# Patient Record
Sex: Female | Born: 1945
Health system: Southern US, Community
[De-identification: ages and names within clinical notes are randomized; demographics above are authoritative.]

## PROBLEM LIST (undated history)

## (undated) DIAGNOSIS — F329 Major depressive disorder, single episode, unspecified: Secondary | ICD-10-CM

## (undated) DIAGNOSIS — H409 Unspecified glaucoma: Secondary | ICD-10-CM

## (undated) DIAGNOSIS — E119 Type 2 diabetes mellitus without complications: Secondary | ICD-10-CM

## (undated) DIAGNOSIS — Z83719 Family history of colon polyps, unspecified: Secondary | ICD-10-CM

## (undated) DIAGNOSIS — J45909 Unspecified asthma, uncomplicated: Secondary | ICD-10-CM

## (undated) DIAGNOSIS — K219 Gastro-esophageal reflux disease without esophagitis: Secondary | ICD-10-CM

## (undated) DIAGNOSIS — F32A Depression, unspecified: Secondary | ICD-10-CM

## (undated) DIAGNOSIS — Z8371 Family history of colonic polyps: Secondary | ICD-10-CM

## (undated) DIAGNOSIS — M1990 Unspecified osteoarthritis, unspecified site: Secondary | ICD-10-CM

## (undated) DIAGNOSIS — I10 Essential (primary) hypertension: Secondary | ICD-10-CM

## (undated) HISTORY — DX: Essential (primary) hypertension: I10

## (undated) HISTORY — PX: JOINT REPLACEMENT: SHX530

## (undated) HISTORY — DX: Gastro-esophageal reflux disease without esophagitis: K21.9

## (undated) HISTORY — PX: TUBAL LIGATION: SHX77

## (undated) HISTORY — DX: Family history of colon polyps, unspecified: Z83.719

## (undated) HISTORY — DX: Unspecified osteoarthritis, unspecified site: M19.90

## (undated) HISTORY — DX: Depression, unspecified: F32.A

## (undated) HISTORY — DX: Family history of colonic polyps: Z83.71

## (undated) HISTORY — PX: BUNIONECTOMY: SHX129

## (undated) HISTORY — DX: Unspecified glaucoma: H40.9

## (undated) HISTORY — DX: Major depressive disorder, single episode, unspecified: F32.9

## (undated) HISTORY — PX: CATARACT EXTRACTION W/ INTRAOCULAR LENS IMPLANT: SHX1309

---

## 2013-09-10 HISTORY — PX: SHOULDER ARTHROSCOPY W/ ROTATOR CUFF REPAIR: SHX2400

## 2013-09-10 HISTORY — PX: REPLACEMENT TOTAL KNEE: SUR1224

## 2016-10-23 DIAGNOSIS — M25552 Pain in left hip: Secondary | ICD-10-CM | POA: Diagnosis not present

## 2016-10-23 DIAGNOSIS — M7062 Trochanteric bursitis, left hip: Secondary | ICD-10-CM | POA: Diagnosis not present

## 2016-10-24 DIAGNOSIS — Z6829 Body mass index (BMI) 29.0-29.9, adult: Secondary | ICD-10-CM | POA: Diagnosis not present

## 2016-10-24 DIAGNOSIS — Z713 Dietary counseling and surveillance: Secondary | ICD-10-CM | POA: Diagnosis not present

## 2016-11-19 DIAGNOSIS — H2512 Age-related nuclear cataract, left eye: Secondary | ICD-10-CM | POA: Diagnosis not present

## 2016-11-19 DIAGNOSIS — H2511 Age-related nuclear cataract, right eye: Secondary | ICD-10-CM | POA: Diagnosis not present

## 2016-11-19 DIAGNOSIS — H40013 Open angle with borderline findings, low risk, bilateral: Secondary | ICD-10-CM | POA: Diagnosis not present

## 2016-11-19 DIAGNOSIS — H524 Presbyopia: Secondary | ICD-10-CM | POA: Diagnosis not present

## 2016-12-04 DIAGNOSIS — Z01818 Encounter for other preprocedural examination: Secondary | ICD-10-CM | POA: Diagnosis not present

## 2016-12-04 DIAGNOSIS — Z6829 Body mass index (BMI) 29.0-29.9, adult: Secondary | ICD-10-CM | POA: Diagnosis not present

## 2016-12-04 DIAGNOSIS — Z6832 Body mass index (BMI) 32.0-32.9, adult: Secondary | ICD-10-CM | POA: Diagnosis not present

## 2016-12-04 DIAGNOSIS — Z01810 Encounter for preprocedural cardiovascular examination: Secondary | ICD-10-CM | POA: Diagnosis not present

## 2016-12-04 DIAGNOSIS — Z713 Dietary counseling and surveillance: Secondary | ICD-10-CM | POA: Diagnosis not present

## 2016-12-04 DIAGNOSIS — Z719 Counseling, unspecified: Secondary | ICD-10-CM | POA: Diagnosis not present

## 2016-12-10 DIAGNOSIS — H2512 Age-related nuclear cataract, left eye: Secondary | ICD-10-CM | POA: Diagnosis not present

## 2016-12-10 DIAGNOSIS — H2511 Age-related nuclear cataract, right eye: Secondary | ICD-10-CM | POA: Diagnosis not present

## 2016-12-13 DIAGNOSIS — J029 Acute pharyngitis, unspecified: Secondary | ICD-10-CM | POA: Diagnosis not present

## 2016-12-13 DIAGNOSIS — Z6829 Body mass index (BMI) 29.0-29.9, adult: Secondary | ICD-10-CM | POA: Diagnosis not present

## 2016-12-13 DIAGNOSIS — Z713 Dietary counseling and surveillance: Secondary | ICD-10-CM | POA: Diagnosis not present

## 2016-12-18 DIAGNOSIS — H2511 Age-related nuclear cataract, right eye: Secondary | ICD-10-CM | POA: Diagnosis not present

## 2016-12-19 DIAGNOSIS — Z961 Presence of intraocular lens: Secondary | ICD-10-CM | POA: Diagnosis not present

## 2016-12-26 DIAGNOSIS — Z961 Presence of intraocular lens: Secondary | ICD-10-CM | POA: Diagnosis not present

## 2017-01-16 DIAGNOSIS — H209 Unspecified iridocyclitis: Secondary | ICD-10-CM | POA: Diagnosis not present

## 2017-01-16 DIAGNOSIS — Z961 Presence of intraocular lens: Secondary | ICD-10-CM | POA: Diagnosis not present

## 2017-01-18 DIAGNOSIS — D482 Neoplasm of uncertain behavior of peripheral nerves and autonomic nervous system: Secondary | ICD-10-CM | POA: Diagnosis not present

## 2017-01-18 DIAGNOSIS — E1165 Type 2 diabetes mellitus with hyperglycemia: Secondary | ICD-10-CM | POA: Diagnosis not present

## 2017-01-18 DIAGNOSIS — I8390 Asymptomatic varicose veins of unspecified lower extremity: Secondary | ICD-10-CM | POA: Diagnosis not present

## 2017-01-18 DIAGNOSIS — E1142 Type 2 diabetes mellitus with diabetic polyneuropathy: Secondary | ICD-10-CM | POA: Diagnosis not present

## 2017-01-18 DIAGNOSIS — E7800 Pure hypercholesterolemia, unspecified: Secondary | ICD-10-CM | POA: Diagnosis not present

## 2017-01-18 DIAGNOSIS — H6693 Otitis media, unspecified, bilateral: Secondary | ICD-10-CM | POA: Diagnosis not present

## 2017-01-18 DIAGNOSIS — D869 Sarcoidosis, unspecified: Secondary | ICD-10-CM | POA: Diagnosis not present

## 2017-01-18 DIAGNOSIS — I10 Essential (primary) hypertension: Secondary | ICD-10-CM | POA: Diagnosis not present

## 2017-01-18 DIAGNOSIS — M179 Osteoarthritis of knee, unspecified: Secondary | ICD-10-CM | POA: Diagnosis not present

## 2017-01-18 DIAGNOSIS — E785 Hyperlipidemia, unspecified: Secondary | ICD-10-CM | POA: Diagnosis not present

## 2017-01-18 DIAGNOSIS — R21 Rash and other nonspecific skin eruption: Secondary | ICD-10-CM | POA: Diagnosis not present

## 2017-02-05 DIAGNOSIS — Z131 Encounter for screening for diabetes mellitus: Secondary | ICD-10-CM | POA: Diagnosis not present

## 2017-02-05 DIAGNOSIS — D869 Sarcoidosis, unspecified: Secondary | ICD-10-CM | POA: Diagnosis not present

## 2017-02-05 DIAGNOSIS — Z713 Dietary counseling and surveillance: Secondary | ICD-10-CM | POA: Diagnosis not present

## 2017-02-05 DIAGNOSIS — R21 Rash and other nonspecific skin eruption: Secondary | ICD-10-CM | POA: Diagnosis not present

## 2017-02-05 DIAGNOSIS — H6693 Otitis media, unspecified, bilateral: Secondary | ICD-10-CM | POA: Diagnosis not present

## 2017-02-05 DIAGNOSIS — Z6830 Body mass index (BMI) 30.0-30.9, adult: Secondary | ICD-10-CM | POA: Diagnosis not present

## 2017-02-05 DIAGNOSIS — E785 Hyperlipidemia, unspecified: Secondary | ICD-10-CM | POA: Diagnosis not present

## 2017-02-05 DIAGNOSIS — F325 Major depressive disorder, single episode, in full remission: Secondary | ICD-10-CM | POA: Diagnosis not present

## 2017-02-05 DIAGNOSIS — I10 Essential (primary) hypertension: Secondary | ICD-10-CM | POA: Diagnosis not present

## 2017-02-05 DIAGNOSIS — D482 Neoplasm of uncertain behavior of peripheral nerves and autonomic nervous system: Secondary | ICD-10-CM | POA: Diagnosis not present

## 2017-02-05 DIAGNOSIS — Z0001 Encounter for general adult medical examination with abnormal findings: Secondary | ICD-10-CM | POA: Diagnosis not present

## 2017-02-05 DIAGNOSIS — I8390 Asymptomatic varicose veins of unspecified lower extremity: Secondary | ICD-10-CM | POA: Diagnosis not present

## 2017-02-05 DIAGNOSIS — H409 Unspecified glaucoma: Secondary | ICD-10-CM | POA: Diagnosis not present

## 2017-02-11 DIAGNOSIS — Z6830 Body mass index (BMI) 30.0-30.9, adult: Secondary | ICD-10-CM | POA: Diagnosis not present

## 2017-02-11 DIAGNOSIS — Z87891 Personal history of nicotine dependence: Secondary | ICD-10-CM | POA: Diagnosis not present

## 2017-02-11 DIAGNOSIS — I10 Essential (primary) hypertension: Secondary | ICD-10-CM | POA: Diagnosis not present

## 2017-02-11 DIAGNOSIS — M549 Dorsalgia, unspecified: Secondary | ICD-10-CM | POA: Diagnosis not present

## 2017-02-11 DIAGNOSIS — Z713 Dietary counseling and surveillance: Secondary | ICD-10-CM | POA: Diagnosis not present

## 2017-03-05 DIAGNOSIS — M75122 Complete rotator cuff tear or rupture of left shoulder, not specified as traumatic: Secondary | ICD-10-CM | POA: Diagnosis not present

## 2017-03-05 DIAGNOSIS — M7542 Impingement syndrome of left shoulder: Secondary | ICD-10-CM | POA: Diagnosis not present

## 2017-03-05 DIAGNOSIS — M19012 Primary osteoarthritis, left shoulder: Secondary | ICD-10-CM | POA: Diagnosis not present

## 2017-03-06 DIAGNOSIS — H04122 Dry eye syndrome of left lacrimal gland: Secondary | ICD-10-CM | POA: Diagnosis not present

## 2017-03-06 DIAGNOSIS — H11443 Conjunctival cysts, bilateral: Secondary | ICD-10-CM | POA: Diagnosis not present

## 2017-03-06 DIAGNOSIS — Z961 Presence of intraocular lens: Secondary | ICD-10-CM | POA: Diagnosis not present

## 2017-03-06 DIAGNOSIS — H524 Presbyopia: Secondary | ICD-10-CM | POA: Diagnosis not present

## 2017-03-20 DIAGNOSIS — M654 Radial styloid tenosynovitis [de Quervain]: Secondary | ICD-10-CM | POA: Diagnosis not present

## 2017-03-28 DIAGNOSIS — Z96651 Presence of right artificial knee joint: Secondary | ICD-10-CM | POA: Diagnosis not present

## 2017-04-02 DIAGNOSIS — Z961 Presence of intraocular lens: Secondary | ICD-10-CM | POA: Diagnosis not present

## 2017-04-02 DIAGNOSIS — M7542 Impingement syndrome of left shoulder: Secondary | ICD-10-CM | POA: Diagnosis not present

## 2017-04-02 DIAGNOSIS — M75122 Complete rotator cuff tear or rupture of left shoulder, not specified as traumatic: Secondary | ICD-10-CM | POA: Diagnosis not present

## 2017-04-02 DIAGNOSIS — H11443 Conjunctival cysts, bilateral: Secondary | ICD-10-CM | POA: Diagnosis not present

## 2017-04-02 DIAGNOSIS — M19012 Primary osteoarthritis, left shoulder: Secondary | ICD-10-CM | POA: Diagnosis not present

## 2017-04-02 DIAGNOSIS — H40013 Open angle with borderline findings, low risk, bilateral: Secondary | ICD-10-CM | POA: Diagnosis not present

## 2017-04-02 DIAGNOSIS — H04123 Dry eye syndrome of bilateral lacrimal glands: Secondary | ICD-10-CM | POA: Diagnosis not present

## 2017-04-04 DIAGNOSIS — S0502XA Injury of conjunctiva and corneal abrasion without foreign body, left eye, initial encounter: Secondary | ICD-10-CM | POA: Diagnosis not present

## 2017-06-11 DIAGNOSIS — M7542 Impingement syndrome of left shoulder: Secondary | ICD-10-CM | POA: Diagnosis not present

## 2017-06-11 DIAGNOSIS — M75122 Complete rotator cuff tear or rupture of left shoulder, not specified as traumatic: Secondary | ICD-10-CM | POA: Diagnosis not present

## 2017-06-21 DIAGNOSIS — Z23 Encounter for immunization: Secondary | ICD-10-CM | POA: Diagnosis not present

## 2017-07-12 DIAGNOSIS — Z131 Encounter for screening for diabetes mellitus: Secondary | ICD-10-CM | POA: Diagnosis not present

## 2017-07-12 DIAGNOSIS — M2550 Pain in unspecified joint: Secondary | ICD-10-CM | POA: Diagnosis not present

## 2017-07-12 DIAGNOSIS — I10 Essential (primary) hypertension: Secondary | ICD-10-CM | POA: Diagnosis not present

## 2017-07-12 DIAGNOSIS — E7800 Pure hypercholesterolemia, unspecified: Secondary | ICD-10-CM | POA: Diagnosis not present

## 2017-07-12 DIAGNOSIS — E785 Hyperlipidemia, unspecified: Secondary | ICD-10-CM | POA: Diagnosis not present

## 2017-07-12 DIAGNOSIS — E119 Type 2 diabetes mellitus without complications: Secondary | ICD-10-CM | POA: Diagnosis not present

## 2017-07-16 DIAGNOSIS — I10 Essential (primary) hypertension: Secondary | ICD-10-CM | POA: Diagnosis not present

## 2017-07-16 DIAGNOSIS — D869 Sarcoidosis, unspecified: Secondary | ICD-10-CM | POA: Diagnosis not present

## 2017-07-16 DIAGNOSIS — F325 Major depressive disorder, single episode, in full remission: Secondary | ICD-10-CM | POA: Diagnosis not present

## 2017-07-16 DIAGNOSIS — R21 Rash and other nonspecific skin eruption: Secondary | ICD-10-CM | POA: Diagnosis not present

## 2017-07-16 DIAGNOSIS — E785 Hyperlipidemia, unspecified: Secondary | ICD-10-CM | POA: Diagnosis not present

## 2017-07-16 DIAGNOSIS — H409 Unspecified glaucoma: Secondary | ICD-10-CM | POA: Diagnosis not present

## 2017-07-16 DIAGNOSIS — Z131 Encounter for screening for diabetes mellitus: Secondary | ICD-10-CM | POA: Diagnosis not present

## 2017-07-16 DIAGNOSIS — Z6830 Body mass index (BMI) 30.0-30.9, adult: Secondary | ICD-10-CM | POA: Diagnosis not present

## 2017-07-16 DIAGNOSIS — Z1231 Encounter for screening mammogram for malignant neoplasm of breast: Secondary | ICD-10-CM | POA: Diagnosis not present

## 2017-07-16 DIAGNOSIS — I8390 Asymptomatic varicose veins of unspecified lower extremity: Secondary | ICD-10-CM | POA: Diagnosis not present

## 2017-07-16 DIAGNOSIS — D482 Neoplasm of uncertain behavior of peripheral nerves and autonomic nervous system: Secondary | ICD-10-CM | POA: Diagnosis not present

## 2017-07-17 DIAGNOSIS — Z961 Presence of intraocular lens: Secondary | ICD-10-CM | POA: Diagnosis not present

## 2017-07-17 DIAGNOSIS — H11443 Conjunctival cysts, bilateral: Secondary | ICD-10-CM | POA: Diagnosis not present

## 2017-07-17 DIAGNOSIS — H16223 Keratoconjunctivitis sicca, not specified as Sjogren's, bilateral: Secondary | ICD-10-CM | POA: Diagnosis not present

## 2017-08-22 DIAGNOSIS — M19012 Primary osteoarthritis, left shoulder: Secondary | ICD-10-CM | POA: Diagnosis not present

## 2017-08-22 DIAGNOSIS — M7542 Impingement syndrome of left shoulder: Secondary | ICD-10-CM | POA: Diagnosis not present

## 2017-08-22 DIAGNOSIS — M25412 Effusion, left shoulder: Secondary | ICD-10-CM | POA: Diagnosis not present

## 2017-08-22 DIAGNOSIS — M25512 Pain in left shoulder: Secondary | ICD-10-CM | POA: Diagnosis not present

## 2017-09-17 DIAGNOSIS — M19012 Primary osteoarthritis, left shoulder: Secondary | ICD-10-CM | POA: Diagnosis not present

## 2017-09-17 DIAGNOSIS — I10 Essential (primary) hypertension: Secondary | ICD-10-CM | POA: Diagnosis not present

## 2017-09-17 DIAGNOSIS — M7542 Impingement syndrome of left shoulder: Secondary | ICD-10-CM | POA: Diagnosis not present

## 2017-10-02 DIAGNOSIS — F325 Major depressive disorder, single episode, in full remission: Secondary | ICD-10-CM | POA: Diagnosis not present

## 2017-10-02 DIAGNOSIS — Z6830 Body mass index (BMI) 30.0-30.9, adult: Secondary | ICD-10-CM | POA: Diagnosis not present

## 2017-10-02 DIAGNOSIS — Z713 Dietary counseling and surveillance: Secondary | ICD-10-CM | POA: Diagnosis not present

## 2017-10-07 DIAGNOSIS — D126 Benign neoplasm of colon, unspecified: Secondary | ICD-10-CM | POA: Diagnosis not present

## 2017-10-07 DIAGNOSIS — Z1211 Encounter for screening for malignant neoplasm of colon: Secondary | ICD-10-CM | POA: Diagnosis not present

## 2017-11-12 DIAGNOSIS — F325 Major depressive disorder, single episode, in full remission: Secondary | ICD-10-CM | POA: Diagnosis not present

## 2017-11-12 DIAGNOSIS — Z713 Dietary counseling and surveillance: Secondary | ICD-10-CM | POA: Diagnosis not present

## 2017-11-12 DIAGNOSIS — Z6831 Body mass index (BMI) 31.0-31.9, adult: Secondary | ICD-10-CM | POA: Diagnosis not present

## 2017-11-13 DIAGNOSIS — Z8719 Personal history of other diseases of the digestive system: Secondary | ICD-10-CM | POA: Diagnosis not present

## 2017-11-13 DIAGNOSIS — Z86010 Personal history of colonic polyps: Secondary | ICD-10-CM | POA: Diagnosis not present

## 2017-11-13 DIAGNOSIS — K6289 Other specified diseases of anus and rectum: Secondary | ICD-10-CM | POA: Diagnosis not present

## 2017-11-13 DIAGNOSIS — Z1211 Encounter for screening for malignant neoplasm of colon: Secondary | ICD-10-CM | POA: Diagnosis not present

## 2017-11-18 DIAGNOSIS — J018 Other acute sinusitis: Secondary | ICD-10-CM | POA: Diagnosis not present

## 2017-11-19 DIAGNOSIS — M654 Radial styloid tenosynovitis [de Quervain]: Secondary | ICD-10-CM | POA: Diagnosis not present

## 2017-11-19 DIAGNOSIS — M25531 Pain in right wrist: Secondary | ICD-10-CM | POA: Diagnosis not present

## 2017-11-28 DIAGNOSIS — D126 Benign neoplasm of colon, unspecified: Secondary | ICD-10-CM | POA: Diagnosis not present

## 2017-12-10 DIAGNOSIS — M5442 Lumbago with sciatica, left side: Secondary | ICD-10-CM | POA: Diagnosis not present

## 2017-12-10 DIAGNOSIS — M25552 Pain in left hip: Secondary | ICD-10-CM | POA: Diagnosis not present

## 2018-01-01 DIAGNOSIS — H25012 Cortical age-related cataract, left eye: Secondary | ICD-10-CM | POA: Diagnosis not present

## 2018-01-01 DIAGNOSIS — H16223 Keratoconjunctivitis sicca, not specified as Sjogren's, bilateral: Secondary | ICD-10-CM | POA: Diagnosis not present

## 2018-01-01 DIAGNOSIS — H2512 Age-related nuclear cataract, left eye: Secondary | ICD-10-CM | POA: Diagnosis not present

## 2018-01-01 DIAGNOSIS — H40011 Open angle with borderline findings, low risk, right eye: Secondary | ICD-10-CM | POA: Diagnosis not present

## 2018-01-01 DIAGNOSIS — H401122 Primary open-angle glaucoma, left eye, moderate stage: Secondary | ICD-10-CM | POA: Diagnosis not present

## 2018-01-01 DIAGNOSIS — Z961 Presence of intraocular lens: Secondary | ICD-10-CM | POA: Diagnosis not present

## 2018-01-01 DIAGNOSIS — E119 Type 2 diabetes mellitus without complications: Secondary | ICD-10-CM | POA: Diagnosis not present

## 2018-01-06 DIAGNOSIS — M5136 Other intervertebral disc degeneration, lumbar region: Secondary | ICD-10-CM | POA: Diagnosis not present

## 2018-01-06 DIAGNOSIS — M5137 Other intervertebral disc degeneration, lumbosacral region: Secondary | ICD-10-CM | POA: Diagnosis not present

## 2018-01-06 DIAGNOSIS — Z0000 Encounter for general adult medical examination without abnormal findings: Secondary | ICD-10-CM | POA: Diagnosis not present

## 2018-01-06 DIAGNOSIS — M48061 Spinal stenosis, lumbar region without neurogenic claudication: Secondary | ICD-10-CM | POA: Diagnosis not present

## 2018-01-06 DIAGNOSIS — M5127 Other intervertebral disc displacement, lumbosacral region: Secondary | ICD-10-CM | POA: Diagnosis not present

## 2018-01-06 DIAGNOSIS — M5126 Other intervertebral disc displacement, lumbar region: Secondary | ICD-10-CM | POA: Diagnosis not present

## 2018-01-09 DIAGNOSIS — M25552 Pain in left hip: Secondary | ICD-10-CM | POA: Diagnosis not present

## 2018-01-09 DIAGNOSIS — M5442 Lumbago with sciatica, left side: Secondary | ICD-10-CM | POA: Diagnosis not present

## 2018-01-14 DIAGNOSIS — M25552 Pain in left hip: Secondary | ICD-10-CM | POA: Diagnosis not present

## 2018-01-20 DIAGNOSIS — Z87891 Personal history of nicotine dependence: Secondary | ICD-10-CM | POA: Diagnosis not present

## 2018-01-20 DIAGNOSIS — E785 Hyperlipidemia, unspecified: Secondary | ICD-10-CM | POA: Diagnosis not present

## 2018-01-20 DIAGNOSIS — Z0001 Encounter for general adult medical examination with abnormal findings: Secondary | ICD-10-CM | POA: Diagnosis not present

## 2018-01-20 DIAGNOSIS — M179 Osteoarthritis of knee, unspecified: Secondary | ICD-10-CM | POA: Diagnosis not present

## 2018-01-20 DIAGNOSIS — M549 Dorsalgia, unspecified: Secondary | ICD-10-CM | POA: Diagnosis not present

## 2018-01-20 DIAGNOSIS — I8390 Asymptomatic varicose veins of unspecified lower extremity: Secondary | ICD-10-CM | POA: Diagnosis not present

## 2018-01-20 DIAGNOSIS — H409 Unspecified glaucoma: Secondary | ICD-10-CM | POA: Diagnosis not present

## 2018-01-20 DIAGNOSIS — Z6832 Body mass index (BMI) 32.0-32.9, adult: Secondary | ICD-10-CM | POA: Diagnosis not present

## 2018-01-20 DIAGNOSIS — D869 Sarcoidosis, unspecified: Secondary | ICD-10-CM | POA: Diagnosis not present

## 2018-01-20 DIAGNOSIS — I10 Essential (primary) hypertension: Secondary | ICD-10-CM | POA: Diagnosis not present

## 2018-01-20 DIAGNOSIS — F325 Major depressive disorder, single episode, in full remission: Secondary | ICD-10-CM | POA: Diagnosis not present

## 2018-01-20 DIAGNOSIS — Z713 Dietary counseling and surveillance: Secondary | ICD-10-CM | POA: Diagnosis not present

## 2018-01-21 DIAGNOSIS — H16223 Keratoconjunctivitis sicca, not specified as Sjogren's, bilateral: Secondary | ICD-10-CM | POA: Diagnosis not present

## 2018-01-21 DIAGNOSIS — H40011 Open angle with borderline findings, low risk, right eye: Secondary | ICD-10-CM | POA: Diagnosis not present

## 2018-01-21 DIAGNOSIS — H401122 Primary open-angle glaucoma, left eye, moderate stage: Secondary | ICD-10-CM | POA: Diagnosis not present

## 2018-02-12 DIAGNOSIS — M25412 Effusion, left shoulder: Secondary | ICD-10-CM | POA: Diagnosis not present

## 2018-02-12 DIAGNOSIS — M25512 Pain in left shoulder: Secondary | ICD-10-CM | POA: Diagnosis not present

## 2018-02-12 DIAGNOSIS — M75122 Complete rotator cuff tear or rupture of left shoulder, not specified as traumatic: Secondary | ICD-10-CM | POA: Diagnosis not present

## 2018-04-01 DIAGNOSIS — M7062 Trochanteric bursitis, left hip: Secondary | ICD-10-CM | POA: Diagnosis not present

## 2018-04-01 DIAGNOSIS — M25552 Pain in left hip: Secondary | ICD-10-CM | POA: Diagnosis not present

## 2018-04-16 DIAGNOSIS — M25512 Pain in left shoulder: Secondary | ICD-10-CM | POA: Diagnosis not present

## 2018-04-22 DIAGNOSIS — M7062 Trochanteric bursitis, left hip: Secondary | ICD-10-CM | POA: Diagnosis not present

## 2018-05-02 ENCOUNTER — Encounter: Payer: Self-pay | Admitting: Family Medicine

## 2018-05-02 ENCOUNTER — Ambulatory Visit (INDEPENDENT_AMBULATORY_CARE_PROVIDER_SITE_OTHER): Payer: Medicare Other | Admitting: Family Medicine

## 2018-05-02 VITALS — BP 110/64 | HR 86 | Temp 97.8°F | Ht 62.0 in | Wt 195.0 lb

## 2018-05-02 DIAGNOSIS — E118 Type 2 diabetes mellitus with unspecified complications: Secondary | ICD-10-CM | POA: Insufficient documentation

## 2018-05-02 DIAGNOSIS — Z9889 Other specified postprocedural states: Secondary | ICD-10-CM | POA: Insufficient documentation

## 2018-05-02 DIAGNOSIS — K219 Gastro-esophageal reflux disease without esophagitis: Secondary | ICD-10-CM | POA: Diagnosis not present

## 2018-05-02 DIAGNOSIS — M75102 Unspecified rotator cuff tear or rupture of left shoulder, not specified as traumatic: Secondary | ICD-10-CM

## 2018-05-02 DIAGNOSIS — Z7689 Persons encountering health services in other specified circumstances: Secondary | ICD-10-CM | POA: Diagnosis not present

## 2018-05-02 MED ORDER — OMEPRAZOLE 40 MG PO CPDR: 40.0000 mg | DELAYED_RELEASE_CAPSULE | Freq: Two times a day (BID) | ORAL | 3 refills | Status: DC

## 2018-05-02 NOTE — Progress Notes (Signed)
Patient presents to clinic today to establish care.  SUBJECTIVE: PMH: Pt is a 72 yo female with pmh sig for GERD, dry eyes, cataracts, diet-controlled diabetes, b/l rotator cuff injury.  Pt previously seen in [place], [place].  GERD: -Patient endorses somewhat frequent symptoms -Takes omeprazole twice daily. -Requesting refill as without medication.  Rotator cuff tears: -Right rotator cuff s/p repair. -needs repair of L rotator cuff.  To be done Oct 3rd by Dr. Onnie Graham? -pt notes discomfort becoming unbearable.  H/o DM II: -diet controlled.  HTN:  -taking telmisartan-HCT 80-12.5 mg daily -trying to eat better -endorses eating more fast food recently 2/2 moving.  Allergies:   Penicillin-hives, swelling  Social history: Pt is married.  She and her husband moved to [place] to be closer to their 99-year-old grand child.  Pt has a 65 year old grand son in [place].  Pt denies alcohol, tobacco, drug use.  Health Maintenance: Colonoscopy --February 2019 Mammogram --2018  Past Medical History:  Diagnosis Date  . Arthritis   . Depression   . Diabetes mellitus without complication ([place])   . Family history of polyps in the colon   . GERD (gastroesophageal reflux disease)   . Glaucoma   . Hypertension     Current Outpatient Medications on File Prior to Visit  Medication Sig Dispense Refill  . aspirin EC 81 MG tablet Take 81 mg by mouth daily.    . Cholecalciferol (VITAMIN D PO) Take 2,000 mg by mouth daily.    . Multiple Vitamins-Minerals (CENTRUM SILVER PO) Take by mouth daily at 12 noon.    . Probiotic Product (TRUNATURE DIGESTIVE PROBIOTIC) CAPS Take by mouth daily at 12 noon.    Marland Kitchen telmisartan-hydrochlorothiazide (MICARDIS HCT) 80-12.5 MG tablet Take 1 tablet by mouth daily.     No current facility-administered medications on file prior to visit.     Allergies  Allergen Reactions  . Penicillins Hives    No family history on file.  Social History    Socioeconomic History  . Marital status: Married    Spouse name: Not on file  . Number of children: Not on file  . Years of education: Not on file  . Highest education level: Not on file  Occupational History  . Not on file  Social Needs  . Financial resource strain: Not on file  . Food insecurity:    Worry: Not on file    Inability: Not on file  . Transportation needs:    Medical: Not on file    Non-medical: Not on file  Tobacco Use  . Smoking status: Never Smoker  . Smokeless tobacco: Never Used  Substance and Sexual Activity  . Alcohol use: Yes  . Drug use: Never  . Sexual activity: Yes  Lifestyle  . Physical activity:    Days per week: Not on file    Minutes per session: Not on file  . Stress: Not on file  Relationships  . Social connections:    Talks on phone: Not on file    Gets together: Not on file    Attends religious service: Not on file    Active member of club or organization: Not on file    Attends meetings of clubs or organizations: Not on file    Relationship status: Not on file  . Intimate partner violence:    Fear of current or ex partner: Not on file    Emotionally abused: Not on file    Physically abused: Not on file  Forced sexual activity: Not on file  Other Topics Concern  . Not on file  Social History Narrative  . Not on file    ROS General: Denies fever, chills, night sweats, changes in weight, changes in appetite HEENT: Denies headaches, ear pain, changes in vision, rhinorrhea, sore throat CV: Denies CP, palpitations, SOB, orthopnea Pulm: Denies SOB, cough, wheezing GI: Denies abdominal pain, nausea, vomiting, diarrhea, constipation GU: Denies dysuria, hematuria, frequency, vaginal discharge Msk: Denies muscle cramps, joint pains Neuro: Denies weakness, numbness, tingling Skin: Denies rashes, bruising Psych: Denies depression, anxiety, hallucinations  BP 110/64 (BP Location: Left Arm, Patient Position: Sitting, Cuff Size:  Large)   Pulse 86   Temp 97.8 F (36.6 C)   Ht 5\' 2"  (1.575 m)   Wt 195 lb (88.5 kg)   SpO2 97%   BMI 35.67 kg/m   Physical Exam Gen. Pleasant, well developed, well-nourished, in NAD HEENT - [place]/AT, PERRL, no scleral icterus, no nasal drainage, pharynx without erythema or exudate. Lungs: no use of accessory muscles, CTAB, no wheezes, rales or rhonchi Cardiovascular: RRR, No r/g/m, no peripheral edema Abdomen: BS present, soft, nontender, nondistended Neuro:  A&Ox3, CN II-XII intact, normal gait Skin:  Warm, dry, intact, no lesions  No results found for this or any previous visit (from the past 2160 hour(s)).  Assessment/Plan: Gastroesophageal reflux disease, esophagitis presence not specified  - Plan: omeprazole (PRILOSEC) 40 MG capsule  Tear of left rotator cuff, unspecified tear extent, unspecified whether traumatic -continue f/u with Ortho, Dr. Onnie Graham? -surgery scheduled Jun 12, 2018  Controlled type 2 diabetes mellitus with complication, without long-term current use of insulin ([place]) -encouraged to continue lifestyle modifications -will obtain hgb A1C in the next few months  Encounter to establish care -We reviewed the PMH, PSH, FH, SH, Meds and Allergies. -We provided refills for any medications we will prescribe as needed. -We addressed current concerns per orders and patient instructions. -We have asked for records for pertinent exams, studies, vaccines and notes from previous providers. -We have advised patient to follow up per instructions below.  F/u prn in the next few months  Grier Mitts, MD

## 2018-05-02 NOTE — Patient Instructions (Addendum)
Food Choices for Gastroesophageal Reflux Disease, Adult When you have gastroesophageal reflux disease (GERD), the foods you eat and your eating habits are very important. Choosing the right foods can help ease the discomfort of GERD. Consider working with a diet and nutrition specialist (dietitian) to help you make healthy food choices. What general guidelines should I follow? Eating plan  Choose healthy foods low in fat, such as fruits, vegetables, whole grains, low-fat dairy products, and lean meat, fish, and poultry.  Eat frequent, small meals instead of three large meals each day. Eat your meals slowly, in a relaxed setting. Avoid bending over or lying down until 2-3 hours after eating.  Limit high-fat foods such as fatty meats or fried foods.  Limit your intake of oils, butter, and shortening to less than 8 teaspoons each day.  Avoid the following: ? Foods that cause symptoms. These may be different for different people. Keep a food diary to keep track of foods that cause symptoms. ? Alcohol. ? Drinking large amounts of liquid with meals. ? Eating meals during the 2-3 hours before bed.  Cook foods using methods other than frying. This may include baking, grilling, or broiling. Lifestyle   Maintain a healthy weight. Ask your health care provider what weight is healthy for you. If you need to lose weight, work with your health care provider to do so safely.  Exercise for at least 30 minutes on 5 or more days each week, or as told by your health care provider.  Avoid wearing clothes that fit tightly around your waist and chest.  Do not use any products that contain nicotine or tobacco, such as cigarettes and e-cigarettes. If you need help quitting, ask your health care provider.  Sleep with the head of your bed raised. Use a wedge under the mattress or blocks under the bed frame to raise the head of the bed. What foods are not recommended? The items listed may not be a complete  list. Talk with your dietitian about what dietary choices are best for you. Grains Pastries or quick breads with added fat. French toast. Vegetables Deep fried vegetables. French fries. Any vegetables prepared with added fat. Any vegetables that cause symptoms. For some people this may include tomatoes and tomato products, chili peppers, onions and garlic, and horseradish. Fruits Any fruits prepared with added fat. Any fruits that cause symptoms. For some people this may include citrus fruits, such as oranges, grapefruit, pineapple, and lemons. Meats and other protein foods High-fat meats, such as fatty beef or pork, hot dogs, ribs, ham, sausage, salami and bacon. Fried meat or protein, including fried fish and fried chicken. Nuts and nut butters. Dairy Whole milk and chocolate milk. Sour cream. Cream. Ice cream. Cream cheese. Milk shakes. Beverages Coffee and tea, with or without caffeine. Carbonated beverages. Sodas. Energy drinks. Fruit juice made with acidic fruits (such as orange or grapefruit). Tomato juice. Alcoholic drinks. Fats and oils Butter. Margarine. Shortening. Ghee. Sweets and desserts Chocolate and cocoa. Donuts. Seasoning and other foods Pepper. Peppermint and spearmint. Any condiments, herbs, or seasonings that cause symptoms. For some people, this may include curry, hot sauce, or vinegar-based salad dressings. Summary  When you have gastroesophageal reflux disease (GERD), food and lifestyle choices are very important to help ease the discomfort of GERD.  Eat frequent, small meals instead of three large meals each day. Eat your meals slowly, in a relaxed setting. Avoid bending over or lying down until 2-3 hours after eating.  Limit high-fat   foods such as fatty meat or fried foods. This information is not intended to replace advice given to you by your health care provider. Make sure you discuss any questions you have with your health care provider. Document Released:  08/27/2005 Document Revised: 08/28/2016 Document Reviewed: 08/28/2016 Elsevier Interactive Patient Education  2018 [employer].  How to Avoid Diabetes Mellitus Problems You can take action to prevent or slow down problems that are caused by diabetes (diabetes mellitus). Following your diabetes plan and taking care of yourself can reduce your risk of serious or life-threatening complications. Manage your diabetes  Follow instructions from your health care providers about managing your diabetes. Your diabetes may be managed by a team of health care providers who can teach you how to care for yourself and can answer questions that you have.  Educate yourself about your condition so you can make healthy choices about eating and physical activity.  Check your blood sugar (glucose) levels as often as directed. Your health care provider will help you decide how often to check your blood glucose level depending on your treatment goals and how well you are meeting them.  Ask your health care provider if you should take low-dose aspirin daily and what dose is recommended for you. Taking low-dose aspirin daily is recommended to help prevent cardiovascular disease. Do not use nicotine or tobacco Do not use any products that contain nicotine or tobacco, such as cigarettes and e-cigarettes. If you need help quitting, ask your health care provider. Nicotine raises your risk for diabetes problems. If you quit using nicotine:  You will lower your risk for heart attack, stroke, nerve disease, and kidney disease.  Your cholesterol and blood pressure may improve.  Your blood circulation will improve.  Keep your blood pressure under control To control your blood pressure:  Follow instructions from your health care provider about meal planning, exercise, and medicines.  Make sure your health care provider checks your blood pressure at every medical visit.  A blood pressure reading consists of two numbers.  Generally, the goal is to keep your top number (systolic pressure) at or below 130, and your bottom number (diastolic pressure) at or below 80. Your health care provider may recommend a lower target blood pressure. Your individualized target blood pressure is determined based on:  Your age.  Your medicines.  How long you have had diabetes.  Any other medical conditions you have.  Keep your cholesterol under control To control your cholesterol:  Follow instructions from your health care provider about meal planning, exercise, and medicines.  Have your cholesterol checked at least once a year.  You may be prescribed medicine to lower cholesterol (statin). If you are not taking a statin, ask your health care provider if you should be.  Controlling your cholesterol may:  Help prevent heart disease and stroke. These are the most common health problems for people with diabetes.  Improve your blood flow.  Schedule and keep yearly physical exams and eye exams Your health care provider will tell you how often you need medical visits depending on your diabetes management plan. Keep all follow-up visits as directed. This is important so possible problems can be identified early and complications can be avoided or treated.  Every visit with your health care provider should include measuring your: ? Weight. ? Blood pressure. ? Blood glucose control.  Your A1c (hemoglobin A1c) level should be checked: ? At least 2 times a year, if you are meeting your treatment goals. ?  4 times a year, if you are not meeting treatment goals or if your treatment goals have changed.  Your blood lipids (lipid profile) should be checked yearly. You should also be checked yearly for protein in your urine (urine microalbumin).  If you have type 1 diabetes, get an eye exam 3-5 years after you are diagnosed, and then once a year after your first exam.  If you have type 2 diabetes, get an eye exam as soon as you  are diagnosed, and then once a year after your first exam.  Keep your vaccines current It is recommended that you receive:  A flu (influenza) vaccine every year.  A pneumonia (pneumococcal) vaccine and a hepatitis B vaccine. If you are age 15 or older, you may get the pneumonia vaccine as a series of two separate shots.  Ask your health care provider which other vaccines may be recommended. Take care of your feet Diabetes may cause you to have poor blood circulation to your legs and feet. Because of this, taking care of your feet is very important. Diabetes can cause:  The skin on the feet to get thinner, break more easily, and heal more slowly.  Nerve damage in your legs and feet, which results in decreased feeling. You may not notice minor injuries that could lead to serious problems.  To avoid foot problems:  Check your skin and feet every day for cuts, bruises, redness, blisters, or sores.  Schedule a foot exam with your health care provider once every year. This exam includes: ? Inspecting of the structure and skin of your feet. ? Checking the pulses and sensation in your feet.  Make sure that your health care provider performs a visual foot exam at every medical visit.  Take care of your teeth People with poorly controlled diabetes are more likely to have gum (periodontal) disease. Diabetes can make periodontal diseases harder to control. If not treated, periodontal diseases can lead to tooth loss. To prevent this:  Brush your teeth twice a day.  Floss at least once a day.  Visit your dentist 2 times a year.  Drink responsibly Limit alcohol intake to no more than 1 drink a day for nonpregnant women and 2 drinks a day for men. One drink equals 12 oz of beer, 5 oz of wine, or 1 oz of hard liquor. It is important to eat food when you drink alcohol to avoid low blood glucose (hypoglycemia). Avoid alcohol if you:  Have a history of alcohol abuse or dependence.  Are  pregnant.  Have liver disease, pancreatitis, advanced neuropathy, or severe hypertriglyceridemia.  Lessen stress Living with diabetes can be stressful. When you are experiencing stress, your blood glucose may be affected in two ways:  Stress hormones may cause your blood glucose to rise.  You may be distracted from taking good care of yourself.  Be aware of your stress level and make changes to help you manage challenging situations. To lower your stress levels:  Consider joining a support group.  Do planned relaxation or meditation.  Do a hobby that you enjoy.  Maintain healthy relationships.  Exercise regularly.  Work with your health care provider or a mental health professional.  Summary  You can take action to prevent or slow down problems that are caused by diabetes (diabetes mellitus). Following your diabetes plan and taking care of yourself can reduce your risk of serious or life-threatening complications.  Follow instructions from your health care providers about managing your diabetes. Your  diabetes may be managed by a team of health care providers who can teach you how to care for yourself and can answer questions that you have.  Your health care provider will tell you how often you need medical visits depending on your diabetes management plan. Keep all follow-up visits as directed. This is important so possible problems can be identified early and complications can be avoided or treated. This information is not intended to replace advice given to you by your health care provider. Make sure you discuss any questions you have with your health care provider. Document Released: 05/15/2011 Document Revised: 05/26/2016 Document Reviewed: 05/26/2016 Elsevier Interactive Patient Education  2018 [employer].  Exercising to [employer] Exercising can help you to lose weight. In order to lose weight through exercise, you need to do vigorous-intensity exercise. You can tell  that you are exercising with vigorous intensity if you are breathing very hard and fast and cannot hold a conversation while exercising. Moderate-intensity exercise helps to maintain your current weight. You can tell that you are exercising at a moderate level if you have a higher heart rate and faster breathing, but you are still able to hold a conversation. How often should I exercise? Choose an activity that you enjoy and set realistic goals. Your health care provider can help you to make an activity plan that works for you. Exercise regularly as directed by your health care provider. This may include:  Doing resistance training twice each week, such as: ? Push-ups. ? Sit-ups. ? Lifting weights. ? Using resistance bands.  Doing a given intensity of exercise for a given amount of time. Choose from these options: ? 150 minutes of moderate-intensity exercise every week. ? 75 minutes of vigorous-intensity exercise every week. ? A mix of moderate-intensity and vigorous-intensity exercise every week.  Children, pregnant women, people who are out of shape, people who are overweight, and older adults may need to consult a health care provider for individual recommendations. If you have any sort of medical condition, be sure to consult your health care provider before starting a new exercise program. What are some activities that can help me to lose weight?  Walking at a rate of at least 4.5 miles an hour.  Jogging or running at a rate of 5 miles per hour.  Biking at a rate of at least 10 miles per hour.  Lap swimming.  Roller-skating or in-line skating.  Cross-country skiing.  Vigorous competitive sports, such as football, basketball, and soccer.  Jumping rope.  Aerobic dancing. How can I be more active in my day-to-day activities?  Use the stairs instead of the elevator.  Take a walk during your lunch break.  If you drive, park your car farther away from work or school.  If  you take public transportation, get off one stop early and walk the rest of the way.  Make all of your phone calls while standing up and walking around.  Get up, stretch, and walk around every 30 minutes throughout the day. What guidelines should I follow while exercising?  Do not exercise so much that you hurt yourself, feel dizzy, or get very short of breath.  Consult your health care provider prior to starting a new exercise program.  Wear comfortable clothes and shoes with good support.  Drink plenty of water while you exercise to prevent dehydration or heat stroke. Body water is lost during exercise and must be replaced.  Work out until you breathe faster and your heart  beats faster. This information is not intended to replace advice given to you by your health care provider. Make sure you discuss any questions you have with your health care provider. Document Released: 09/29/2010 Document Revised: 02/02/2016 Document Reviewed: 01/28/2014 Elsevier Interactive Patient Education  [employer].

## 2018-05-19 DIAGNOSIS — M7062 Trochanteric bursitis, left hip: Secondary | ICD-10-CM | POA: Diagnosis not present

## 2018-05-19 DIAGNOSIS — M25552 Pain in left hip: Secondary | ICD-10-CM | POA: Diagnosis not present

## 2018-05-29 NOTE — Pre-Procedure Instructions (Signed)
LUELLA GARDENHIRE  05/29/2018      [facility] [address], [place] [address] Phone: [phone] Fax: [phone]    Your procedure is scheduled on June 12, 2018.  Report to [facility] Admitting at 530 AM.  Call this number if you have problems the morning of surgery:  [phone]   Remember:  Do not eat or drink after midnight.    Take these medicines the morning of surgery with A SIP OF WATER  Omeprazole (prilosec)  Follow your surgeon's instructions on when to hold/resume aspirin.  If no instructions were given call the office to determine how they would like to you take aspirin  7 days prior to surgery STOP taking any meloxicam (mobic), Aleve, Naproxen, Ibuprofen, Motrin, Advil, Goody's, BC's, all herbal medications, fish oil, and all vitamins    Do not wear jewelry, make-up or nail polish.  Do not wear lotions, powders, or perfumes, or deodorant.  Do not shave 48 hours prior to surgery.    Do not bring valuables to the hospital.  [facility] is not responsible for any belongings or valuables.  Contacts, dentures or bridgework may not be worn into surgery.  Leave your suitcase in the car.  After surgery it may be brought to your room.  For patients admitted to the hospital, discharge time will be determined by your treatment team.  Patients discharged the day of surgery will not be allowed to drive home.    - Preparing For Surgery  Before surgery, you can play an important role. Because skin is not sterile, your skin needs to be as free of germs as possible. You can reduce the number of germs on your skin by washing with CHG (chlorahexidine gluconate) Soap before surgery.  CHG is an antiseptic cleaner which kills germs and bonds with the skin to continue killing germs even after washing.    Oral Hygiene is also important to reduce your risk of infection.  Remember -  BRUSH YOUR TEETH THE MORNING OF SURGERY WITH YOUR REGULAR TOOTHPASTE  Please do not use if you have an allergy to CHG or antibacterial soaps. If your skin becomes reddened/irritated stop using the CHG.  Do not shave (including legs and underarms) for at least 48 hours prior to first CHG shower. It is OK to shave your face.  Please follow these instructions carefully.   1. Shower the NIGHT BEFORE SURGERY and the MORNING OF SURGERY with CHG.   2. If you chose to wash your hair, wash your hair first as usual with your normal shampoo.  3. After you shampoo, rinse your hair and body thoroughly to remove the shampoo.  4. Use CHG as you would any other liquid soap. You can apply CHG directly to the skin and wash gently with a scrungie or a clean washcloth.   5. Apply the CHG Soap to your body ONLY FROM THE NECK DOWN.  Do not use on open wounds or open sores. Avoid contact with your eyes, ears, mouth and genitals (private parts). Wash Face and genitals (private parts)  with your normal soap.  6. Wash thoroughly, paying special attention to the area where your surgery will be performed.  7. Thoroughly rinse your body with warm water from the neck down.  8. DO NOT shower/wash with your normal soap after using and rinsing off the CHG Soap.  9. Pat yourself dry with a  CLEAN TOWEL.  10. Wear CLEAN PAJAMAS to bed the night before surgery, wear comfortable clothes the morning of surgery  11. Place CLEAN SHEETS on your bed the night of your first shower and DO NOT SLEEP WITH PETS.  Day of Surgery:  Do not apply any deodorants/lotions.  Please wear clean clothes to the hospital/surgery center.   Remember to brush your teeth WITH YOUR REGULAR TOOTHPASTE.  Please read over the following fact sheets that you were given. Pain Booklet, Coughing and Deep Breathing, MRSA Information and Surgical Site Infection Prevention

## 2018-05-30 ENCOUNTER — Other Ambulatory Visit: Payer: Self-pay

## 2018-05-30 ENCOUNTER — Encounter (HOSPITAL_COMMUNITY): Payer: Self-pay | Admitting: Urology

## 2018-05-30 ENCOUNTER — Encounter (HOSPITAL_COMMUNITY)
Admission: RE | Admit: 2018-05-30 | Discharge: 2018-05-30 | Disposition: A | Discharge status: Home / Self Care | Payer: Medicare Other | Source: Ambulatory Visit | Attending: Orthopedic Surgery | Admitting: Orthopedic Surgery

## 2018-05-30 DIAGNOSIS — Z79899 Other long term (current) drug therapy: Secondary | ICD-10-CM | POA: Insufficient documentation

## 2018-05-30 DIAGNOSIS — Z7982 Long term (current) use of aspirin: Secondary | ICD-10-CM | POA: Insufficient documentation

## 2018-05-30 DIAGNOSIS — I10 Essential (primary) hypertension: Secondary | ICD-10-CM | POA: Diagnosis not present

## 2018-05-30 DIAGNOSIS — K219 Gastro-esophageal reflux disease without esophagitis: Secondary | ICD-10-CM | POA: Diagnosis not present

## 2018-05-30 DIAGNOSIS — R7303 Prediabetes: Secondary | ICD-10-CM | POA: Diagnosis not present

## 2018-05-30 DIAGNOSIS — Z01818 Encounter for other preprocedural examination: Secondary | ICD-10-CM | POA: Diagnosis not present

## 2018-05-30 DIAGNOSIS — M75102 Unspecified rotator cuff tear or rupture of left shoulder, not specified as traumatic: Secondary | ICD-10-CM | POA: Insufficient documentation

## 2018-05-30 LAB — CBC
HEMATOCRIT: 43.3 % (ref 36.0–46.0)
HEMOGLOBIN: 13.7 g/dL (ref 12.0–15.0)
MCH: 28.3 pg (ref 26.0–34.0)
MCHC: 31.6 g/dL (ref 30.0–36.0)
MCV: 89.5 fL (ref 78.0–100.0)
Platelets: 295 10*3/uL (ref 150–400)
RBC: 4.84 MIL/uL (ref 3.87–5.11)
RDW: 13.7 % (ref 11.5–15.5)
WBC: 8.1 10*3/uL (ref 4.0–10.5)

## 2018-05-30 LAB — BASIC METABOLIC PANEL
ANION GAP: 9 (ref 5–15)
BUN: 20 mg/dL (ref 8–23)
CALCIUM: 9.4 mg/dL (ref 8.9–10.3)
CHLORIDE: 103 mmol/L (ref 98–111)
CO2: 28 mmol/L (ref 22–32)
Creatinine, Ser: 0.66 mg/dL (ref 0.44–1.00)
GFR calc Af Amer: >60 mL/min (ref >60)
GFR calc non Af Amer: >60 mL/min (ref >60)
GLUCOSE: 109 mg/dL — AB (ref 70–99)
Potassium: 4.1 mmol/L (ref 3.5–5.1)
Sodium: 140 mmol/L (ref 135–145)

## 2018-05-30 LAB — SURGICAL PCR SCREEN
MRSA, PCR: NEGATIVE
Staphylococcus aureus: NEGATIVE

## 2018-05-30 LAB — HEMOGLOBIN A1C
Hgb A1c MFr Bld: 5.7 % — ABNORMAL HIGH (ref 4.8–5.6)
MEAN PLASMA GLUCOSE: 116.89 mg/dL

## 2018-05-30 LAB — GLUCOSE, CAPILLARY: Glucose-Capillary: 95 mg/dL (ref 70–99)

## 2018-05-30 NOTE — Progress Notes (Signed)
PCP - Edwinna Areola- previous PCP in [facility] (last office note and EKG requested), pt recently moved to [facility] PCP listed as [place] Cardiologist [facility]- pt has not acquired cardiologist here in [place], last office note and cardiac studies requested. Pt reports no cardiac issues and she was worked up with cardiac tests d/t need for knee replacement with Dr. Philomena Doheny in [place]  EKG - 05/30/2018, requested last tracing from PCP and cardiologist in [place] - requested ECHO - requested Cardiac Cath - denies  Pt does not check CBG at home and is not on DM medications. Pt reports A1c is done every 6 months  Aspirin Instructions: pt to contact Dr. Onnie Graham regarding instructions on Aspirin  Anesthesia review: follow up cardiac records.   Patient denies shortness of breath, fever, cough and chest pain at PAT appointment   Patient verbalized understanding of instructions that were given to them at the PAT appointment. Patient was also instructed that they will need to review over the PAT instructions again at home before surgery.

## 2018-06-02 NOTE — Progress Notes (Addendum)
Anesthesia Chart Review:  Case:  [id number] Date/Time:  06/12/18 0715   Procedure:  LEFT REVERSE SHOULDER ARTHROPLASTY (Left Shoulder)   Anesthesia type:  General   Pre-op diagnosis:  Left shoulder rotator cuff tear arthropathy   Location:  [facility] OR ROOM 06 / [place] [facility]   Surgeon:  Justice Britain, MD      DISCUSSION: Patient is a 72 year old female scheduled for the above procedure. History includes never smoker, HTN, pre-diabetes, GERD, glaucoma. BMI is consistent with obesity.   Patient recently relocated to the [place], [place] from [place], [place]. She reported prior cardiac testing (at [facility]) as part of a work-up for knee replacement surgery with Dr. Alfonse Spruce In [place] (s/p right TKA '15). She denied CAD. Update: I received records from [place]. She was seen by cardiologist Thomas Hoff, MD on 04/16/14. She had a cardiac PET scan that was low risk 04/2014.  Based on currently available information I would anticipate that she can proceed as planned if no acute changes. She denied chest pain and SOB.     VS: BP (!) 150/69   Pulse 77   Temp 36.4 C (Oral)   Resp 18   Ht 5\' 5"  (1.651 m)   Wt 88.4 kg   SpO2 100%   BMI 32.43 kg/m   PROVIDERS: Billie Ruddy, MD is PCP [facility]), recently established on 05/02/18. Her PCP in [place] was Edwinna Areola, MD ([place], [phone]).     LABS: Labs reviewed: Acceptable for surgery. (all labs ordered are listed, but only abnormal results are displayed)  Labs Reviewed  BASIC METABOLIC PANEL - Abnormal; Notable for the following components:      Result Value   Glucose, Bld 109 (*)    All other components within normal limits  HEMOGLOBIN A1C - Abnormal; Notable for the following components:   Hgb A1c MFr Bld 5.7 (*)    All other components within normal limits  SURGICAL PCR SCREEN  GLUCOSE, CAPILLARY  CBC    IMAGES: N/A   EKG: 05/30/18: NSR   CV:  Cardiac PET report (signed off  on by Dr. Elissa Lovett 05/05/14, [facility]) Conclusions: Cardiac PET scan is performed and patient is noted to have no perfusion abnormality.Gated images showed normal left ventricular function. Patient had normal hemodynamic response to Lexiscan. EKG is negative for ischemia.  Recommendations: Based on these findings patient is at low risk of significant obstructive coronary artery disease.   Past Medical History:  Diagnosis Date  . Arthritis   . Depression   . Diabetes mellitus without complication ([place])    pre-diabetic  . Family history of polyps in the colon   . GERD (gastroesophageal reflux disease)   . Glaucoma   . Hypertension     Past Surgical History:  Procedure Laterality Date  . BUNIONECTOMY    . CATARACT EXTRACTION     right  . REPLACEMENT TOTAL KNEE Right 2015  . ROTATOR CUFF REPAIR Right 2015  . TUBAL LIGATION      MEDICATIONS: . aspirin EC 81 MG tablet  . Cholecalciferol (VITAMIN D) 2000 units CAPS  . meloxicam (MOBIC) 15 MG tablet  . Multiple Vitamins-Minerals (CENTRUM SILVER PO)  . omeprazole (PRILOSEC) 40 MG capsule  . Probiotic Product (TRUNATURE DIGESTIVE PROBIOTIC) CAPS  . telmisartan-hydrochlorothiazide (MICARDIS HCT) 80-12.5 MG tablet   No current facility-administered medications for this encounter.   Patient to follow-up with Dr. Onnie Graham regarding perioperative ASA instructions.  Myra Gianotti, PA-C  [facility] Stay [facility]/Anesthesiology Phone [phone] 06/02/2018 4:25 PM

## 2018-06-03 ENCOUNTER — Other Ambulatory Visit (HOSPITAL_COMMUNITY): Payer: Self-pay

## 2018-06-11 ENCOUNTER — Encounter (HOSPITAL_COMMUNITY): Payer: Self-pay | Admitting: Anesthesiology

## 2018-06-11 MED ORDER — TRANEXAMIC ACID 1000 MG/10ML IV SOLN
1000.0000 mg | INTRAVENOUS | Status: AC
  Administered 2018-06-12: 1000 mg via INTRAVENOUS
  Filled 2018-06-11: qty 1000

## 2018-06-11 NOTE — Anesthesia Preprocedure Evaluation (Addendum)
Anesthesia Evaluation  Patient identified by MRN, date of birth, ID band Patient awake    Reviewed: Allergy & Precautions, NPO status , Patient's Chart, lab work & pertinent test results  Airway Mallampati: I       Dental no notable dental hx. (+) Teeth Intact   Pulmonary neg pulmonary ROS,    Pulmonary exam normal breath sounds clear to auscultation       Cardiovascular hypertension, Pt. on medications Normal cardiovascular exam Rhythm:Regular Rate:Normal     Neuro/Psych negative neurological ROS     GI/Hepatic GERD  Medicated,  Endo/Other  diabetes  Renal/GU   negative genitourinary   Musculoskeletal  (+) Arthritis , Osteoarthritis,    Abdominal (+) + obese,   Peds  Hematology   Anesthesia Other Findings   Reproductive/Obstetrics                            Anesthesia Physical Anesthesia Plan  ASA: II  Anesthesia Plan: General   Post-op Pain Management:  Regional for Post-op pain   Induction: Intravenous  PONV Risk Score and Plan: 3 and Ondansetron and Dexamethasone  Airway Management Planned: Oral ETT  Additional Equipment:   Intra-op Plan:   Post-operative Plan: Extubation in OR  Informed Consent: I have reviewed the patients History and Physical, chart, labs and discussed the procedure including the risks, benefits and alternatives for the proposed anesthesia with the patient or authorized representative who has indicated his/her understanding and acceptance.   Dental advisory given  Plan Discussed with: CRNA and Surgeon  Anesthesia Plan Comments:        Anesthesia Quick Evaluation

## 2018-06-12 ENCOUNTER — Inpatient Hospital Stay (HOSPITAL_COMMUNITY)
Admission: RE | Admit: 2018-06-12 | Discharge: 2018-06-13 | DRG: 483 | Disposition: A | Discharge status: Home / Self Care | Payer: Medicare Other | Source: Ambulatory Visit | Attending: Orthopedic Surgery | Admitting: Orthopedic Surgery

## 2018-06-12 ENCOUNTER — Encounter (HOSPITAL_COMMUNITY)
Admission: RE | Disposition: A | Discharge status: Home / Self Care | Payer: Self-pay | Source: Ambulatory Visit | Attending: Orthopedic Surgery

## 2018-06-12 ENCOUNTER — Other Ambulatory Visit: Payer: Self-pay

## 2018-06-12 ENCOUNTER — Inpatient Hospital Stay (HOSPITAL_COMMUNITY): Payer: Medicare Other | Admitting: Vascular Surgery

## 2018-06-12 ENCOUNTER — Encounter (HOSPITAL_COMMUNITY): Payer: Self-pay

## 2018-06-12 DIAGNOSIS — E119 Type 2 diabetes mellitus without complications: Secondary | ICD-10-CM | POA: Diagnosis present

## 2018-06-12 DIAGNOSIS — Z9841 Cataract extraction status, right eye: Secondary | ICD-10-CM

## 2018-06-12 DIAGNOSIS — M1990 Unspecified osteoarthritis, unspecified site: Secondary | ICD-10-CM | POA: Diagnosis not present

## 2018-06-12 DIAGNOSIS — Z791 Long term (current) use of non-steroidal anti-inflammatories (NSAID): Secondary | ICD-10-CM

## 2018-06-12 DIAGNOSIS — Z23 Encounter for immunization: Secondary | ICD-10-CM

## 2018-06-12 DIAGNOSIS — Z96612 Presence of left artificial shoulder joint: Secondary | ICD-10-CM

## 2018-06-12 DIAGNOSIS — Z96651 Presence of right artificial knee joint: Secondary | ICD-10-CM | POA: Diagnosis present

## 2018-06-12 DIAGNOSIS — Z880 Allergy status to penicillin: Secondary | ICD-10-CM

## 2018-06-12 DIAGNOSIS — K219 Gastro-esophageal reflux disease without esophagitis: Secondary | ICD-10-CM | POA: Diagnosis not present

## 2018-06-12 DIAGNOSIS — M75102 Unspecified rotator cuff tear or rupture of left shoulder, not specified as traumatic: Secondary | ICD-10-CM | POA: Diagnosis not present

## 2018-06-12 DIAGNOSIS — E669 Obesity, unspecified: Secondary | ICD-10-CM | POA: Diagnosis present

## 2018-06-12 DIAGNOSIS — H409 Unspecified glaucoma: Secondary | ICD-10-CM | POA: Diagnosis not present

## 2018-06-12 DIAGNOSIS — G8918 Other acute postprocedural pain: Secondary | ICD-10-CM | POA: Diagnosis not present

## 2018-06-12 DIAGNOSIS — Z7982 Long term (current) use of aspirin: Secondary | ICD-10-CM

## 2018-06-12 DIAGNOSIS — Z86010 Personal history of colonic polyps: Secondary | ICD-10-CM

## 2018-06-12 DIAGNOSIS — I10 Essential (primary) hypertension: Secondary | ICD-10-CM | POA: Diagnosis present

## 2018-06-12 DIAGNOSIS — Z6831 Body mass index (BMI) 31.0-31.9, adult: Secondary | ICD-10-CM | POA: Diagnosis not present

## 2018-06-12 DIAGNOSIS — Z96619 Presence of unspecified artificial shoulder joint: Secondary | ICD-10-CM

## 2018-06-12 DIAGNOSIS — M12812 Other specific arthropathies, not elsewhere classified, left shoulder: Secondary | ICD-10-CM | POA: Diagnosis not present

## 2018-06-12 HISTORY — DX: Type 2 diabetes mellitus without complications: E11.9

## 2018-06-12 HISTORY — PX: REVERSE SHOULDER ARTHROPLASTY: SHX5054

## 2018-06-12 LAB — GLUCOSE, CAPILLARY
GLUCOSE-CAPILLARY: 115 mg/dL — AB (ref 70–99)
Glucose-Capillary: 91 mg/dL (ref 70–99)
Glucose-Capillary: 92 mg/dL (ref 70–99)
Glucose-Capillary: 168 mg/dL — ABNORMAL HIGH (ref 70–99)

## 2018-06-12 SURGERY — ARTHROPLASTY, SHOULDER, TOTAL, REVERSE
Anesthesia: General | Site: Shoulder | Laterality: Left

## 2018-06-12 MED ORDER — OXYCODONE HCL 5 MG PO TABS: 5.0000 mg | ORAL_TABLET | ORAL | Status: DC | PRN

## 2018-06-12 MED ORDER — MIDAZOLAM HCL 5 MG/5ML IJ SOLN
INTRAMUSCULAR | Status: DC | PRN
  Administered 2018-06-12 (×2): 1 mg via INTRAVENOUS

## 2018-06-12 MED ORDER — PANTOPRAZOLE SODIUM 40 MG PO TBEC
40.0000 mg | DELAYED_RELEASE_TABLET | Freq: Every day | ORAL | Status: DC
  Administered 2018-06-12 – 2018-06-13 (×2): 40 mg via ORAL
  Filled 2018-06-12 (×2): qty 1

## 2018-06-12 MED ORDER — MENTHOL 3 MG MT LOZG: 1.0000 | LOZENGE | OROMUCOSAL | Status: DC | PRN

## 2018-06-12 MED ORDER — POLYETHYLENE GLYCOL 3350 17 G PO PACK: 17.0000 g | PACK | Freq: Every day | ORAL | Status: DC | PRN

## 2018-06-12 MED ORDER — ROCURONIUM BROMIDE 10 MG/ML (PF) SYRINGE
PREFILLED_SYRINGE | INTRAVENOUS | Status: DC | PRN
  Administered 2018-06-12 (×2): 5 mg via INTRAVENOUS
  Administered 2018-06-12: 40 mg via INTRAVENOUS
  Administered 2018-06-12: 10 mg via INTRAVENOUS

## 2018-06-12 MED ORDER — FENTANYL CITRATE (PF) 250 MCG/5ML IJ SOLN
INTRAMUSCULAR | Status: DC | PRN
  Administered 2018-06-12: 50 ug via INTRAVENOUS
  Administered 2018-06-12: 25 ug via INTRAVENOUS
  Administered 2018-06-12: 50 ug via INTRAVENOUS
  Administered 2018-06-12: 25 ug via INTRAVENOUS
  Administered 2018-06-12 (×2): 50 ug via INTRAVENOUS

## 2018-06-12 MED ORDER — ACETAMINOPHEN 325 MG PO TABS: 325.0000 mg | ORAL_TABLET | Freq: Four times a day (QID) | ORAL | Status: DC | PRN

## 2018-06-12 MED ORDER — LACTATED RINGERS IV SOLN
INTRAVENOUS | Status: DC | PRN
  Administered 2018-06-12: 07:00:00 via INTRAVENOUS

## 2018-06-12 MED ORDER — METHOCARBAMOL 500 MG PO TABS
500.0000 mg | ORAL_TABLET | Freq: Four times a day (QID) | ORAL | Status: DC | PRN
  Administered 2018-06-12: 500 mg via ORAL
  Filled 2018-06-12: qty 1

## 2018-06-12 MED ORDER — LACTATED RINGERS IV SOLN
INTRAVENOUS | Status: DC
  Administered 2018-06-12: 13:00:00 via INTRAVENOUS

## 2018-06-12 MED ORDER — TEMAZEPAM 15 MG PO CAPS: 15.0000 mg | ORAL_CAPSULE | Freq: Every evening | ORAL | Status: DC | PRN

## 2018-06-12 MED ORDER — TELMISARTAN-HCTZ 80-12.5 MG PO TABS: 1.0000 | ORAL_TABLET | Freq: Every day | ORAL | Status: DC

## 2018-06-12 MED ORDER — SODIUM CHLORIDE 0.9 % IV SOLN
INTRAVENOUS | Status: DC | PRN
  Administered 2018-06-12: 20 ug/min via INTRAVENOUS

## 2018-06-12 MED ORDER — LIDOCAINE 2% (20 MG/ML) 5 ML SYRINGE
INTRAMUSCULAR | Status: DC | PRN
  Administered 2018-06-12: 100 mg via INTRAVENOUS

## 2018-06-12 MED ORDER — PROMETHAZINE HCL 25 MG/ML IJ SOLN: 6.2500 mg | INTRAMUSCULAR | Status: DC | PRN

## 2018-06-12 MED ORDER — ONDANSETRON HCL 4 MG/2ML IJ SOLN: 4.0000 mg | Freq: Four times a day (QID) | INTRAMUSCULAR | Status: DC | PRN

## 2018-06-12 MED ORDER — PROPOFOL 10 MG/ML IV BOLUS
INTRAVENOUS | Status: DC | PRN
  Administered 2018-06-12: 130 mg via INTRAVENOUS

## 2018-06-12 MED ORDER — PHENOL 1.4 % MT LIQD: 1.0000 | OROMUCOSAL | Status: DC | PRN

## 2018-06-12 MED ORDER — SUGAMMADEX SODIUM 200 MG/2ML IV SOLN
INTRAVENOUS | Status: DC | PRN
  Administered 2018-06-12: 200 mg via INTRAVENOUS

## 2018-06-12 MED ORDER — MAGNESIUM CITRATE PO SOLN: 1.0000 | Freq: Once | ORAL | Status: DC | PRN

## 2018-06-12 MED ORDER — PNEUMOCOCCAL VAC POLYVALENT 25 MCG/0.5ML IJ INJ
0.5000 mL | INJECTION | INTRAMUSCULAR | Status: AC
  Administered 2018-06-13: 0.5 mL via INTRAMUSCULAR
  Filled 2018-06-12: qty 0.5

## 2018-06-12 MED ORDER — DEXAMETHASONE SODIUM PHOSPHATE 10 MG/ML IJ SOLN
INTRAMUSCULAR | Status: DC | PRN
  Administered 2018-06-12: 8 mg via INTRAVENOUS

## 2018-06-12 MED ORDER — FENTANYL CITRATE (PF) 250 MCG/5ML IJ SOLN
INTRAMUSCULAR | Status: AC
  Filled 2018-06-12: qty 5

## 2018-06-12 MED ORDER — KETOROLAC TROMETHAMINE 15 MG/ML IJ SOLN
INTRAMUSCULAR | Status: AC
  Filled 2018-06-12: qty 1

## 2018-06-12 MED ORDER — CEFAZOLIN SODIUM-DEXTROSE 2-4 GM/100ML-% IV SOLN
2.0000 g | INTRAVENOUS | Status: AC
  Administered 2018-06-12: 2 g via INTRAVENOUS

## 2018-06-12 MED ORDER — HYDROMORPHONE HCL 1 MG/ML IJ SOLN
0.2500 mg | INTRAMUSCULAR | Status: DC | PRN
  Administered 2018-06-12: 0.5 mg via INTRAVENOUS

## 2018-06-12 MED ORDER — BUPIVACAINE LIPOSOME 1.3 % IJ SUSP
INTRAMUSCULAR | Status: DC | PRN
  Administered 2018-06-12: 3 mL via PERINEURAL
  Administered 2018-06-12: 4 mL via PERINEURAL
  Administered 2018-06-12: 3 mL via PERINEURAL

## 2018-06-12 MED ORDER — HYDROMORPHONE HCL 1 MG/ML IJ SOLN
0.5000 mg | INTRAMUSCULAR | Status: DC | PRN
  Administered 2018-06-12 – 2018-06-13 (×2): 1 mg via INTRAVENOUS
  Filled 2018-06-12 (×2): qty 1

## 2018-06-12 MED ORDER — METOCLOPRAMIDE HCL 5 MG PO TABS: 5.0000 mg | ORAL_TABLET | Freq: Three times a day (TID) | ORAL | Status: DC | PRN

## 2018-06-12 MED ORDER — EPHEDRINE SULFATE-NACL 50-0.9 MG/10ML-% IV SOSY
PREFILLED_SYRINGE | INTRAVENOUS | Status: DC | PRN
  Administered 2018-06-12: 10 mg via INTRAVENOUS

## 2018-06-12 MED ORDER — METHOCARBAMOL 1000 MG/10ML IJ SOLN
500.0000 mg | Freq: Four times a day (QID) | INTRAVENOUS | Status: DC | PRN
  Filled 2018-06-12: qty 5

## 2018-06-12 MED ORDER — ONDANSETRON HCL 4 MG PO TABS: 4.0000 mg | ORAL_TABLET | Freq: Four times a day (QID) | ORAL | Status: DC | PRN

## 2018-06-12 MED ORDER — KETOROLAC TROMETHAMINE 15 MG/ML IJ SOLN
7.5000 mg | Freq: Three times a day (TID) | INTRAMUSCULAR | Status: DC
  Administered 2018-06-12 – 2018-06-13 (×3): 7.5 mg via INTRAVENOUS
  Filled 2018-06-12 (×3): qty 1

## 2018-06-12 MED ORDER — HYDROCHLOROTHIAZIDE 12.5 MG PO CAPS
12.5000 mg | ORAL_CAPSULE | Freq: Every day | ORAL | Status: DC
  Administered 2018-06-12 – 2018-06-13 (×2): 12.5 mg via ORAL
  Filled 2018-06-12 (×2): qty 1

## 2018-06-12 MED ORDER — PROPOFOL 10 MG/ML IV BOLUS
INTRAVENOUS | Status: AC
  Filled 2018-06-12: qty 20

## 2018-06-12 MED ORDER — KETOROLAC TROMETHAMINE 15 MG/ML IJ SOLN
15.0000 mg | Freq: Once | INTRAMUSCULAR | Status: AC
  Administered 2018-06-12: 15 mg via INTRAVENOUS

## 2018-06-12 MED ORDER — IRBESARTAN 300 MG PO TABS
300.0000 mg | ORAL_TABLET | Freq: Every day | ORAL | Status: DC
  Administered 2018-06-12 – 2018-06-13 (×2): 300 mg via ORAL
  Filled 2018-06-12 (×2): qty 1

## 2018-06-12 MED ORDER — MEPERIDINE HCL 50 MG/ML IJ SOLN: 6.2500 mg | INTRAMUSCULAR | Status: DC | PRN

## 2018-06-12 MED ORDER — BUPIVACAINE-EPINEPHRINE (PF) 0.5% -1:200000 IJ SOLN
INTRAMUSCULAR | Status: DC | PRN
  Administered 2018-06-12 (×3): 5 mL via PERINEURAL

## 2018-06-12 MED ORDER — 0.9 % SODIUM CHLORIDE (POUR BTL) OPTIME
TOPICAL | Status: DC | PRN
  Administered 2018-06-12: 1000 mL

## 2018-06-12 MED ORDER — METOCLOPRAMIDE HCL 5 MG/ML IJ SOLN: 5.0000 mg | Freq: Three times a day (TID) | INTRAMUSCULAR | Status: DC | PRN

## 2018-06-12 MED ORDER — CHLORHEXIDINE GLUCONATE 4 % EX LIQD: 60.0000 mL | Freq: Once | CUTANEOUS | Status: DC

## 2018-06-12 MED ORDER — BISACODYL 5 MG PO TBEC: 5.0000 mg | DELAYED_RELEASE_TABLET | Freq: Every day | ORAL | Status: DC | PRN

## 2018-06-12 MED ORDER — DOCUSATE SODIUM 100 MG PO CAPS
100.0000 mg | ORAL_CAPSULE | Freq: Two times a day (BID) | ORAL | Status: DC
  Administered 2018-06-12 – 2018-06-13 (×3): 100 mg via ORAL
  Filled 2018-06-12 (×3): qty 1

## 2018-06-12 MED ORDER — ONDANSETRON HCL 4 MG/2ML IJ SOLN
INTRAMUSCULAR | Status: DC | PRN
  Administered 2018-06-12: 4 mg via INTRAVENOUS

## 2018-06-12 MED ORDER — ASPIRIN EC 81 MG PO TBEC
81.0000 mg | DELAYED_RELEASE_TABLET | Freq: Every day | ORAL | Status: DC
  Administered 2018-06-12 – 2018-06-13 (×2): 81 mg via ORAL
  Filled 2018-06-12 (×2): qty 1

## 2018-06-12 MED ORDER — DIPHENHYDRAMINE HCL 12.5 MG/5ML PO ELIX: 12.5000 mg | ORAL_SOLUTION | ORAL | Status: DC | PRN

## 2018-06-12 MED ORDER — ALUM & MAG HYDROXIDE-SIMETH 200-200-20 MG/5ML PO SUSP: 30.0000 mL | ORAL | Status: DC | PRN

## 2018-06-12 MED ORDER — HYDROMORPHONE HCL 1 MG/ML IJ SOLN
INTRAMUSCULAR | Status: AC
  Filled 2018-06-12: qty 1

## 2018-06-12 MED ORDER — MIDAZOLAM HCL 2 MG/2ML IJ SOLN
INTRAMUSCULAR | Status: AC
  Filled 2018-06-12: qty 2

## 2018-06-12 SURGICAL SUPPLY — 56 items
BASEPLATE GLENOID SHLDR SM (Shoulder) ×2 IMPLANT
BLADE SAW SGTL 83.5X18.5 (BLADE) ×2 IMPLANT
COVER SURGICAL LIGHT HANDLE (MISCELLANEOUS) ×2 IMPLANT
COVER WAND RF STERILE (DRAPES) ×2 IMPLANT
DERMABOND ADVANCED (GAUZE/BANDAGES/DRESSINGS) ×1
DERMABOND ADVANCED .7 DNX12 (GAUZE/BANDAGES/DRESSINGS) ×1 IMPLANT
DRAPE ORTHO SPLIT 77X108 STRL (DRAPES) ×2
DRAPE SURG 17X11 SM STRL (DRAPES) ×2 IMPLANT
DRAPE SURG ORHT 6 SPLT 77X108 (DRAPES) ×2 IMPLANT
DRAPE U-SHAPE 47X51 STRL (DRAPES) ×2 IMPLANT
DRSG AQUACEL AG ADV 3.5X10 (GAUZE/BANDAGES/DRESSINGS) ×2 IMPLANT
DURAPREP 26ML APPLICATOR (WOUND CARE) ×2 IMPLANT
ELECT BLADE 4.0 EZ CLEAN MEGAD (MISCELLANEOUS) ×2
ELECT CAUTERY BLADE 6.4 (BLADE) ×2 IMPLANT
ELECT REM PT RETURN 9FT ADLT (ELECTROSURGICAL) ×2
ELECTRODE BLDE 4.0 EZ CLN MEGD (MISCELLANEOUS) ×1 IMPLANT
ELECTRODE REM PT RTRN 9FT ADLT (ELECTROSURGICAL) ×1 IMPLANT
FACESHIELD WRAPAROUND (MASK) ×6 IMPLANT
GLENOSPHERE LATERAL 36MM+4 (Shoulder) ×2 IMPLANT
GLOVE BIO SURGEON STRL SZ7.5 (GLOVE) ×2 IMPLANT
GLOVE BIO SURGEON STRL SZ8 (GLOVE) ×2 IMPLANT
GLOVE BIOGEL PI IND STRL 7.5 (GLOVE) ×1 IMPLANT
GLOVE BIOGEL PI INDICATOR 7.5 (GLOVE) ×1
GLOVE EUDERMIC 7 POWDERFREE (GLOVE) ×2 IMPLANT
GLOVE SS BIOGEL STRL SZ 7.5 (GLOVE) ×2 IMPLANT
GLOVE SUPERSENSE BIOGEL SZ 7.5 (GLOVE) ×2
GLOVE SURG SS PI 6.5 STRL IVOR (GLOVE) ×2 IMPLANT
GOWN STRL REUS W/ TWL LRG LVL3 (GOWN DISPOSABLE) ×2 IMPLANT
GOWN STRL REUS W/ TWL XL LVL3 (GOWN DISPOSABLE) ×2 IMPLANT
GOWN STRL REUS W/TWL LRG LVL3 (GOWN DISPOSABLE) ×2
GOWN STRL REUS W/TWL XL LVL3 (GOWN DISPOSABLE) ×2
KIT BASIN OR (CUSTOM PROCEDURE TRAY) ×2 IMPLANT
KIT TURNOVER KIT B (KITS) ×2 IMPLANT
LINER HUMERAL 36 +3MM SM (Shoulder) ×2 IMPLANT
MANIFOLD NEPTUNE II (INSTRUMENTS) ×2 IMPLANT
NEEDLE TAPERED W/ NITINOL LOOP (MISCELLANEOUS) ×2 IMPLANT
NS IRRIG 1000ML POUR BTL (IV SOLUTION) ×2 IMPLANT
PACK SHOULDER (CUSTOM PROCEDURE TRAY) ×2 IMPLANT
PAD ARMBOARD 7.5X6 YLW CONV (MISCELLANEOUS) ×4 IMPLANT
RESTRAINT HEAD UNIVERSAL NS (MISCELLANEOUS) ×2 IMPLANT
SCREW CENTRAL NONLOCK 6.5X20MM (Shoulder) ×2 IMPLANT
SCREW LOCK PERIPHERAL 30MM (Shoulder) ×2 IMPLANT
SCREW LOCK PERIPHERAL 36MM (Screw) ×2 IMPLANT
SET PIN UNIVERSAL REVERSE (SET/KITS/TRAYS/PACK) ×2 IMPLANT
SLING ARM FOAM STRAP LRG (SOFTGOODS) ×2 IMPLANT
SLING ARM IMMOBILIZER MED (SOFTGOODS) ×2 IMPLANT
SPONGE LAP 18X18 X RAY DECT (DISPOSABLE) ×2 IMPLANT
SPONGE LAP 4X18 RFD (DISPOSABLE) ×2 IMPLANT
STEM REVERSE SIZE 5-135 (Shoulder) ×2 IMPLANT
SUT MNCRL AB 3-0 PS2 18 (SUTURE) ×2 IMPLANT
SUT MON AB 2-0 CT1 27 (SUTURE) ×2 IMPLANT
SUT VIC AB 1 CT1 27 (SUTURE) ×1
SUT VIC AB 1 CT1 27XBRD ANBCTR (SUTURE) ×1 IMPLANT
SUTURE TAPE 1.3 40 TPR END (SUTURE) ×1 IMPLANT
SUTURETAPE 1.3 40 TPR END (SUTURE) ×2
TOWEL OR 17X26 10 PK STRL BLUE (TOWEL DISPOSABLE) ×2 IMPLANT

## 2018-06-12 NOTE — Op Note (Signed)
06/12/2018  9:35 AM  PATIENT:   Suzanne Conner  72 y.o. female  PRE-OPERATIVE DIAGNOSIS:  Left shoulder rotator cuff tear arthropathy  POST-OPERATIVE DIAGNOSIS: Same  PROCEDURE: Left shoulder reverse arthroplasty utilizing a press-fit size 5 Arthrex monoblock stem with a +3 polyethylene insert on a 36+4 glenosphere over a small baseplate  SURGEON:  Don Giarrusso, Metta Clines M.D.  ASSISTANTS: Jenetta Loges, PA-C  ANESTHESIA:   General endotracheal as well as an Exparel intrascalene block  EBL: 150 cc  SPECIMEN: None  Drains: None   PATIENT DISPOSITION:  PACU - hemodynamically stable.    PLAN OF CARE: Admit for overnight observation   Brief history:  Suzanne Conner has been followed for chronic and progressively increasing left shoulder pain related to end-stage rotator cuff tear arthropathy.  Plain radiographs confirm a high riding humeral head with erosion of the undersurface of the acromion and clinical exam demonstrates a severely restricted and painful arc of motion with global weakness.  Due to Suzanne Conner increasing pain and functional mentation she is brought to the operating this time for planned left reverse shoulder arthroplasty  Preoperatively I did counseled Suzanne Conner regarding treatment options as well as the potential risks versus benefits thereof.  Possible surgical complications were reviewed including potential bleeding, infection, neurovascular injury, persistent pain, anesthetic complication, failure the implant, and possible need for additional surgery.  She understands and accepts and agrees with Suzanne Conner planned procedure.  Procedure in detail:  After undergoing routine preop evaluation patient received prophylactic antibiotics and interscalene block was established in the holding area by the anesthesia department using Exparel.  Brought to the operating placed supine on the operative table underwent smooth induction of a general endotracheal anesthesia.  Placed in the beachchair  position and appropriate padding protected.  Left shoulder girdle region was sterilely prepped and draped in standard fashion.  Timeout was called.  An anterior deltopectoral approach left shoulder was made through a 10 cm incision.  Skin flaps were elevated dissection carried deeply the interval was then developed a proximal distal with a cephalic vein taken laterally.  Upper centimeter the pectoralis major was tenotomized to improve exposure the conjoined tendon was then mobilized and retracted medially.  I then tenodesed the long head biceps tendon at the upper border of the pectoralis major and the proximal segment was then unroofed and excised and then performed a subscapularis peel and intact margin of the subscapularis and then divided the capsular attachments from the anterior inferior and inferior aspects of the humeral neck.  Humeral head was then delivered through the wound we performed Suzanne Conner humeral head resection using Suzanne Conner extra medullary guide and then placed a metal cap over the cut proximal humeral surface.  This point we exposed the glenoid with combination of Fukuda, pitchfork, and stick tongue retractors.  I performed a circumferential labral resection and placed a guidepin into the center of the glenoid reamed the glenoid with both the central and peripheral reamers and then impacted our small baseplate followed by a central lag screw followed by the inferior and superior locking screws respectively all of which obtained excellent purchase and fixation.  We this point attempted to place Suzanne Conner glenosphere but there was significant tightness of the soft tissue envelope so we elected to proceed with completion of our humeral preparation.  We reamed the humeral canal and this actually was very tight and we can only seated a size 5 reamer.  I then broached with a size 5 broach and it was clear  that the canal dimensions would not accept anything larger than the size 5.  We did ream with a size 6 reamer to  try to gain some additional room to see the implant deeper.  We then performed preparation of the metaphysis using the appropriate metaphyseal reamer.  Once we gained additional room for exposure of the glenoid we then returned to attention back to the glenoid baseplate and applied the 36+4 humeral head and this was impacted.  At this point we then returned to attention back to the humerus formed additional broaching and ultimately our final size 5 stem with a neutral metaphysis was impacted we achieved a trial reduction with a +3 poly-and this was to our satisfaction.  The trial poly-was removed we impacted the final +3 polyethylene insert and final reduction was then performed and this showed good soft tissue balance and good shoulder motion and stability.  We did not identify any need for repair of the subscapularis and so this was removed from the tags.  The deltopectoral intervals and reapproximated although we did note that the cephalic vein was compromised and so this was electrocoagulated at its midpoint.  The deltopectoral interval was then reapproximated with a series of figure-of-eight #1 Vicryl sutures.  2-0 Vicryl used for the subcu layer intracuticular through Monocryl the skin followed by Dermabond and Aquasol dressing left arm was then placed into a sling and patient was awakened, extubated, and taken recovery room in stable condition  [facility], PA-C was used as an Environmental consultant throughout this case essential for help with positioning the patient, position extremity, tissue ablation, implantation prosthesis, wound closure, and intraoperative decision-making.  Marin Shutter MD   Contact # [phone]

## 2018-06-12 NOTE — Discharge Instructions (Signed)
° °Kevin M. Supple, M.D., F.A.A.O.S. °Orthopaedic Surgery °Specializing in Arthroscopic and Reconstructive °Surgery of the Shoulder and Knee °[phone] °[address] - [place], [place] [address] - Fax [phone] ° ° °POST-OP TOTAL SHOULDER REPLACEMENT INSTRUCTIONS ° °1. Call the office at [phone] to schedule your first post-op appointment 10-14 days from the date of your surgery. ° °2. The bandage over your incision is waterproof. You may begin showering with this dressing on. You may leave this dressing on until first follow up appointment within 2 weeks. We prefer you leave this dressing in place until follow up however after 5-7 days if you are having itching or skin irritation and would like to remove it you may do so. Go slow and tug at the borders gently to break the bond the dressing has with the skin. At this point if there is no drainage it is okay to go without a bandage or you may cover it with a light guaze and tape. You can also expect significant bruising around your shoulder that will drift down your arm and into your chest wall. This is very normal and should resolve over several days. ° ° 3. Wear your sling/immobilizer at all times except to perform the exercises below or to occasionally let your arm dangle by your side to stretch your elbow. You also need to sleep in your sling immobilizer until instructed otherwise. ° °4. Range of motion to your elbow, wrist, and hand are encouraged 3-5 times daily. Exercise to your hand and fingers helps to reduce swelling you may experience. ° °5. Utilize ice to the shoulder 3-5 times minimum a day and additionally if you are experiencing pain. ° °6. Prescriptions for a pain medication and a muscle relaxant are provided for you. It is recommended that if you are experiencing pain that you pain medication alone is not controlling, add the muscle relaxant along with the pain medication which can give additional pain relief. The first 1-2 days  is generally the most severe of your pain and then should gradually decrease. As your pain lessens it is recommended that you decrease your use of the pain medications to an "as needed basis'" only and to always comply with the recommended dosages of the pain medications. ° °7. Pain medications can produce constipation along with their use. If you experience this, the use of an over the counter stool softener or laxative daily is recommended.  ° °8. For additional questions or concerns, please do not hesitate to call the office. If after hours there is an answering service to forward your concerns to the physician on call. ° °9.Pain control following an exparel block ° °To help control your post-operative pain you received a nerve block  performed with Exparel which is a long acting anesthetic (numbing agent) which can provide pain relief and sensations of numbness (and relief of pain) in the operative shoulder and arm for up to 3 days. Sometimes it provides mixed relief, meaning you may still have numbness in certain areas of the arm but can still be able to move  parts of that arm, hand, and fingers. We recommend that your prescribed pain medications  be used as needed. We do not feel it is necessary to "pre medicate" and "stay ahead" of pain.  Taking narcotic pain medications when you are not having any pain can lead to unnecessary and potentially dangerous side effects.  ° °POST-OP EXERCISES ° °Pendulum Exercises ° °Perform pendulum exercises while standing and bending at   the waist. Support your uninvolved arm on a table or chair and allow your operated arm to hang freely. Make sure to do these exercises passively - not using you shoulder muscles. ° °Repeat 20 times. Do 3 sessions per day. ° ° ° ° °

## 2018-06-12 NOTE — Anesthesia Procedure Notes (Signed)
Procedure Name: Intubation Date/Time: 06/12/2018 7:46 AM Performed by: Marsa Aris, CRNA Pre-anesthesia Checklist: Patient identified, Emergency Drugs available, Suction available and Patient being monitored Patient Re-evaluated:Patient Re-evaluated prior to induction Oxygen Delivery Method: Circle System Utilized Preoxygenation: Pre-oxygenation with 100% oxygen Induction Type: IV induction Ventilation: Mask ventilation without difficulty Laryngoscope Size: Miller and 2 Grade View: Grade I Tube type: Oral Number of attempts: 1 Airway Equipment and Method: Stylet and Oral airway Placement Confirmation: ETT inserted through vocal cords under direct vision,  positive ETCO2 and breath sounds checked- equal and bilateral Secured at: 21 cm Tube secured with: Tape Dental Injury: Teeth and Oropharynx as per pre-operative assessment  Comments: No change in dentition from pre-procedure

## 2018-06-12 NOTE — Anesthesia Procedure Notes (Addendum)
Anesthesia Regional Block: Interscalene brachial plexus block   Pre-Anesthetic Checklist: ,, timeout performed, Correct Patient, Correct Site, Correct Laterality, Correct Procedure, Correct Position, site marked, Risks and benefits discussed,  Surgical consent,  Pre-op evaluation,  At surgeon's request and post-op pain management  Laterality: Left and Upper  Prep: chloraprep       Needles:  Injection technique: Single-shot  Needle Type: Echogenic Stimulator Needle     Needle Length: 10cm  Needle Gauge: 21   Needle insertion depth: 2 cm   Additional Needles:   Procedures:,,,, ultrasound used (permanent image in chart),,,,  Narrative:  Start time: 06/12/2018 7:05 AM End time: 06/12/2018 7:20 AM Injection made incrementally with aspirations every 5 mL.  Performed by: Personally  Anesthesiologist: Lyn Hollingshead, MD

## 2018-06-12 NOTE — Anesthesia Postprocedure Evaluation (Signed)
Anesthesia Post Note  Patient: Suzanne Conner  Procedure(s) Performed: LEFT REVERSE SHOULDER ARTHROPLASTY (Left Shoulder)     Patient location during evaluation: PACU Anesthesia Type: General Level of consciousness: awake Pain management: pain level controlled Vital Signs Assessment: post-procedure vital signs reviewed and stable Respiratory status: spontaneous breathing Cardiovascular status: stable Postop Assessment: no apparent nausea or vomiting Anesthetic complications: no    Last Vitals:  Vitals:   06/12/18 1045 06/12/18 1100  BP: 115/63 (!) 117/57  Pulse: 81 90  Resp: 14 17  Temp: (!) 36.3 C   SpO2: 100% 100%    Last Pain:  Vitals:   06/12/18 1045  TempSrc:   PainSc: 4    Pain Goal: Patients Stated Pain Goal: 4 (06/12/18 1045)               [place]

## 2018-06-12 NOTE — Progress Notes (Signed)
Pt arrived to room 5N08 via bed after surgery. Received report from [place], [place] in PACU. See assessment. Will continue to monitor.

## 2018-06-12 NOTE — Plan of Care (Signed)
Problem: Education: Goal: Knowledge of the prescribed therapeutic regimen will improve Outcome: Progressing   Problem: Pain Management: Goal: Pain level will decrease with appropriate interventions Outcome: Progressing   Problem: Education: Goal: Knowledge of General Education information will improve Description Including pain rating scale, medication(s)/side effects and non-pharmacologic comfort measures Outcome: Progressing   Problem: Health Behavior/Discharge Planning: Goal: Ability to manage health-related needs will improve Outcome: Progressing   Problem: Nutrition: Goal: Adequate nutrition will be maintained Outcome: Progressing   Problem: Coping: Goal: Level of anxiety will decrease Outcome: Progressing   Problem: Pain Managment: Goal: General experience of comfort will improve Outcome: Progressing   Problem: Safety: Goal: Ability to remain free from injury will improve Outcome: Progressing   Problem: Skin Integrity: Goal: Risk for impaired skin integrity will decrease Outcome: Progressing

## 2018-06-12 NOTE — Transfer of Care (Signed)
Immediate Anesthesia Transfer of Care Note  Patient: Suzanne Conner  Procedure(s) Performed: LEFT REVERSE SHOULDER ARTHROPLASTY (Left Shoulder)  Patient Location: PACU  Anesthesia Type:GA combined with regional for post-op pain  Level of Consciousness: awake, alert  and oriented  Airway & Oxygen Therapy: Patient Spontanous Breathing  Post-op Assessment: Report given to RN and Post -op Vital signs reviewed and stable  Post vital signs: Reviewed and stable  Last Vitals:  Vitals Value Taken Time  BP 118/80 06/12/2018  9:45 AM  Temp    Pulse    Resp 19 06/12/2018  9:45 AM  SpO2    Vitals shown include unvalidated device data.  Last Pain:  Vitals:   06/12/18 0555  TempSrc:   PainSc: 0-No pain      Patients Stated Pain Goal: 2 (27/67/01 1003)  Complications: No apparent anesthesia complications

## 2018-06-12 NOTE — H&P (Signed)
Suzanne Conner    Chief Complaint: Left shoulder rotator cuff tear arthropathy HPI: The patient is a 72 y.o. female with end stage left shoulder rotator cuff tear arthropathy  Past Medical History:  Diagnosis Date  . Arthritis   . Depression   . Diabetes mellitus without complication ([place])    pre-diabetic  . Family history of polyps in the colon   . GERD (gastroesophageal reflux disease)   . Glaucoma   . Hypertension     Past Surgical History:  Procedure Laterality Date  . BUNIONECTOMY    . CATARACT EXTRACTION     right  . REPLACEMENT TOTAL KNEE Right 2015  . ROTATOR CUFF REPAIR Right 2015  . TUBAL LIGATION      History reviewed. No pertinent family history.  Social History:  reports that she has never smoked. She has never used smokeless tobacco. She reports that she drinks alcohol. She reports that she does not use drugs.   Medications Prior to Admission  Medication Sig Dispense Refill  . aspirin EC 81 MG tablet Take 81 mg by mouth daily.    . Cholecalciferol (VITAMIN D) 2000 units CAPS Take 2,000 Units by mouth daily.    . meloxicam (MOBIC) 15 MG tablet Take 15 mg by mouth daily as needed for pain.   1  . Multiple Vitamins-Minerals (CENTRUM SILVER PO) Take 1 tablet by mouth daily.     Marland Kitchen omeprazole (PRILOSEC) 40 MG capsule Take 1 capsule (40 mg total) by mouth 2 (two) times daily. 60 capsule 3  . Probiotic Product (TRUNATURE DIGESTIVE PROBIOTIC) CAPS Take 1 capsule by mouth daily.     Marland Kitchen telmisartan-hydrochlorothiazide (MICARDIS HCT) 80-12.5 MG tablet Take 1 tablet by mouth daily.       Physical Exam: left shoulder with painful and restricted motion as noted at recent office visits  Vitals  Temp:  [98.4 F (36.9 C)] 98.4 F (36.9 C) (10/03 0542) Pulse Rate:  [84] 84 (10/03 0542) Resp:  [18] 18 (10/03 0542) BP: (139)/(54) 139/54 (10/03 0542) SpO2:  [98 %] 98 % (10/03 0542) Weight:  [85.7 kg] 85.7 kg (10/03 0542)  Assessment/Plan  Impression: Left shoulder  rotator cuff tear arthropathy  Plan of Action: Procedure(s): LEFT REVERSE SHOULDER ARTHROPLASTY  Shefali Ng M Horton [place] 06/12/2018, 6:52 AM Contact # [phone]

## 2018-06-13 ENCOUNTER — Encounter (HOSPITAL_COMMUNITY): Payer: Self-pay | Admitting: Orthopedic Surgery

## 2018-06-13 MED ORDER — ONDANSETRON HCL 4 MG PO TABS: 4.0000 mg | ORAL_TABLET | Freq: Three times a day (TID) | ORAL | 0 refills | Status: DC | PRN

## 2018-06-13 MED ORDER — OXYCODONE-ACETAMINOPHEN 5-325 MG PO TABS: 1.0000 | ORAL_TABLET | ORAL | 0 refills | Status: DC | PRN

## 2018-06-13 MED ORDER — METHOCARBAMOL 500 MG PO TABS: 500.0000 mg | ORAL_TABLET | Freq: Three times a day (TID) | ORAL | 1 refills | Status: DC | PRN

## 2018-06-13 MED ORDER — NAPROXEN 500 MG PO TABS: 500.0000 mg | ORAL_TABLET | Freq: Two times a day (BID) | ORAL | 1 refills | Status: DC

## 2018-06-13 NOTE — Evaluation (Signed)
Occupational Therapy Evaluation and Discharge Patient Details Name: Suzanne Conner MRN: [id number] DOB: 1946-04-22 Today's Date: 06/13/2018    History of Present Illness The patient is a 72 y.o. female with end stage left shoulder rotator cuff tear . Now S/P reverse left total shoulder arthroplasty. Pt has a PMH including Arthritis, Depression, Glaucoma, Hypertension, and Type 2 diabetes.   Clinical Impression   PTA Pt independent in ADL and mobility. Pt is currently mod to max A for UB ADL. Pt educated on sling management, sleeping, showering/bathing, dressing, and exercises as ordered by MD. Pt's block still largely in place. Pt with no problem ambulating from bed to recliner and performing transfers without assist and with no attempts to WB through LUE. Education complete. Please continue rehab of shoulder as ordered by MD at follow up. Thank you for the opportunity to serve this patient.     Follow Up Recommendations  Follow surgeon's recommendation for DC plan and follow-up therapies    Equipment Recommendations  None recommended by OT    Recommendations for Other Services       Precautions / Restrictions Precautions Precautions: Shoulder Shoulder Interventions: Shoulder sling/immobilizer;Off for dressing/bathing/exercises Precaution Booklet Issued: Yes (comment) Precaution Comments: shoulder dc handout and exercise handout reviewed in full Required Braces or Orthoses: Sling Restrictions Weight Bearing Restrictions: Yes LUE Weight Bearing: Non weight bearing      Mobility Bed Mobility Overal bed mobility: Modified Independent             General bed mobility comments: no attempt to push or pull with LUE  Transfers Overall transfer level: Needs assistance Equipment used: None Transfers: Sit to/from Stand Sit to Stand: Supervision         General transfer comment: supervision for safety    Balance Overall balance assessment: Independent                                         ADL either performed or assessed with clinical judgement   ADL                                         General ADL Comments: please see shoulder section below     Vision Baseline Vision/History: Wears glasses Wears Glasses: Reading only Patient Visual Report: No change from baseline Vision Assessment?: No apparent visual deficits     Perception     Praxis      Pertinent Vitals/Pain Pain Assessment: 0-10 Pain Score: 1  Pain Location: left shoulder Pain Descriptors / Indicators: Dull;Discomfort Pain Intervention(s): Limited activity within patient's tolerance;Monitored during session;Repositioned(block still largely in place)     Hand Dominance Right   Extremity/Trunk Assessment Upper Extremity Assessment Upper Extremity Assessment: LUE deficits/detail LUE Deficits / Details: deficits as anticipated post-op; block still largely in place LUE Sensation: decreased light touch LUE Coordination: decreased fine motor;decreased gross motor   Lower Extremity Assessment Lower Extremity Assessment: Overall WFL for tasks assessed       Communication Communication Communication: No difficulties   Cognition Arousal/Alertness: Awake/alert Behavior During Therapy: WFL for tasks assessed/performed Overall Cognitive Status: Within Functional Limits for tasks assessed  General Comments       Exercises Exercises: Shoulder Shoulder Exercises Pendulum Exercise: PROM;Left;10 reps;Standing;Seated(gentle) Shoulder Flexion: PROM;AROM;AAROM;Left;Supine(educated to 60; to pain tolerance for functional ADL) Shoulder Extension: PROM;AROM;AAROM;Left Shoulder ABduction: PROM;AROM;AAROM;Left(educated to 45; to pain tolerance for functional ADL) Shoulder External Rotation: PROM;AROM;AAROM;Left(educated to 20; to pain tolerance for functional ADL) Elbow Flexion:  PROM;AROM;AAROM;Left;Seated Elbow Extension: PROM;AROM;AAROM;Left Wrist Flexion: AROM;AAROM;Left Wrist Extension: AROM;AAROM;Left Digit Composite Flexion: AROM;Left Composite Extension: AROM;Left Neck Flexion: AROM Neck Extension: AROM Neck Lateral Flexion - Right: AROM Neck Lateral Flexion - Left: AROM   Shoulder Instructions Shoulder Instructions Donning/doffing shirt without moving shoulder: Moderate assistance;Patient able to independently direct caregiver Method for sponge bathing under operated UE: Supervision/safety Donning/doffing sling/immobilizer: Moderate assistance;Patient able to independently direct caregiver Correct positioning of sling/immobilizer: Modified independent Pendulum exercises (written home exercise program): Supervision/safety ROM for elbow, wrist and digits of operated UE: Modified independent Sling wearing schedule (on at all times/off for ADL's): Modified independent Proper positioning of operated UE when showering: Supervision/safety Positioning of UE while sleeping: Minimal assistance;Patient able to independently direct caregiver    Home Living Family/patient expects to be discharged to:: Private residence Living Arrangements: Spouse/significant other Available Help at Discharge: Family;Available 24 hours/day Type of Home: House Home Access: Stairs to enter [employer] of Steps: 2 Entrance Stairs-Rails: Right Home Layout: Able to live on main level with bedroom/bathroom     Bathroom Shower/Tub: Walk-in shower;Door   [employer] Toilet: Handicapped height     Home Equipment: Shower seat - built in;Hand held shower head          Prior Functioning/Environment Level of Independence: Independent                 OT Problem List: Decreased range of motion;Decreased activity tolerance;Decreased knowledge of precautions;Impaired UE functional use;Pain      OT Treatment/Interventions:      OT Goals(Current goals can be found in  the care plan section) Acute Rehab OT Goals Patient Stated Goal: "get back to 100% for grandchildren" OT Goal Formulation: With patient Time For Goal Achievement: 06/27/18 Potential to Achieve Goals: Good  OT Frequency:     Barriers to D/C:            Co-evaluation              AM-PAC PT "6 Clicks" Daily Activity     Outcome Measure Help from another person eating meals?: A Little Help from another person taking care of personal grooming?: A Little Help from another person toileting, which includes using toliet, bedpan, or urinal?: A Little Help from another person bathing (including washing, rinsing, drying)?: A Little Help from another person to put on and taking off regular upper body clothing?: A Lot Help from another person to put on and taking off regular lower body clothing?: A Lot 6 Click Score: 16   End of Session Equipment Utilized During Treatment: Other (comment)(sling) Nurse Communication: Mobility status;Weight bearing status;Other (comment)(please saline lock - then ok for independent room mobility)  Activity Tolerance: Patient tolerated treatment well Patient left: in chair;with call bell/phone within reach  OT Visit Diagnosis: Pain Pain - Right/Left: Left Pain - part of body: Shoulder                Time: 7893-8101 OT Time Calculation (min): 38 min Charges:  OT General Charges $OT Visit: 1 Visit OT Evaluation $OT Eval Moderate Complexity: 1 Mod OT Treatments $Self Care/Home Management : 8-22 mins $Therapeutic Exercise: 8-22 mins  Hulda Humphrey OTR/L Acute  Rehabilitation Services Pager: [phone] Office: [place] 06/13/2018, 11:47 AM

## 2018-06-13 NOTE — Discharge Summary (Signed)
PATIENT ID:      Suzanne Conner  MRN:     [id number] DOB/AGE:    1946-04-18 / 72 y.o.     DISCHARGE SUMMARY  ADMISSION DATE:    06/12/2018 DISCHARGE DATE:    ADMISSION DIAGNOSIS: Left shoulder rotator cuff tear arthropathy Past Medical History:  Diagnosis Date  . Arthritis    "left hip; right wrist" (06/12/2018)  . Depression   . Family history of polyps in the colon   . GERD (gastroesophageal reflux disease)   . Glaucoma   . Hypertension   . Type 2 diabetes, diet controlled ([place])     DISCHARGE DIAGNOSIS:   Active Problems:   S/p reverse total shoulder arthroplasty   PROCEDURE: Procedure(s): LEFT REVERSE SHOULDER ARTHROPLASTY on 06/12/2018  CONSULTS:    HISTORY:  See H&P in chart.  HOSPITAL COURSE:  Suzanne Conner is a 72 y.o. admitted on 06/12/2018 with a diagnosis of Left shoulder rotator cuff tear arthropathy.  They were brought to the operating room on 06/12/2018 and underwent Procedure(s): LEFT REVERSE SHOULDER ARTHROPLASTY.    They were given perioperative antibiotics:  Anti-infectives (From admission, onward)   Start     Dose/Rate Route Frequency Ordered Stop   06/12/18 0600  ceFAZolin (ANCEF) IVPB 2g/100 mL premix     2 g 200 mL/hr over 30 Minutes Intravenous On call to O.R. 06/12/18 4034 06/12/18 7425    .  Patient underwent the above named procedure and tolerated it well. The following day they were hemodynamically stable and pain was controlled on oral analgesics. They were neurovascularly intact to the operative extremity. Her block only seemed to be affecting motor, sensation was intact. OT was ordered and worked with patient per protocol. They were medically and orthopaedically stable for discharge onday 1 .    DIAGNOSTIC STUDIES:  RECENT RADIOGRAPHIC STUDIES :  No results found.  RECENT VITAL SIGNS:   Patient Vitals for the past 24 hrs:  BP Temp Temp src Pulse Resp SpO2  06/12/18 1137 124/61 98.2 F (36.8 C) Oral 69 16 98 %  06/12/18 1100 (!)  117/57 - - 90 17 100 %  06/12/18 1045 115/63 (!) 97.3 F (36.3 C) - 81 14 100 %  06/12/18 1030 133/66 - - 82 13 100 %  06/12/18 1015 130/63 - - 80 17 100 %  06/12/18 1000 131/67 - - 88 12 98 %  06/12/18 0945 118/80 97.6 F (36.4 C) - 95 13 100 %  .  RECENT EKG RESULTS:    Orders placed or performed during the hospital encounter of 05/30/18  . EKG 12 lead  . EKG 12 lead  . EKG 12-Lead  . EKG 12-Lead    DISCHARGE INSTRUCTIONS:  Discharge Instructions    Discharge patient   Complete by:  As directed    Discharge disposition:  01-Home or Self Care   Discharge patient date:  06/13/2018      DISCHARGE MEDICATIONS:   Allergies as of 06/13/2018      Reactions   Penicillins Hives, Other (See Comments)   PATIENT HAS HAD A PCN REACTION WITH IMMEDIATE RASH, FACIAL/TONGUE/THROAT SWELLING, SOB, OR LIGHTHEADEDNESS WITH HYPOTENSION:  #  #  YES  #  #  INCLUDING HIVES AS A CHILD Has patient had a PCN reaction causing severe rash involving mucus membranes or skin necrosis: No Has patient had a PCN reaction that required hospitalization: No Has patient had a PCN reaction occurring within the last 10 years: No If all  of the above answers are "NO", then may proceed with Cephalosporin use.      Medication List    STOP taking these medications   meloxicam 15 MG tablet Commonly known as:  MOBIC     TAKE these medications   aspirin EC 81 MG tablet Take 81 mg by mouth daily.   CENTRUM SILVER PO Take 1 tablet by mouth daily.   methocarbamol 500 MG tablet Commonly known as:  ROBAXIN Take 1 tablet (500 mg total) by mouth every 8 (eight) hours as needed for muscle spasms.   naproxen 500 MG tablet Commonly known as:  NAPROSYN Take 1 tablet (500 mg total) by mouth 2 (two) times daily with a meal.   omeprazole 40 MG capsule Commonly known as:  PRILOSEC Take 1 capsule (40 mg total) by mouth 2 (two) times daily.   ondansetron 4 MG tablet Commonly known as:  ZOFRAN Take 1 tablet (4 mg  total) by mouth every 8 (eight) hours as needed for nausea or vomiting.   oxyCODONE-acetaminophen 5-325 MG tablet Commonly known as:  PERCOCET/ROXICET Take 1 tablet by mouth every 4 (four) hours as needed (max 6 q).   telmisartan-hydrochlorothiazide 80-12.5 MG tablet Commonly known as:  MICARDIS HCT Take 1 tablet by mouth daily.   TRUNATURE DIGESTIVE PROBIOTIC Caps Take 1 capsule by mouth daily.   Vitamin D 2000 units Caps Take 2,000 Units by mouth daily.       FOLLOW UP VISIT:   Follow-up Information    Justice Britain, MD.   Specialty:  Orthopedic Surgery Why:  call to be seen in 10-14 days Contact information: [address] [place] [address] [phone]           DISCHARGE TO: Home   DISCHARGE CONDITION:  Thereasa Parkin Suzanne Conner for Dr. Justice Britain 06/13/2018, 8:12 AM

## 2018-06-25 DIAGNOSIS — Z4731 Aftercare following explantation of shoulder joint prosthesis: Secondary | ICD-10-CM | POA: Diagnosis not present

## 2018-06-26 DIAGNOSIS — M25552 Pain in left hip: Secondary | ICD-10-CM | POA: Diagnosis not present

## 2018-07-03 DIAGNOSIS — M25512 Pain in left shoulder: Secondary | ICD-10-CM | POA: Diagnosis not present

## 2018-07-09 DIAGNOSIS — M25512 Pain in left shoulder: Secondary | ICD-10-CM | POA: Diagnosis not present

## 2018-07-28 DIAGNOSIS — M7062 Trochanteric bursitis, left hip: Secondary | ICD-10-CM | POA: Diagnosis not present

## 2018-07-28 DIAGNOSIS — M25552 Pain in left hip: Secondary | ICD-10-CM | POA: Diagnosis not present

## 2018-07-28 DIAGNOSIS — M25512 Pain in left shoulder: Secondary | ICD-10-CM | POA: Diagnosis not present

## 2018-07-30 DIAGNOSIS — M25512 Pain in left shoulder: Secondary | ICD-10-CM | POA: Diagnosis not present

## 2018-07-31 DIAGNOSIS — H2512 Age-related nuclear cataract, left eye: Secondary | ICD-10-CM | POA: Diagnosis not present

## 2018-07-31 DIAGNOSIS — H25012 Cortical age-related cataract, left eye: Secondary | ICD-10-CM | POA: Diagnosis not present

## 2018-07-31 DIAGNOSIS — H35362 Drusen (degenerative) of macula, left eye: Secondary | ICD-10-CM | POA: Diagnosis not present

## 2018-07-31 DIAGNOSIS — H401134 Primary open-angle glaucoma, bilateral, indeterminate stage: Secondary | ICD-10-CM | POA: Diagnosis not present

## 2018-08-04 DIAGNOSIS — M25512 Pain in left shoulder: Secondary | ICD-10-CM | POA: Diagnosis not present

## 2018-08-11 DIAGNOSIS — M25512 Pain in left shoulder: Secondary | ICD-10-CM | POA: Diagnosis not present

## 2018-08-12 DIAGNOSIS — H26491 Other secondary cataract, right eye: Secondary | ICD-10-CM | POA: Diagnosis not present

## 2018-08-22 DIAGNOSIS — M25552 Pain in left hip: Secondary | ICD-10-CM | POA: Diagnosis not present

## 2018-08-29 DIAGNOSIS — M7602 Gluteal tendinitis, left hip: Secondary | ICD-10-CM | POA: Diagnosis not present

## 2018-08-29 DIAGNOSIS — M25552 Pain in left hip: Secondary | ICD-10-CM | POA: Diagnosis not present

## 2018-09-02 DIAGNOSIS — M25512 Pain in left shoulder: Secondary | ICD-10-CM | POA: Diagnosis not present

## 2018-09-05 DIAGNOSIS — Z96612 Presence of left artificial shoulder joint: Secondary | ICD-10-CM | POA: Diagnosis not present

## 2018-09-05 DIAGNOSIS — Z4789 Encounter for other orthopedic aftercare: Secondary | ICD-10-CM | POA: Diagnosis not present

## 2018-09-15 DIAGNOSIS — H04123 Dry eye syndrome of bilateral lacrimal glands: Secondary | ICD-10-CM | POA: Diagnosis not present

## 2018-09-15 DIAGNOSIS — H401132 Primary open-angle glaucoma, bilateral, moderate stage: Secondary | ICD-10-CM | POA: Diagnosis not present

## 2018-09-15 DIAGNOSIS — H5310 Unspecified subjective visual disturbances: Secondary | ICD-10-CM | POA: Diagnosis not present

## 2018-09-19 DIAGNOSIS — M25552 Pain in left hip: Secondary | ICD-10-CM | POA: Diagnosis not present

## 2018-09-23 DIAGNOSIS — I83813 Varicose veins of bilateral lower extremities with pain: Secondary | ICD-10-CM | POA: Diagnosis not present

## 2018-09-23 DIAGNOSIS — I8311 Varicose veins of right lower extremity with inflammation: Secondary | ICD-10-CM | POA: Diagnosis not present

## 2018-09-23 DIAGNOSIS — I8312 Varicose veins of left lower extremity with inflammation: Secondary | ICD-10-CM | POA: Diagnosis not present

## 2018-09-23 DIAGNOSIS — I83893 Varicose veins of bilateral lower extremities with other complications: Secondary | ICD-10-CM | POA: Diagnosis not present

## 2018-09-25 DIAGNOSIS — M25552 Pain in left hip: Secondary | ICD-10-CM | POA: Diagnosis not present

## 2018-10-01 DIAGNOSIS — M25552 Pain in left hip: Secondary | ICD-10-CM | POA: Diagnosis not present

## 2018-10-09 DIAGNOSIS — M545 Low back pain: Secondary | ICD-10-CM | POA: Diagnosis not present

## 2018-10-09 DIAGNOSIS — M7062 Trochanteric bursitis, left hip: Secondary | ICD-10-CM | POA: Diagnosis not present

## 2018-10-09 DIAGNOSIS — M25552 Pain in left hip: Secondary | ICD-10-CM | POA: Diagnosis not present

## 2018-10-09 DIAGNOSIS — M5136 Other intervertebral disc degeneration, lumbar region: Secondary | ICD-10-CM | POA: Diagnosis not present

## 2018-10-14 DIAGNOSIS — I83813 Varicose veins of bilateral lower extremities with pain: Secondary | ICD-10-CM | POA: Diagnosis not present

## 2018-10-14 DIAGNOSIS — I83893 Varicose veins of bilateral lower extremities with other complications: Secondary | ICD-10-CM | POA: Diagnosis not present

## 2018-10-14 DIAGNOSIS — I8311 Varicose veins of right lower extremity with inflammation: Secondary | ICD-10-CM | POA: Diagnosis not present

## 2018-10-14 DIAGNOSIS — I8312 Varicose veins of left lower extremity with inflammation: Secondary | ICD-10-CM | POA: Diagnosis not present

## 2018-10-15 DIAGNOSIS — M25512 Pain in left shoulder: Secondary | ICD-10-CM | POA: Diagnosis not present

## 2018-10-17 DIAGNOSIS — M545 Low back pain: Secondary | ICD-10-CM | POA: Diagnosis not present

## 2018-10-17 DIAGNOSIS — I8312 Varicose veins of left lower extremity with inflammation: Secondary | ICD-10-CM | POA: Diagnosis not present

## 2018-10-17 DIAGNOSIS — I8311 Varicose veins of right lower extremity with inflammation: Secondary | ICD-10-CM | POA: Diagnosis not present

## 2018-10-17 DIAGNOSIS — I8002 Phlebitis and thrombophlebitis of superficial vessels of left lower extremity: Secondary | ICD-10-CM | POA: Diagnosis not present

## 2018-10-24 DIAGNOSIS — M5136 Other intervertebral disc degeneration, lumbar region: Secondary | ICD-10-CM | POA: Diagnosis not present

## 2018-11-05 DIAGNOSIS — I8312 Varicose veins of left lower extremity with inflammation: Secondary | ICD-10-CM | POA: Diagnosis not present

## 2018-11-05 DIAGNOSIS — I8311 Varicose veins of right lower extremity with inflammation: Secondary | ICD-10-CM | POA: Diagnosis not present

## 2018-11-13 DIAGNOSIS — I8312 Varicose veins of left lower extremity with inflammation: Secondary | ICD-10-CM | POA: Diagnosis not present

## 2018-11-13 DIAGNOSIS — I83892 Varicose veins of left lower extremities with other complications: Secondary | ICD-10-CM | POA: Diagnosis not present

## 2018-11-15 DIAGNOSIS — M5136 Other intervertebral disc degeneration, lumbar region: Secondary | ICD-10-CM | POA: Diagnosis not present

## 2018-11-17 DIAGNOSIS — I8312 Varicose veins of left lower extremity with inflammation: Secondary | ICD-10-CM | POA: Diagnosis not present

## 2018-11-17 DIAGNOSIS — I83892 Varicose veins of left lower extremities with other complications: Secondary | ICD-10-CM | POA: Diagnosis not present

## 2018-11-18 DIAGNOSIS — H5310 Unspecified subjective visual disturbances: Secondary | ICD-10-CM | POA: Diagnosis not present

## 2018-11-18 DIAGNOSIS — H43391 Other vitreous opacities, right eye: Secondary | ICD-10-CM | POA: Diagnosis not present

## 2018-11-23 ENCOUNTER — Other Ambulatory Visit: Payer: Self-pay | Admitting: Family Medicine

## 2018-11-23 DIAGNOSIS — K219 Gastro-esophageal reflux disease without esophagitis: Secondary | ICD-10-CM

## 2019-01-16 DIAGNOSIS — Z20828 Contact with and (suspected) exposure to other viral communicable diseases: Secondary | ICD-10-CM | POA: Diagnosis not present

## 2019-01-29 ENCOUNTER — Ambulatory Visit (INDEPENDENT_AMBULATORY_CARE_PROVIDER_SITE_OTHER): Payer: Medicare Other | Admitting: Family Medicine

## 2019-01-29 ENCOUNTER — Other Ambulatory Visit: Payer: Self-pay

## 2019-01-29 ENCOUNTER — Encounter: Payer: Self-pay | Admitting: Family Medicine

## 2019-01-29 DIAGNOSIS — I10 Essential (primary) hypertension: Secondary | ICD-10-CM

## 2019-01-29 DIAGNOSIS — K219 Gastro-esophageal reflux disease without esophagitis: Secondary | ICD-10-CM | POA: Diagnosis not present

## 2019-01-29 MED ORDER — OMEPRAZOLE 40 MG PO CPDR: 40.0000 mg | DELAYED_RELEASE_CAPSULE | Freq: Two times a day (BID) | ORAL | 3 refills | Status: DC

## 2019-01-29 MED ORDER — TELMISARTAN-HCTZ 80-12.5 MG PO TABS: 1.0000 | ORAL_TABLET | Freq: Every day | ORAL | 3 refills | Status: DC

## 2019-01-29 NOTE — Progress Notes (Signed)
Virtual Visit via Telephone Note  I connected with Suzanne Conner on 01/29/19 at  1:30 PM EDT by telephone and verified that I am speaking with the correct person using two identifiers.   I discussed the limitations, risks, security and privacy concerns of performing an evaluation and management service by telephone and the availability of in person appointments. I also discussed with the patient that there may be a patient responsible charge related to this service. The patient expressed understanding and agreed to proceed.  Location patient: home Location provider: work or home office Participants present for the call: patient, provider Patient did not have a visit in the prior 7 days to address this/these issue(s).   History of Present Illness: Pt states she is doing well.  Since last OFV, had total shoulder replacement, in PT prior to COVID-19 pandemic. Her only remaining sibling (a sister) died in 08-07-2023.  Pt dealing with COVID-19 pandemic as best she can.  States eating has increased 2/2 stress/anxiety.  Unable to exercise as much, did get an exercise bike.  Will use the bike for 15 min BID.  Watching tv and netflix with her husband.  Pt states it is difficult not being able to see her grandchildren (one is down the street, the other in [place] just graduated.  Pt requesting refills on micardis and omeprazole.  Plans to schedule an eye exam maybe in June.   Observations/Objective: Patient sounds cheerful and well on the phone. I do not appreciate any SOB. Speech and thought processing are grossly intact. Patient reported vitals:  Assessment and Plan: Essential hypertension  - Plan: telmisartan-hydrochlorothiazide (MICARDIS HCT) 80-12.5 MG tablet  Gastroesophageal reflux disease, esophagitis presence not specified  - Plan: omeprazole (PRILOSEC) 40 MG capsule    Follow Up Instructions: F/u prn  I did not refer this patient for an OV in the next 24 hours for this/these  issue(s).  I discussed the assessment and treatment plan with the patient. The patient was provided an opportunity to ask questions and all were answered. The patient agreed with the plan and demonstrated an understanding of the instructions.   The patient was advised to call back or seek an in-person evaluation if the symptoms worsen or if the condition fails to improve as anticipated.  I provided 11 minutes of non-face-to-face time during this encounter.   Billie Ruddy, MD

## 2019-03-02 DIAGNOSIS — H43811 Vitreous degeneration, right eye: Secondary | ICD-10-CM | POA: Diagnosis not present

## 2019-03-02 DIAGNOSIS — H2512 Age-related nuclear cataract, left eye: Secondary | ICD-10-CM | POA: Diagnosis not present

## 2019-03-02 DIAGNOSIS — H43822 Vitreomacular adhesion, left eye: Secondary | ICD-10-CM | POA: Diagnosis not present

## 2019-03-02 DIAGNOSIS — H43311 Vitreous membranes and strands, right eye: Secondary | ICD-10-CM | POA: Diagnosis not present

## 2019-03-02 DIAGNOSIS — H25012 Cortical age-related cataract, left eye: Secondary | ICD-10-CM | POA: Diagnosis not present

## 2019-03-04 ENCOUNTER — Telehealth: Payer: Self-pay | Admitting: Family Medicine

## 2019-03-04 NOTE — Telephone Encounter (Unsigned)
Copied from [place] [phone]. Topic: General - Inquiry >> Mar 04, 2019  1:39 PM Mathis Bud wrote: Reason for CRM: patient is checking on the status of her shingles vaccines shot. Patient call back# [phone]

## 2019-03-04 NOTE — Telephone Encounter (Signed)
Patient needs medication refill sent to different pharmacy due to her current pharmacy is out of telmisartan-hydrochlorothiazide (MICARDIS HCT) 80-12.5 MG  Pharmacy: [facility]: [place], [place] [address] [phone]

## 2019-03-05 ENCOUNTER — Other Ambulatory Visit: Payer: Self-pay

## 2019-03-05 DIAGNOSIS — I10 Essential (primary) hypertension: Secondary | ICD-10-CM

## 2019-03-05 MED ORDER — TELMISARTAN-HCTZ 80-12.5 MG PO TABS: 1.0000 | ORAL_TABLET | Freq: Every day | ORAL | 0 refills | Status: DC

## 2019-03-05 NOTE — Telephone Encounter (Signed)
Spoke with pt verbalized understanding to call her insurance for shingrix immunization, aware that over age 72 yrs receives injection at the pharmacy. Pt Tel[facility]artan-HZTC [facility] but pt says its on back order, a 90 days supply was sent to pt local pharmacy

## 2019-03-06 NOTE — Telephone Encounter (Unsigned)
Copied from [place] [phone]. Topic: Appointment Scheduling - Scheduling Inquiry for Clinic >> Feb 17, 2019 11:59 AM Margot Ables wrote: Reason for CRM: Pt called to inquire on getting shingles vaccine and booster. She had one about 8 years ago. Please advise if approved and pt can be scheduled for nurse visits.

## 2019-03-09 DIAGNOSIS — Z96612 Presence of left artificial shoulder joint: Secondary | ICD-10-CM | POA: Diagnosis not present

## 2019-03-09 DIAGNOSIS — M654 Radial styloid tenosynovitis [de Quervain]: Secondary | ICD-10-CM | POA: Diagnosis not present

## 2019-03-09 DIAGNOSIS — Z471 Aftercare following joint replacement surgery: Secondary | ICD-10-CM | POA: Diagnosis not present

## 2019-03-10 DIAGNOSIS — M654 Radial styloid tenosynovitis [de Quervain]: Secondary | ICD-10-CM | POA: Diagnosis not present

## 2019-03-10 DIAGNOSIS — M25531 Pain in right wrist: Secondary | ICD-10-CM | POA: Diagnosis not present

## 2019-04-07 DIAGNOSIS — M654 Radial styloid tenosynovitis [de Quervain]: Secondary | ICD-10-CM | POA: Diagnosis not present

## 2019-04-20 DIAGNOSIS — H401132 Primary open-angle glaucoma, bilateral, moderate stage: Secondary | ICD-10-CM | POA: Diagnosis not present

## 2019-04-20 DIAGNOSIS — H25012 Cortical age-related cataract, left eye: Secondary | ICD-10-CM | POA: Diagnosis not present

## 2019-04-20 DIAGNOSIS — H532 Diplopia: Secondary | ICD-10-CM | POA: Diagnosis not present

## 2019-04-20 DIAGNOSIS — Z961 Presence of intraocular lens: Secondary | ICD-10-CM | POA: Diagnosis not present

## 2019-06-12 ENCOUNTER — Telehealth (INDEPENDENT_AMBULATORY_CARE_PROVIDER_SITE_OTHER): Payer: Medicare Other | Admitting: Family Medicine

## 2019-06-12 ENCOUNTER — Other Ambulatory Visit: Payer: Self-pay

## 2019-06-12 DIAGNOSIS — L309 Dermatitis, unspecified: Secondary | ICD-10-CM

## 2019-06-12 DIAGNOSIS — E118 Type 2 diabetes mellitus with unspecified complications: Secondary | ICD-10-CM

## 2019-06-12 DIAGNOSIS — I10 Essential (primary) hypertension: Secondary | ICD-10-CM | POA: Diagnosis not present

## 2019-06-12 DIAGNOSIS — Z8659 Personal history of other mental and behavioral disorders: Secondary | ICD-10-CM

## 2019-06-12 MED ORDER — HYDROCORTISONE VALERATE 0.2 % EX CREA: 1.0000 "application " | TOPICAL_CREAM | Freq: Two times a day (BID) | CUTANEOUS | 0 refills | Status: DC

## 2019-06-12 MED ORDER — TELMISARTAN-HCTZ 80-12.5 MG PO TABS: 1.0000 | ORAL_TABLET | Freq: Every day | ORAL | 0 refills | Status: DC

## 2019-06-12 NOTE — Progress Notes (Signed)
Virtual Visit via Video Note  I connected with Suzanne Conner on 06/12/19 at  8:00 AM EDT by a video enabled telemedicine application 2/2 [id number] pandemic and verified that I am speaking with the correct person using two identifiers.  Location patient: home Location provider:work or home office Persons participating in the virtual visit: patient, provider  I discussed the limitations of evaluation and management by telemedicine and the availability of in person appointments. The patient expressed understanding and agreed to proceed.   HPI: Pt states she is doing well overall.  She needs refills on micardis and westcort cream to [employer].  BP at home has been 130/70s.  Pt is not getting any exercise.  Uses cream on several dry spots when they occur.  States has been staying home with her husband since quarantine as she has been afraid.  Pt states it is hard not being able to interact with her young grandson who lives nearby.    Also needs to get "fasting lab work" done.  States she is used to getting every 6 months.   ROS: See pertinent positives and negatives per HPI.  Past Medical History:  Diagnosis Date  . Arthritis    "left hip; right wrist" (06/12/2018)  . Depression   . Family history of polyps in the colon   . GERD (gastroesophageal reflux disease)   . Glaucoma   . Hypertension   . Type 2 diabetes, diet controlled ([place])     Past Surgical History:  Procedure Laterality Date  . BUNIONECTOMY Right   . CATARACT EXTRACTION W/ INTRAOCULAR LENS IMPLANT Right   . JOINT REPLACEMENT    . REPLACEMENT TOTAL KNEE Right 2015  . REVERSE SHOULDER ARTHROPLASTY Left 06/12/2018  . REVERSE SHOULDER ARTHROPLASTY Left 06/12/2018   Procedure: LEFT REVERSE SHOULDER ARTHROPLASTY;  Surgeon: Justice Britain, MD;  Location: [facility];  Service: Orthopedics;  Laterality: Left;  . SHOULDER ARTHROSCOPY W/ ROTATOR CUFF REPAIR Right 2015  . TUBAL LIGATION      No family history on  file.    Current Outpatient Medications:  .  aspirin EC 81 MG tablet, Take 81 mg by mouth daily., Disp: , Rfl:  .  Cholecalciferol (VITAMIN D) 2000 units CAPS, Take 2,000 Units by mouth daily., Disp: , Rfl:  .  methocarbamol (ROBAXIN) 500 MG tablet, Take 1 tablet (500 mg total) by mouth every 8 (eight) hours as needed for muscle spasms., Disp: 30 tablet, Rfl: 1 .  Multiple Vitamins-Minerals (CENTRUM SILVER PO), Take 1 tablet by mouth daily. , Disp: , Rfl:  .  naproxen (NAPROSYN) 500 MG tablet, Take 1 tablet (500 mg total) by mouth 2 (two) times daily with a meal., Disp: 60 tablet, Rfl: 1 .  omeprazole (PRILOSEC) 40 MG capsule, Take 1 capsule (40 mg total) by mouth 2 (two) times daily., Disp: 180 capsule, Rfl: 3 .  ondansetron (ZOFRAN) 4 MG tablet, Take 1 tablet (4 mg total) by mouth every 8 (eight) hours as needed for nausea or vomiting., Disp: 10 tablet, Rfl: 0 .  oxyCODONE-acetaminophen (PERCOCET) 5-325 MG tablet, Take 1 tablet by mouth every 4 (four) hours as needed (max 6 q)., Disp: 30 tablet, Rfl: 0 .  Probiotic Product (TRUNATURE DIGESTIVE PROBIOTIC) CAPS, Take 1 capsule by mouth daily. , Disp: , Rfl:  .  telmisartan-hydrochlorothiazide (MICARDIS HCT) 80-12.5 MG tablet, Take 1 tablet by mouth daily., Disp: 90 tablet, Rfl: 0  EXAM:  VITALS per patient if applicable:  GENERAL: alert, oriented, appears well and in no  acute distress.  Pt tearful at one point during visit.  HEENT: atraumatic, conjunctiva clear, no obvious abnormalities on inspection of external nose and ears  NECK: normal movements of the head and neck  LUNGS: on inspection no signs of respiratory distress, breathing rate appears normal, no obvious gross SOB, gasping or wheezing  CV: no obvious cyanosis  MS: moves all visible extremities without noticeable abnormality  PSYCH/NEURO: pleasant and cooperative, no obvious depression or anxiety, speech and thought processing grossly intact  ASSESSMENT AND  PLAN:  Discussed the following assessment and plan:  Essential hypertension  -lifestyle modifications encouraged. -continue checking bp at home -will f/u in clinic in the next few wks - Plan: telmisartan-hydrochlorothiazide (MICARDIS HCT) 80-12.5 MG tablet, Basic metabolic panel  Dermatitis -westcort cream sparingly  Controlled type 2 diabetes mellitus with complication, without long-term current use of insulin (HCC)  -diet controlled -last Hgb A1C 5.7% 05/2018 -continue lifestyle modifications - Plan: Hemoglobin A1c, Lipid panel  History of depression -discussed ways to relieve stress.  Pt encouraged to go walking in her neighborhood. -consider counseling -will continue to monitor.   Pt to schedule a lab appointment next wk.  I discussed the assessment and treatment plan with the patient. The patient was provided an opportunity to ask questions and all were answered. The patient agreed with the plan and demonstrated an understanding of the instructions.   The patient was advised to call back or seek an in-person evaluation if the symptoms worsen or if the condition fails to improve as anticipated.   Billie Ruddy, MD

## 2019-06-17 ENCOUNTER — Other Ambulatory Visit (INDEPENDENT_AMBULATORY_CARE_PROVIDER_SITE_OTHER): Payer: Medicare Other

## 2019-06-17 ENCOUNTER — Other Ambulatory Visit: Payer: Self-pay

## 2019-06-17 DIAGNOSIS — I10 Essential (primary) hypertension: Secondary | ICD-10-CM | POA: Diagnosis not present

## 2019-06-17 DIAGNOSIS — E118 Type 2 diabetes mellitus with unspecified complications: Secondary | ICD-10-CM | POA: Diagnosis not present

## 2019-06-17 LAB — BASIC METABOLIC PANEL
BUN: 14 mg/dL (ref 6–23)
CO2: 30 mEq/L (ref 19–32)
Calcium: 9.4 mg/dL (ref 8.4–10.5)
Chloride: 103 mEq/L (ref 96–112)
Creatinine, Ser: 0.59 mg/dL (ref 0.40–1.20)
GFR: 121.27 mL/min (ref >60.00)
Glucose, Bld: 92 mg/dL (ref 70–99)
Potassium: 4.3 mEq/L (ref 3.5–5.1)
Sodium: 142 mEq/L (ref 135–145)

## 2019-06-17 LAB — LIPID PANEL
Cholesterol: 120 mg/dL (ref 0–200)
HDL: 41.1 mg/dL (ref >39.00)
LDL Cholesterol: 63 mg/dL (ref 0–99)
NonHDL: 79.36
Total CHOL/HDL Ratio: 3
Triglycerides: 80 mg/dL (ref 0.0–149.0)
VLDL: 16 mg/dL (ref 0.0–40.0)

## 2019-06-17 LAB — HEMOGLOBIN A1C: Hgb A1c MFr Bld: 6 % (ref 4.6–6.5)

## 2019-06-18 ENCOUNTER — Telehealth: Payer: Self-pay

## 2019-08-11 DIAGNOSIS — M654 Radial styloid tenosynovitis [de Quervain]: Secondary | ICD-10-CM | POA: Diagnosis not present

## 2019-08-25 ENCOUNTER — Other Ambulatory Visit: Payer: Self-pay | Admitting: Family Medicine

## 2019-08-25 DIAGNOSIS — I10 Essential (primary) hypertension: Secondary | ICD-10-CM

## 2019-10-01 ENCOUNTER — Ambulatory Visit: Payer: Medicare Other | Attending: Internal Medicine

## 2019-10-01 DIAGNOSIS — Z23 Encounter for immunization: Secondary | ICD-10-CM | POA: Insufficient documentation

## 2019-10-01 NOTE — Progress Notes (Signed)
   Covid-19 Vaccination Clinic  Name:  SARIA CHRISTNER    MRN: [id number] DOB: 1946-08-29  10/01/2019  Ms. Policastro was observed post Covid-19 immunization for 15 minutes without incidence. She was provided with Vaccine Information Sheet and instruction to access the V-Safe system.   Ms. Goepfert was instructed to call 911 with any severe reactions post vaccine: Marland Kitchen Difficulty breathing  . Swelling of your face and throat  . A fast heartbeat  . A bad rash all over your body  . Dizziness and weakness    Immunizations Administered    Name Date Dose VIS Date Route   Pfizer COVID-19 Vaccine 10/01/2019 12:49 PM 0.3 mL 08/21/2019 Intramuscular   Manufacturer: [place]   Lot: [id number]   [place]: [id number]

## 2019-10-22 ENCOUNTER — Ambulatory Visit: Payer: Medicare Other | Attending: Internal Medicine

## 2019-10-22 DIAGNOSIS — Z23 Encounter for immunization: Secondary | ICD-10-CM | POA: Insufficient documentation

## 2019-10-22 NOTE — Progress Notes (Signed)
   Covid-19 Vaccination Clinic  Name:  Suzanne Conner    MRN: [id number] DOB: Jul 07, 1946  10/22/2019  Suzanne Conner was observed post Covid-19 immunization for 15 minutes without incidence. She was provided with Vaccine Information Sheet and instruction to access the V-Safe system.   Suzanne Conner was instructed to call 911 with any severe reactions post vaccine: Marland Kitchen Difficulty breathing  . Swelling of your face and throat  . A fast heartbeat  . A bad rash all over your body  . Dizziness and weakness    Immunizations Administered    Name Date Dose VIS Date Route   Pfizer COVID-19 Vaccine 10/22/2019 12:52 PM 0.3 mL 08/21/2019 Intramuscular   Manufacturer: [place]   Lot: [id number]   [place]: [id number]

## 2019-11-13 NOTE — Telephone Encounter (Signed)
This encounter is complete

## 2019-11-17 DIAGNOSIS — H532 Diplopia: Secondary | ICD-10-CM | POA: Diagnosis not present

## 2019-11-17 DIAGNOSIS — H25012 Cortical age-related cataract, left eye: Secondary | ICD-10-CM | POA: Diagnosis not present

## 2019-11-17 DIAGNOSIS — H401132 Primary open-angle glaucoma, bilateral, moderate stage: Secondary | ICD-10-CM | POA: Diagnosis not present

## 2019-11-17 DIAGNOSIS — H2512 Age-related nuclear cataract, left eye: Secondary | ICD-10-CM | POA: Diagnosis not present

## 2019-11-17 LAB — HM DIABETES EYE EXAM

## 2020-01-04 DIAGNOSIS — M25562 Pain in left knee: Secondary | ICD-10-CM | POA: Diagnosis not present

## 2020-01-04 DIAGNOSIS — M94262 Chondromalacia, left knee: Secondary | ICD-10-CM | POA: Diagnosis not present

## 2020-01-04 DIAGNOSIS — M238X2 Other internal derangements of left knee: Secondary | ICD-10-CM | POA: Diagnosis not present

## 2020-01-04 DIAGNOSIS — M2392 Unspecified internal derangement of left knee: Secondary | ICD-10-CM | POA: Diagnosis not present

## 2020-01-22 DIAGNOSIS — H532 Diplopia: Secondary | ICD-10-CM | POA: Diagnosis not present

## 2020-01-22 DIAGNOSIS — H518 Other specified disorders of binocular movement: Secondary | ICD-10-CM | POA: Diagnosis not present

## 2020-01-22 DIAGNOSIS — H5005 Alternating esotropia: Secondary | ICD-10-CM | POA: Diagnosis not present

## 2020-01-26 DIAGNOSIS — Z01812 Encounter for preprocedural laboratory examination: Secondary | ICD-10-CM | POA: Diagnosis not present

## 2020-01-26 DIAGNOSIS — Z20822 Contact with and (suspected) exposure to covid-19: Secondary | ICD-10-CM | POA: Diagnosis not present

## 2020-01-29 DIAGNOSIS — E109 Type 1 diabetes mellitus without complications: Secondary | ICD-10-CM | POA: Diagnosis not present

## 2020-01-29 DIAGNOSIS — K219 Gastro-esophageal reflux disease without esophagitis: Secondary | ICD-10-CM | POA: Diagnosis not present

## 2020-01-29 DIAGNOSIS — Z7982 Long term (current) use of aspirin: Secondary | ICD-10-CM | POA: Diagnosis not present

## 2020-01-29 DIAGNOSIS — H5005 Alternating esotropia: Secondary | ICD-10-CM | POA: Diagnosis not present

## 2020-01-29 DIAGNOSIS — I10 Essential (primary) hypertension: Secondary | ICD-10-CM | POA: Diagnosis not present

## 2020-01-29 DIAGNOSIS — E669 Obesity, unspecified: Secondary | ICD-10-CM | POA: Diagnosis not present

## 2020-01-29 DIAGNOSIS — Z79899 Other long term (current) drug therapy: Secondary | ICD-10-CM | POA: Diagnosis not present

## 2020-01-29 DIAGNOSIS — H518 Other specified disorders of binocular movement: Secondary | ICD-10-CM | POA: Diagnosis not present

## 2020-03-07 ENCOUNTER — Ambulatory Visit: Payer: Medicare Other | Admitting: Family Medicine

## 2020-03-11 ENCOUNTER — Ambulatory Visit: Payer: Medicare Other | Admitting: Family Medicine

## 2020-03-18 ENCOUNTER — Encounter: Payer: Self-pay | Admitting: Family Medicine

## 2020-03-18 ENCOUNTER — Ambulatory Visit (INDEPENDENT_AMBULATORY_CARE_PROVIDER_SITE_OTHER): Payer: Medicare Other | Admitting: Family Medicine

## 2020-03-18 ENCOUNTER — Other Ambulatory Visit: Payer: Self-pay

## 2020-03-18 VITALS — BP 140/70 | HR 84 | Temp 97.1°F | Ht 65.0 in | Wt 203.1 lb

## 2020-03-18 DIAGNOSIS — E118 Type 2 diabetes mellitus with unspecified complications: Secondary | ICD-10-CM

## 2020-03-18 DIAGNOSIS — H509 Unspecified strabismus: Secondary | ICD-10-CM | POA: Diagnosis not present

## 2020-03-18 DIAGNOSIS — L409 Psoriasis, unspecified: Secondary | ICD-10-CM

## 2020-03-18 DIAGNOSIS — G4700 Insomnia, unspecified: Secondary | ICD-10-CM

## 2020-03-18 DIAGNOSIS — Z0000 Encounter for general adult medical examination without abnormal findings: Secondary | ICD-10-CM

## 2020-03-18 DIAGNOSIS — R635 Abnormal weight gain: Secondary | ICD-10-CM | POA: Diagnosis not present

## 2020-03-18 DIAGNOSIS — F320 Major depressive disorder, single episode, mild: Secondary | ICD-10-CM | POA: Diagnosis not present

## 2020-03-18 DIAGNOSIS — M2550 Pain in unspecified joint: Secondary | ICD-10-CM | POA: Diagnosis not present

## 2020-03-18 DIAGNOSIS — I10 Essential (primary) hypertension: Secondary | ICD-10-CM

## 2020-03-18 DIAGNOSIS — F419 Anxiety disorder, unspecified: Secondary | ICD-10-CM

## 2020-03-18 DIAGNOSIS — G8929 Other chronic pain: Secondary | ICD-10-CM

## 2020-03-18 MED ORDER — CLOBETASOL PROPIONATE 0.05 % EX SHAM: MEDICATED_SHAMPOO | CUTANEOUS | 0 refills | Status: DC

## 2020-03-18 MED ORDER — PAROXETINE HCL 20 MG PO TABS: 20.0000 mg | ORAL_TABLET | Freq: Every day | ORAL | 1 refills | Status: DC

## 2020-03-18 NOTE — Progress Notes (Signed)
Subjective:   Suzanne Conner is a 74 y.o. female who presents for Medicare Annual (Subsequent) preventive examination.  HTN- bp typically 130-140/60-70s. Denies dizziness  Strabismus-  Had double vision x 1 yr.  Seen at [facility], had eye surgery 1 month ago  Joint concerns- seen by Rheumatology in [place] 5 yrs ago.  Notes edema in hands, feet, knee, and hip.  Has stiffness in am that improves throughout the day.  Pt denies personal h/o or family h/o autoimmune d/o.  May take aleeve or a warm bath.  Anxiety/Depression-patient endorses feeling isolated as unable to leave the house much more visible grandkids who live close by 2/2 COVID-19 pandemic.  Endorses difficulty sleeping, weight gain.  Skin concern:  Pt notes increasing psoriasis of scalp.  DM II: Not currently on meds.  Typically exercises, but has been unable 2/2 COVID-19 pandemic  Review of Systems    A 13 point review of systems negative unless stated as above.    Objective:    Today's Vitals   03/18/20 0928  BP: 140/70  Pulse: 84  Temp: (!) 97.1 F (36.2 C)  TempSrc: Temporal  SpO2: 96%  Weight: 203 lb 2 oz (92.1 kg)  Height: 5' 5" (1.651 m)   Body mass index is 33.8 kg/m.   Gen. Pleasant, well developed, well-nourished, in NAD HEENT - [place]/AT, PERRL, EOMI, conjunctive clear, no scleral icterus, no nasal drainage, pharynx without erythema or exudate. TMs normal b/l. Neck: No JVD, no thyromegaly, no carotid bruits Lungs: no use of accessory muscles, CTAB, no wheezes, rales or rhonchi Cardiovascular: RRR, No r/g/m, no peripheral edema Abdomen: BS present, soft, nontender,nondistended Musculoskeletal: No deformities, moves all four extremities, no cyanosis or clubbing, normal tone Neuro:  A&Ox3, CN II-XII intact, normal gait Skin:  Warm, dry, intact, no lesions Psych: mildly depressed mood, flattened affect.   Advanced Directives 03/18/2020 06/12/2018 05/30/2018  Does Patient Have a Medical Advance Directive? No Yes  Yes  Type of Advance Directive - Healthcare Power of Green  Does patient want to make changes to medical advance directive? - No - Patient declined -  Copy of [place] in Chart? - No - copy requested No - copy requested    Current Medications (verified) Outpatient Encounter Medications as of 03/18/2020  Medication Sig  . aspirin EC 81 MG tablet Take 81 mg by mouth daily.  . Cholecalciferol (VITAMIN D) 2000 units CAPS Take 2,000 Units by mouth daily.  . hydrocortisone valerate cream (WESTCORT) 0.2 % Apply 1 application topically 2 (two) times daily.  . Influenza Vac High-Dose Quad (FLUZONE HIGH-DOSE QUADRIVALENT) 0.7 ML SUSY Fluzone High-Dose Quad 2020-21 (PF) 240 mcg/0.7 mL IM syringe  . latanoprost (XALATAN) 0.005 % ophthalmic solution Apply to eye.  Marland Kitchen MICARDIS HCT 80-12.5 MG tablet TAKE 1 TABLET DAILY  . Multiple Vitamins-Minerals (CENTRUM SILVER PO) Take 1 tablet by mouth daily.   Marland Kitchen omeprazole (PRILOSEC) 40 MG capsule Take 1 capsule (40 mg total) by mouth 2 (two) times daily.  . Probiotic Product (TRUNATURE DIGESTIVE PROBIOTIC) CAPS Take 1 capsule by mouth daily.   Marland Kitchen Zoster Vaccine Adjuvanted (SHINGRIX) injection Shingrix (PF) 50 mcg/0.5 mL intramuscular suspension, kit  PHARMACIST ADMINISTERED IMMUNIZATION ADMINISTERED AT TIME OF DISPENSING  . [DISCONTINUED] methocarbamol (ROBAXIN) 500 MG tablet Take 1 tablet (500 mg total) by mouth every 8 (eight) hours as needed for muscle spasms.  . [DISCONTINUED] naproxen (NAPROSYN) 500 MG tablet Take 1 tablet (500 mg total) by mouth  2 (two) times daily with a meal.  . [DISCONTINUED] ondansetron (ZOFRAN) 4 MG tablet Take 1 tablet (4 mg total) by mouth every 8 (eight) hours as needed for nausea or vomiting.  . [DISCONTINUED] oxyCODONE-acetaminophen (PERCOCET) 5-325 MG tablet Take 1 tablet by mouth every 4 (four) hours as needed (max 6 q).   No facility-administered encounter medications on file as of  03/18/2020.    Allergies (verified) Penicillins   History: Past Medical History:  Diagnosis Date  . Arthritis    "left hip; right wrist" (06/12/2018)  . Depression   . Family history of polyps in the colon   . GERD (gastroesophageal reflux disease)   . Glaucoma   . Hypertension   . Type 2 diabetes, diet controlled ([place])    Past Surgical History:  Procedure Laterality Date  . BUNIONECTOMY Right   . CATARACT EXTRACTION W/ INTRAOCULAR LENS IMPLANT Right   . JOINT REPLACEMENT    . REPLACEMENT TOTAL KNEE Right 2015  . REVERSE SHOULDER ARTHROPLASTY Left 06/12/2018  . REVERSE SHOULDER ARTHROPLASTY Left 06/12/2018   Procedure: LEFT REVERSE SHOULDER ARTHROPLASTY;  Surgeon: Justice Britain, MD;  Location: [facility];  Service: Orthopedics;  Laterality: Left;  . SHOULDER ARTHROSCOPY W/ ROTATOR CUFF REPAIR Right 2015  . TUBAL LIGATION     History reviewed. No pertinent family history. Social History   Socioeconomic History  . Marital status: Married    Spouse name: Not on file  . Number of children: Not on file  . Years of education: Not on file  . Highest education level: Not on file  Occupational History  . Not on file  Tobacco Use  . Smoking status: Never Smoker  . Smokeless tobacco: Never Used  Vaping Use  . Vaping Use: Never used  Substance and Sexual Activity  . Alcohol use: Yes    Alcohol/week: 5.0 standard drinks    Types: 5 Glasses of wine per week  . Drug use: Never    Comment: 06/12/2018 CBD oil for pain  . Sexual activity: Yes  Other Topics Concern  . Not on file  Social History Narrative  . Not on file   Social Determinants of Health   Financial Resource Strain:   . Difficulty of Paying Living Expenses:   Food Insecurity:   . Worried About Charity fundraiser in the Last Year:   . Arboriculturist in the Last Year:   Transportation Needs:   . Film/video editor (Medical):   Marland Kitchen Lack of Transportation (Non-Medical):   Physical Activity:   . Days of Exercise  per Week:   . Minutes of Exercise per Session:   Stress:   . Feeling of Stress :   Social Connections:   . Frequency of Communication with Friends and Family:   . Frequency of Social Gatherings with Friends and Family:   . Attends Religious Services:   . Active Member of Clubs or Organizations:   . Attends Archivist Meetings:   Marland Kitchen Marital Status:     Tobacco Counseling Counseling given: Not Answered  Activities of Daily Living In your present state of health, do you have any difficulty performing the following activities: 03/18/2020  Hearing? N  Vision? N  Difficulty concentrating or making decisions? N  Walking or climbing stairs? N  Dressing or bathing? N  Doing errands, shopping? N  Some recent data might be hidden    Patient Care Team: Billie Ruddy, MD as PCP - General (Family Medicine)  Greig Castilla, MD Ophthalmology Paralee Cancel, MD  Orthopedics Marshall Cork, MD [place] any recent Medical Services you may have received from other than [facility] providers in the past year (date may be approximate).     Assessment:   This is a routine wellness examination for Jaeli.  Hearing/Vision screen [facility] , Dr. Kathlen Mody 12/30/19  Dietary issues and exercise activities discussed:    Goals   None    Depression Screen PHQ 2/9 Scores 05/02/2018  PHQ - 2 Score 1  PHQ- 9 Score 5    Fall Risk No flowsheet data found.  Any stairs in or around the home? No  If so, are there any without handrails? No  Home free of loose throw rugs in walkways, pet beds, electrical cords, etc? Yes  Adequate lighting in your home to reduce risk of falls? Yes   ASSISTIVE DEVICES UTILIZED TO PREVENT FALLS:  Life alert? n/a Use of a cane, walker or w/c? No  Grab bars in the bathroom? No  Shower chair or bench in shower? No  Elevated toilet seat or a handicapped toilet? No   Gait steady and fast without use of assistive device  Cognitive Function:   A&Ox 3.  Denies issues with memory, paying bills, or forgetting how to get places.      Immunizations Immunization History  Administered Date(s) Administered  . Influenza, High Dose Seasonal PF 05/12/2019  . Influenza-Unspecified 05/29/2018, 05/12/2019  . PFIZER SARS-COV-2 Vaccination 10/01/2019, 10/22/2019  . Pneumococcal Polysaccharide-23 06/13/2018  . Zoster Recombinat (Shingrix) 03/10/2019, 05/12/2019    TDAP status: Due, Education has been provided regarding the importance of this vaccine. Advised may receive this vaccine at local pharmacy or Health Dept. Aware to provide a copy of the vaccination record if obtained from local pharmacy or Health Dept. Verbalized acceptance and understanding. Covid vaccine completed--Pfizer 10/01/19 and 10/22/19  Screening Tests Health Maintenance  Topic Date Due  . Hepatitis C Screening  Never done  . FOOT EXAM  Never done  . TETANUS/TDAP  Never done  . MAMMOGRAM  Never done  . DEXA SCAN  Never done  . PNA vac Low Risk Adult (2 of 2 - PCV13) 06/14/2019  . HEMOGLOBIN A1C  12/16/2019  . INFLUENZA VACCINE  04/10/2020  . OPHTHALMOLOGY EXAM  11/16/2020  . COLONOSCOPY  11/08/2027  . COVID-19 Vaccine  Completed    Health Maintenance  Health Maintenance Due  Topic Date Due  . Hepatitis C Screening  Never done  . FOOT EXAM  Never done  . TETANUS/TDAP  Never done  . MAMMOGRAM  Never done  . DEXA SCAN  Never done  . PNA vac Low Risk Adult (2 of 2 - PCV13) 06/14/2019  . HEMOGLOBIN A1C  12/16/2019    Colorectal cancer screening: Completed 10/2017. Repeat every 10 years Mammogram to be scheduled this yr.  Lung Cancer Screening: (Low Dose CT Chest recommended if Age 61-80 years, 30 pack-year currently smoking OR have quit w/in 15years.) does not qualify as has never smoked.  Additional Screening:  Hepatitis C Screening: does qualify  Vision Screening: Recommended annual ophthalmology exams for early detection of glaucoma and other disorders  of the eye. Is the patient up to date with their annual eye exam?  Yes  Who is the provider or what is the name of the office in which the patient attends annual eye exams? [facility] clinic.  Also seen at [facility].  Dental Screening: Recommended annual dental exams for proper oral  hygiene    Plan:     I have personally reviewed and noted the following in the patient's chart:   . Medical and social history . Use of alcohol, tobacco or illicit drugs  . Current medications and supplements . Functional ability and status . Nutritional status . Physical activity . Advanced directives . List of other physicians . Hospitalizations, surgeries, and ER visits in previous 12 months . Vitals . Screenings to include cognitive, depression, and falls . Referrals and appointments  In addition, I have reviewed and discussed with patient certain preventive protocols, quality metrics, and best practice recommendations. A written personalized care plan for preventive services as well as general preventive health recommendations were provided to patient.  Essential hypertension  -elevated this visit -discussed checking bp regularly -continued lifestyle modifications encourage - Plan: Basic metabolic panel  Controlled type 2 diabetes mellitus with complication, without long-term current use of insulin (HCC)  -diet controlled -last hgb A1C 6.0 on 06/17/2019 - Plan: Hemoglobin A1c, Lipid panel  Insomnia, unspecified type -Discussed sleep hygiene -given handout -f/u in 1 month - Plan: T4, Free, TSH  Weight gain -likely 2/2 increased caloric intake and inability to go to the gym 2/2 COVID 19 pandemic. -discussed lifestyle modifications  Anxiety  -GAD-7 score 10 -discussed counseling.  Pt open to this.  Given info for area [facility] providers -also discussed medication.  Pt wishes to start med. -f/u in 4-6 wks, sooner if needed. - Plan: PARoxetine (PAXIL) 20 MG tablet  Strabismus -s/p recession or  resection procedure 02/05/20 -continue f/u with Dr. Kathlen Mody and Dr. Leodis Liverpool at [facility]  Chronic pain of multiple joints  -discussed concerns for autoimmune d/o given h/o joint pain and edema.  -will obtain labs.  If needed will place referral to Rheumatology -consider imaging to determine degree/type of arthritis in joints - Plan: CBC with Differential/Platelet, C-reactive Protein, Rheumatoid Factor, Sedimentation Rate, Lupus Anticoagulant Eval w/Reflex  Depression, major, single episode, mild (HCC)  -PHQ-9 score 11 -discussed counseling.  Given [place] info.  Encouraged to schedule an appointment -also discussed medication.  Pt wishes to start med.   -f/u in 4-6 wks, sooner if needed - Plan: PARoxetine (PAXIL) 20 MG tablet, T4, Free, TSH  Psoriasis  - Plan: Clobetasol Propionate 0.05 % shampoo, Lupus Anticoagulant Eval w/Reflex    Billie Ruddy, MD   03/18/2020

## 2020-03-18 NOTE — Patient Instructions (Addendum)
Dentist: Dr. Enzo Bi [place]   [facility] Dr. Corliss Blacker Dr. Ellouise Newer Dr. Milford Cage  Counseling: [email or web address] [place]  [facility]  [facility]  [facility] [phone]   Living With Depression Everyone experiences occasional disappointment, sadness, and loss in their lives. When you are feeling down, blue, or sad for at least 2 weeks in a row, it may mean that you have depression. Depression can affect your thoughts and feelings, relationships, daily activities, and physical health. It is caused by changes in the way your brain functions. If you receive a diagnosis of depression, your health care provider will tell you which type of depression you have and what treatment options are available to you. If you are living with depression, there are ways to help you recover from it and also ways to prevent it from coming back. How to cope with lifestyle changes Coping with stress     Stress is your body's reaction to life changes and events, both good and bad. Stressful situations may include:  Getting married.  The death of a spouse.  Losing a job.  Retiring.  Having a baby. Stress can last just a few hours or it can be ongoing. Stress can play a major role in depression, so it is important to learn both how to cope with stress and how to think about it differently. Talk with your health care provider or a counselor if you would like to learn more about stress reduction. He or she may suggest some stress reduction techniques, such as:  Music therapy. This can include creating music or listening to music. Choose music that you enjoy and that inspires you.  Mindfulness-based meditation. This kind of meditation can be done while sitting or walking. It involves being aware of your normal breaths, rather than trying to control your  breathing.  Centering prayer. This is a kind of meditation that involves focusing on a spiritual word or phrase. Choose a word, phrase, or sacred image that is meaningful to you and that brings you peace.  Deep breathing. To do this, expand your stomach and inhale slowly through your nose. Hold your breath for 3-5 seconds, then exhale slowly, allowing your stomach muscles to relax.  Muscle relaxation. This involves intentionally tensing muscles then relaxing them. Choose a stress reduction technique that fits your lifestyle and personality. Stress reduction techniques take time and practice to develop. Set aside 5-15 minutes a day to do them. Therapists can offer training in these techniques. The training may be covered by some insurance plans. Other things you can do to manage stress include:  Keeping a stress diary. This can help you learn what triggers your stress and ways to control your response.  Understanding what your limits are and saying no to requests or events that lead to a schedule that is too full.  Thinking about how you respond to certain situations. You may not be able to control everything, but you can control how you react.  Adding humor to your life by watching funny films or TV shows.  Making time for activities that help you relax and not feeling guilty about spending your time this way.  Medicines Your health care provider may suggest certain medicines if he or she feels that they will help improve your condition. Avoid using alcohol and other substances that may prevent your medicines from working properly (may interact). It is also important to:  Talk with your pharmacist or health care provider about all the medicines that you take, their possible side effects, and what medicines are safe to take together.  Make it your goal to take part in all treatment decisions (shared decision-making). This includes giving input on the side effects of medicines. It is best if  shared decision-making with your health care provider is part of your total treatment plan. If your health care provider prescribes a medicine, you may not notice the full benefits of it for 4-8 weeks. Most people who are treated for depression need to be on medicine for at least 6-12 months after they feel better. If you are taking medicines as part of your treatment, do not stop taking medicines without first talking to your health care provider. You may need to have the medicine slowly decreased (tapered) over time to decrease the risk of harmful side effects. Relationships Your health care provider may suggest family therapy along with individual therapy and drug therapy. While there may not be family problems that are causing you to feel depressed, it is still important to make sure your family learns as much as they can about your mental health. Having your family's support can help make your treatment successful. How to recognize changes in your condition Everyone has a different response to treatment for depression. Recovery from major depression happens when you have not had signs of major depression for two months. This may mean that you will start to:  Have more interest in doing activities.  Feel less hopeless than you did 2 months ago.  Have more energy.  Overeat less often, or have better or improving appetite.  Have better concentration. Your health care provider will work with you to decide the next steps in your recovery. It is also important to recognize when your condition is getting worse. Watch for these signs:  Having fatigue or low energy.  Eating too much or too little.  Sleeping too much or too little.  Feeling restless, agitated, or hopeless.  Having trouble concentrating or making decisions.  Having unexplained physical complaints.  Feeling irritable, angry, or aggressive. Get help as soon as you or your family members notice these symptoms coming back. How  to get support and help from others How to talk with friends and family members about your condition  Talking to friends and family members about your condition can provide you with one way to get support and guidance. Reach out to trusted friends or family members, explain your symptoms to them, and let them know that you are working with a health care provider to treat your depression. Financial resources Not all insurance plans cover mental health care, so it is important to check with your insurance carrier. If paying for co-pays or counseling services is a problem, search for a local or county mental health care center. They may be able to offer public mental health care services at low or no cost when you are not able to see a private health care provider. If you are taking medicine for depression, you may be able to get the generic form, which may be less expensive. Some makers of prescription medicines also offer help to patients who cannot afford the medicines they need. Follow these instructions at home:   Get the right amount and quality of sleep.  Cut down on using caffeine, tobacco, alcohol, and other potentially harmful substances.  Try to exercise, such as walking or lifting small weights.  Take over-the-counter and  prescription medicines only as told by your health care provider.  Eat a healthy diet that includes plenty of vegetables, fruits, whole grains, low-fat dairy products, and lean protein. Do not eat a lot of foods that are high in solid fats, added sugars, or salt.  Keep all follow-up visits as told by your health care provider. This is important. Contact a health care provider if:  You stop taking your antidepressant medicines, and you have any of these symptoms: ? Nausea. ? Headache. ? Feeling lightheaded. ? Chills and body aches. ? Not being able to sleep (insomnia).  You or your friends and family think your depression is getting worse. Get help right away  if:  You have thoughts of hurting yourself or others. If you ever feel like you may hurt yourself or others, or have thoughts about taking your own life, get help right away. You can go to your nearest emergency department or call:  Your local emergency services (911 in the [place]  A suicide crisis helpline, such as the [facility] at 330 336 3868. This is open 24-hours a day. Summary  If you are living with depression, there are ways to help you recover from it and also ways to prevent it from coming back.  Work with your health care team to create a management plan that includes counseling, stress management techniques, and healthy lifestyle habits. This information is not intended to replace advice given to you by your health care provider. Make sure you discuss any questions you have with your health care provider. Document Revised: 12/19/2018 Document Reviewed: 07/30/2016 Elsevier Patient Education  [place].  Insomnia Insomnia is a sleep disorder that makes it difficult to fall asleep or stay asleep. Insomnia can cause fatigue, low energy, difficulty concentrating, mood swings, and poor performance at work or school. There are three different ways to classify insomnia:  Difficulty falling asleep.  Difficulty staying asleep.  Waking up too early in the morning. Any type of insomnia can be long-term (chronic) or short-term (acute). Both are common. Short-term insomnia usually lasts for three months or less. Chronic insomnia occurs at least three times a week for longer than three months. What are the causes? Insomnia may be caused by another condition, situation, or substance, such as:  Anxiety.  Certain medicines.  Gastroesophageal reflux disease (GERD) or other gastrointestinal conditions.  Asthma or other breathing conditions.  Restless legs syndrome, sleep apnea, or other sleep disorders.  Chronic  pain.  Menopause.  Stroke.  Abuse of alcohol, tobacco, or illegal drugs.  Mental health conditions, such as depression.  Caffeine.  Neurological disorders, such as Alzheimer's disease.  An overactive thyroid (hyperthyroidism). Sometimes, the cause of insomnia may not be known. What increases the risk? Risk factors for insomnia include:  Gender. Women are affected more often than men.  Age. Insomnia is more common as you get older.  Stress.  Lack of exercise.  Irregular work schedule or working night shifts.  Traveling between different time zones.  Certain medical and mental health conditions. What are the signs or symptoms? If you have insomnia, the main symptom is having trouble falling asleep or having trouble staying asleep. This may lead to other symptoms, such as:  Feeling fatigued or having low energy.  Feeling nervous about going to sleep.  Not feeling rested in the morning.  Having trouble concentrating.  Feeling irritable, anxious, or depressed. How is this diagnosed? This condition may be diagnosed based on:  Your symptoms and medical  history. Your health care provider may ask about: ? Your sleep habits. ? Any medical conditions you have. ? Your mental health.  A physical exam. How is this treated? Treatment for insomnia depends on the cause. Treatment may focus on treating an underlying condition that is causing insomnia. Treatment may also include:  Medicines to help you sleep.  Counseling or therapy.  Lifestyle adjustments to help you sleep better. Follow these instructions at home: Eating and drinking   Limit or avoid alcohol, caffeinated beverages, and cigarettes, especially close to bedtime. These can disrupt your sleep.  Do not eat a large meal or eat spicy foods right before bedtime. This can lead to digestive discomfort that can make it hard for you to sleep. Sleep habits   Keep a sleep diary to help you and your health care  provider figure out what could be causing your insomnia. Write down: ? When you sleep. ? When you wake up during the night. ? How well you sleep. ? How rested you feel the next day. ? Any side effects of medicines you are taking. ? What you eat and drink.  Make your bedroom a dark, comfortable place where it is easy to fall asleep. ? Put up shades or blackout curtains to block light from outside. ? Use a white noise machine to block noise. ? Keep the temperature cool.  Limit screen use before bedtime. This includes: ? Watching TV. ? Using your smartphone, tablet, or computer.  Stick to a routine that includes going to bed and waking up at the same times every day and night. This can help you fall asleep faster. Consider making a quiet activity, such as reading, part of your nighttime routine.  Try to avoid taking naps during the day so that you sleep better at night.  Get out of bed if you are still awake after 15 minutes of trying to sleep. Keep the lights down, but try reading or doing a quiet activity. When you feel sleepy, go back to bed. General instructions  Take over-the-counter and prescription medicines only as told by your health care provider.  Exercise regularly, as told by your health care provider. Avoid exercise starting several hours before bedtime.  Use relaxation techniques to manage stress. Ask your health care provider to suggest some techniques that may work well for you. These may include: ? Breathing exercises. ? Routines to release muscle tension. ? Visualizing peaceful scenes.  Make sure that you drive carefully. Avoid driving if you feel very sleepy.  Keep all follow-up visits as told by your health care provider. This is important. Contact a health care provider if:  You are tired throughout the day.  You have trouble in your daily routine due to sleepiness.  You continue to have sleep problems, or your sleep problems get worse. Get help right  away if:  You have serious thoughts about hurting yourself or someone else. If you ever feel like you may hurt yourself or others, or have thoughts about taking your own life, get help right away. You can go to your nearest emergency department or call:  Your local emergency services (911 in the [place]  A suicide crisis helpline, such as the [facility] at (332)694-5710. This is open 24 hours a day. Summary  Insomnia is a sleep disorder that makes it difficult to fall asleep or stay asleep.  Insomnia can be long-term (chronic) or short-term (acute).  Treatment for insomnia depends on the cause. Treatment may  focus on treating an underlying condition that is causing insomnia.  Keep a sleep diary to help you and your health care provider figure out what could be causing your insomnia. This information is not intended to replace advice given to you by your health care provider. Make sure you discuss any questions you have with your health care provider. Document Revised: 08/09/2017 Document Reviewed: 06/06/2017 Elsevier Patient Education  [place], Adult After being diagnosed with an anxiety disorder, you may be relieved to know why you have felt or behaved a certain way. You may also feel overwhelmed about the treatment ahead and what it will mean for your life. With care and support, you can manage this condition and recover from it. How to manage lifestyle changes Managing stress and anxiety  Stress is your body's reaction to life changes and events, both good and bad. Most stress will last just a few hours, but stress can be ongoing and can lead to more than just stress. Although stress can play a major role in anxiety, it is not the same as anxiety. Stress is usually caused by something external, such as a deadline, test, or competition. Stress normally passes after the triggering event has ended.  Anxiety is caused by something  internal, such as imagining a terrible outcome or worrying that something will go wrong that will devastate you. Anxiety often does not go away even after the triggering event is over, and it can become long-term (chronic) worry. It is important to understand the differences between stress and anxiety and to manage your stress effectively so that it does not lead to an anxious response. Talk with your health care provider or a counselor to learn more about reducing anxiety and stress. He or she may suggest tension reduction techniques, such as:  Music therapy. This can include creating or listening to music that you enjoy and that inspires you.  Mindfulness-based meditation. This involves being aware of your normal breaths while not trying to control your breathing. It can be done while sitting or walking.  Centering prayer. This involves focusing on a word, phrase, or sacred image that means something to you and brings you peace.  Deep breathing. To do this, expand your stomach and inhale slowly through your nose. Hold your breath for 3-5 seconds. Then exhale slowly, letting your stomach muscles relax.  Self-talk. This involves identifying thought patterns that lead to anxiety reactions and changing those patterns.  Muscle relaxation. This involves tensing muscles and then relaxing them. Choose a tension reduction technique that suits your lifestyle and personality. These techniques take time and practice. Set aside 5-15 minutes a day to do them. Therapists can offer counseling and training in these techniques. The training to help with anxiety may be covered by some insurance plans. Other things you can do to manage stress and anxiety include:  Keeping a stress/anxiety diary. This can help you learn what triggers your reaction and then learn ways to manage your response.  Thinking about how you react to certain situations. You may not be able to control everything, but you can control your  response.  Making time for activities that help you relax and not feeling guilty about spending your time in this way.  Visual imagery and yoga can help you stay calm and relax.  Medicines Medicines can help ease symptoms. Medicines for anxiety include:  Anti-anxiety drugs.  Antidepressants. Medicines are often used as a primary treatment for anxiety disorder. Medicines will  be prescribed by a health care provider. When used together, medicines, psychotherapy, and tension reduction techniques may be the most effective treatment. Relationships Relationships can play a big part in helping you recover. Try to spend more time connecting with trusted friends and family members. Consider going to couples counseling, taking family education classes, or going to family therapy. Therapy can help you and others better understand your condition. How to recognize changes in your anxiety Everyone responds differently to treatment for anxiety. Recovery from anxiety happens when symptoms decrease and stop interfering with your daily activities at home or work. This may mean that you will start to:  Have better concentration and focus. Worry will interfere less in your daily thinking.  Sleep better.  Be less irritable.  Have more energy.  Have improved memory. It is important to recognize when your condition is getting worse. Contact your health care provider if your symptoms interfere with home or work and you feel like your condition is not improving. Follow these instructions at home: Activity  Exercise. Most adults should do the following: ? Exercise for at least 150 minutes each week. The exercise should increase your heart rate and make you sweat (moderate-intensity exercise). ? Strengthening exercises at least twice a week.  Get the right amount and quality of sleep. Most adults need 7-9 hours of sleep each night. Lifestyle   Eat a healthy diet that includes plenty of vegetables,  fruits, whole grains, low-fat dairy products, and lean protein. Do not eat a lot of foods that are high in solid fats, added sugars, or salt.  Make choices that simplify your life.  Do not use any products that contain nicotine or tobacco, such as cigarettes, e-cigarettes, and chewing tobacco. If you need help quitting, ask your health care provider.  Avoid caffeine, alcohol, and certain over-the-counter cold medicines. These may make you feel worse. Ask your pharmacist which medicines to avoid. General instructions  Take over-the-counter and prescription medicines only as told by your health care provider.  Keep all follow-up visits as told by your health care provider. This is important. Where to find support You can get help and support from these sources:  Self-help groups.  Online and [employer].  A trusted spiritual leader.  Couples counseling.  Family education classes.  Family therapy. Where to find more information You may find that joining a support group helps you deal with your anxiety. The following sources can help you locate counselors or support groups near you:  [place]: www.mentalhealthamerica.net  Anxiety and Depression Association of [place] (ADAA): https://www.[place]ark.net/  National Alliance on Mental Illness (NAMI): www.nami.org Contact a health care provider if you:  Have a hard time staying focused or finishing daily tasks.  Spend many hours a day feeling worried about everyday life.  Become exhausted by worry.  Start to have headaches, feel tense, or have nausea.  Urinate more than normal.  Have diarrhea. Get help right away if you have:  A racing heart and shortness of breath.  Thoughts of hurting yourself or others. If you ever feel like you may hurt yourself or others, or have thoughts about taking your own life, get help right away. You can go to your nearest emergency department or call:  Your local emergency services  (911 in the [place]  A suicide crisis helpline, such as the [facility] at (602)255-6081. This is open 24 hours a day. Summary  Taking steps to learn and use tension reduction techniques can  help calm you and help prevent triggering an anxiety reaction.  When used together, medicines, psychotherapy, and tension reduction techniques may be the most effective treatment.  Family, friends, and partners can play a big part in helping you recover from an anxiety disorder. This information is not intended to replace advice given to you by your health care provider. Make sure you discuss any questions you have with your health care provider. Document Revised: 01/27/2019 Document Reviewed: 01/27/2019 Elsevier Patient Education  [place].

## 2020-03-19 LAB — HEMOGLOBIN A1C
Hgb A1c MFr Bld: 5.6 % of total Hgb (ref <5.7)
Mean Plasma Glucose: 114 (calc)
eAG (mmol/L): 6.3 (calc)

## 2020-03-19 LAB — SEDIMENTATION RATE: Sed Rate: 36 mm/h — ABNORMAL HIGH (ref 0–30)

## 2020-03-19 LAB — BASIC METABOLIC PANEL
BUN: 16 mg/dL (ref 7–25)
CO2: 30 mmol/L (ref 20–32)
Calcium: 9.6 mg/dL (ref 8.6–10.4)
Chloride: 102 mmol/L (ref 98–110)
Creat: 0.76 mg/dL (ref 0.60–0.93)
Glucose, Bld: 104 mg/dL — ABNORMAL HIGH (ref 65–99)
Potassium: 3.9 mmol/L (ref 3.5–5.3)
Sodium: 141 mmol/L (ref 135–146)

## 2020-03-19 LAB — LIPID PANEL
Cholesterol: 135 mg/dL (ref <200)
HDL: 44 mg/dL — ABNORMAL LOW (ref >=50)
LDL Cholesterol (Calc): 76 mg/dL (calc)
Non-HDL Cholesterol (Calc): 91 mg/dL (calc) (ref <130)
Total CHOL/HDL Ratio: 3.1 (calc) (ref <5.0)
Triglycerides: 74 mg/dL (ref <150)

## 2020-03-19 LAB — CBC WITH DIFFERENTIAL/PLATELET
Absolute Monocytes: 459 cells/uL (ref 200–950)
Basophils Absolute: 32 cells/uL (ref 0–200)
Basophils Relative: 0.6 %
Eosinophils Absolute: 70 cells/uL (ref 15–500)
Eosinophils Relative: 1.3 %
HCT: 39.8 % (ref 35.0–45.0)
Hemoglobin: 13 g/dL (ref 11.7–15.5)
Lymphs Abs: 2025 cells/uL (ref 850–3900)
MCH: 27.7 pg (ref 27.0–33.0)
MCHC: 32.7 g/dL (ref 32.0–36.0)
MCV: 84.7 fL (ref 80.0–100.0)
MPV: 9.1 fL (ref 7.5–12.5)
Monocytes Relative: 8.5 %
Neutro Abs: 2813 cells/uL (ref 1500–7800)
Neutrophils Relative %: 52.1 %
Platelets: 294 10*3/uL (ref 140–400)
RBC: 4.7 10*6/uL (ref 3.80–5.10)
RDW: 13.4 % (ref 11.0–15.0)
Total Lymphocyte: 37.5 %
WBC: 5.4 10*3/uL (ref 3.8–10.8)

## 2020-03-19 LAB — RHEUMATOID FACTOR: Rheumatoid fact SerPl-aCnc: <14 IU/mL (ref <14)

## 2020-03-19 LAB — T4, FREE: Free T4: 1.3 ng/dL (ref 0.8–1.8)

## 2020-03-19 LAB — TSH: TSH: 0.8 mIU/L (ref 0.40–4.50)

## 2020-03-19 LAB — C-REACTIVE PROTEIN: CRP: 28.7 mg/L — ABNORMAL HIGH (ref <8.0)

## 2020-03-21 ENCOUNTER — Other Ambulatory Visit: Payer: Medicare Other

## 2020-03-22 LAB — LUPUS ANTICOAGULANT EVAL W/ REFLEX
PTT-LA Screen: 35 s (ref <=40)
dRVVT: 33 s (ref <=45)

## 2020-03-26 ENCOUNTER — Encounter: Payer: Self-pay | Admitting: Family Medicine

## 2020-03-26 DIAGNOSIS — F419 Anxiety disorder, unspecified: Secondary | ICD-10-CM | POA: Insufficient documentation

## 2020-03-26 DIAGNOSIS — M2550 Pain in unspecified joint: Secondary | ICD-10-CM | POA: Insufficient documentation

## 2020-03-26 DIAGNOSIS — I10 Essential (primary) hypertension: Secondary | ICD-10-CM | POA: Insufficient documentation

## 2020-03-26 DIAGNOSIS — G8929 Other chronic pain: Secondary | ICD-10-CM | POA: Insufficient documentation

## 2020-03-26 DIAGNOSIS — F320 Major depressive disorder, single episode, mild: Secondary | ICD-10-CM | POA: Insufficient documentation

## 2020-03-26 DIAGNOSIS — H509 Unspecified strabismus: Secondary | ICD-10-CM | POA: Insufficient documentation

## 2020-04-01 DIAGNOSIS — Z96612 Presence of left artificial shoulder joint: Secondary | ICD-10-CM | POA: Diagnosis not present

## 2020-04-01 DIAGNOSIS — Z471 Aftercare following joint replacement surgery: Secondary | ICD-10-CM | POA: Diagnosis not present

## 2020-04-04 DIAGNOSIS — H532 Diplopia: Secondary | ICD-10-CM | POA: Diagnosis not present

## 2020-04-04 DIAGNOSIS — H401132 Primary open-angle glaucoma, bilateral, moderate stage: Secondary | ICD-10-CM | POA: Diagnosis not present

## 2020-04-18 ENCOUNTER — Ambulatory Visit: Payer: Medicare Other | Admitting: Family Medicine

## 2020-04-20 ENCOUNTER — Encounter: Payer: Self-pay | Admitting: Family Medicine

## 2020-04-20 ENCOUNTER — Other Ambulatory Visit: Payer: Self-pay

## 2020-04-20 ENCOUNTER — Ambulatory Visit (INDEPENDENT_AMBULATORY_CARE_PROVIDER_SITE_OTHER): Payer: Medicare Other | Admitting: Family Medicine

## 2020-04-20 VITALS — BP 138/78 | HR 94 | Temp 98.1°F | Wt 205.0 lb

## 2020-04-20 DIAGNOSIS — F419 Anxiety disorder, unspecified: Secondary | ICD-10-CM

## 2020-04-20 DIAGNOSIS — L409 Psoriasis, unspecified: Secondary | ICD-10-CM | POA: Diagnosis not present

## 2020-04-20 NOTE — Patient Instructions (Signed)

## 2020-04-20 NOTE — Progress Notes (Signed)
Subjective:    Patient ID: Suzanne Conner, female    DOB: [birth year], 74 y.o.   MRN: [id number]  No chief complaint on file.   HPI Patient was seen today for f/u.  Pt stated Paxil 20 mg.  Taking med at night.  Initially, noted drowsiness, was able to get a good night sleep.  Over the last 2 days sleep not as restful.  Pt making time for herself which is helped.  Has not yet looked into counseling.  Pt notes areas of erythema and dryness on face and scalp.  Pt endorses increased flakes in scalp.  Areas on face flake at times. Tried clobetasol shampoo but it is overly drying.  Also tried Westcort 0.2% cream on face with little to no improvement.  Has areas starting above both eyebrows, on sides of nose, and left elbow.  Past Medical History:  Diagnosis Date  . Arthritis    "left hip; right wrist" (06/12/2018)  . Depression   . Family history of polyps in the colon   . GERD (gastroesophageal reflux disease)   . Glaucoma   . Hypertension   . Type 2 diabetes, diet controlled (HCC)     Allergies  Allergen Reactions  . Penicillins Hives and Other (See Comments)    PATIENT HAS HAD A PCN REACTION WITH IMMEDIATE RASH, FACIAL/TONGUE/THROAT SWELLING, SOB, OR LIGHTHEADEDNESS WITH HYPOTENSION:  #  #  YES  #  #  INCLUDING HIVES AS A CHILD Has patient had a PCN reaction causing severe rash involving mucus membranes or skin necrosis: No Has patient had a PCN reaction that required hospitalization: No Has patient had a PCN reaction occurring within the last 10 years: No If all of the above answers are "NO", then may proceed with Cephalosporin use. PATIENT HAS HAD A PCN REACTION WITH IMMEDIATE RASH, FACIAL/TONGUE/THROAT SWELLING, SOB, OR LIGHTHEADEDNESS WITH HYPOTENSION: # # YES # # INCLUDING HIVES AS A CHILD Has patient had a PCN reaction causing severe rash involving mucus membranes or skin necrosis: No Has patient had a PCN reaction that required hospitalization: No Has patient had a PCN  reaction occurring within the last 10 years: No If all of the above answers are "NO", then may proceed with Cephalosporin use.     ROS General: Denies fever, chills, night sweats, changes in weight, changes in appetite HEENT: Denies headaches, ear pain, changes in vision, rhinorrhea, sore throat CV: Denies CP, palpitations, SOB, orthopnea Pulm: Denies SOB, cough, wheezing GI: Denies abdominal pain, nausea, vomiting, diarrhea, constipation GU: Denies dysuria, hematuria, frequency, vaginal discharge Msk: Denies muscle cramps, joint pains Neuro: Denies weakness, numbness, tingling Skin: Denies rashes, bruising  +rash Psych: Denies depression, anxiety, hallucinations  +anxiety and depression.     Objective:    Blood pressure 138/78, pulse 94, temperature 98.1 F (36.7 C), temperature source Oral, weight 205 lb (93 kg), SpO2 96 %.  Gen. Pleasant, well-nourished, in no distress, normal affect   HEENT: [place]/AT, face symmetric, no scleral icterus, PERRLA, EOMI, nares patent without drainage Lungs: no accessory muscle use Cardiovascular: RRR, no peripheral edema Musculoskeletal: No deformities, no cyanosis or clubbing, normal tone Neuro:  A&Ox3, CN II-XII intact, normal gait Skin:  Warm, dry, intact.  Oval, smooth, greyish plaques without scale in scalp.  Small flesh-colored mildly hyperpigmented areas of skin above bilateral eyebrows.  Areas of erythema of bilateral lateral nares.    Wt Readings from Last 3 Encounters:  04/20/20 205 lb (93 kg)  03/18/20 203 lb 2 oz (92.1  kg)  06/12/18 189 lb (85.7 kg)    Lab Results  Component Value Date   WBC 5.4 03/18/2020   HGB 13.0 03/18/2020   HCT 39.8 03/18/2020   PLT 294 03/18/2020   GLUCOSE 104 (H) 03/18/2020   CHOL 135 03/18/2020   TRIG 74 03/18/2020   HDL 44 (L) 03/18/2020   LDLCALC 76 03/18/2020   NA 141 03/18/2020   K 3.9 03/18/2020   CL 102 03/18/2020   CREATININE 0.76 03/18/2020   BUN 16 03/18/2020   CO2 30 03/18/2020   TSH  0.80 03/18/2020   HGBA1C 5.6 03/18/2020    Assessment/Plan:  Anxiety -PHQ-9 score 5 was 11 on 03/18/2020 -GAD-7 score 4 was a 10 on 03/18/2020 -Continue Paxil 20 mg daily -Patient encouraged to reconsider counseling -Given handout -We will reevaluate in the next few weeks  Psoriasis  -Discussed supportive care: Coconut oil and other emollients -Pt to stop using hydrocortisone valerate 0.2% on face -Consider keeping a food diary to see if there is a correlation between food and severity of pruritic plaques. -as clobetasol is drying to scalp patient advised to hold off on use for now. -Per review of results from last OFV in 03/18/20 letter incorrectly switched from a Lupus panel to a lupus anticoagulant panel.  Pt made aware.  Patient to speak with front desk manager in regards to having iron removed from bill. -We will reorder lupus panel - Plan: LUPUS(12) PANEL, Sedimentation Rate, C-reactive Protein, Ambulatory referral to Dermatology, C-reactive Protein, Sedimentation Rate, LUPUS(12) PANEL  F/u in 4-6 wks, sooner if needed  Grier Mitts, MD

## 2020-04-23 ENCOUNTER — Encounter: Payer: Self-pay | Admitting: Family Medicine

## 2020-04-23 LAB — LUPUS(12) PANEL
Anti Nuclear Antibody (ANA): POSITIVE — AB
C3 Complement: 162 mg/dL (ref 83–193)
C4 Complement: 37 mg/dL (ref 15–57)
ENA SM Ab Ser-aCnc: <1 AI
Rheumatoid fact SerPl-aCnc: <14 IU/mL (ref <14)
Ribosomal P Protein Ab: <1 AI
SM/RNP: <1 AI
SSA (Ro) (ENA) Antibody, IgG: <1 AI
SSB (La) (ENA) Antibody, IgG: <1 AI
Scleroderma (Scl-70) (ENA) Antibody, IgG: <1 AI
Thyroperoxidase Ab SerPl-aCnc: 1 IU/mL (ref <9)
ds DNA Ab: <1 IU/mL

## 2020-04-23 LAB — SEDIMENTATION RATE: Sed Rate: 29 mm/h (ref 0–30)

## 2020-04-23 LAB — ANTI-NUCLEAR AB-TITER (ANA TITER)
ANA TITER: 1:80 {titer} — ABNORMAL HIGH
ANA Titer 1: 1:80 {titer} — ABNORMAL HIGH

## 2020-04-23 LAB — C-REACTIVE PROTEIN: CRP: 19.7 mg/L — ABNORMAL HIGH (ref <8.0)

## 2020-04-25 NOTE — Telephone Encounter (Signed)
Pt requests for a MyChart video visit to discuss Lab results, pt is aware that Dr Volanda Napoleon has not reviewed lab results, Pt is scheduled for a video visit on 04/28/2020

## 2020-04-26 ENCOUNTER — Other Ambulatory Visit: Payer: Self-pay | Admitting: Family Medicine

## 2020-04-26 DIAGNOSIS — R768 Other specified abnormal immunological findings in serum: Secondary | ICD-10-CM

## 2020-04-28 ENCOUNTER — Encounter: Payer: Self-pay | Admitting: Family Medicine

## 2020-04-28 ENCOUNTER — Telehealth (INDEPENDENT_AMBULATORY_CARE_PROVIDER_SITE_OTHER): Payer: Medicare Other | Admitting: Family Medicine

## 2020-04-28 ENCOUNTER — Other Ambulatory Visit: Payer: Self-pay

## 2020-04-28 DIAGNOSIS — I10 Essential (primary) hypertension: Secondary | ICD-10-CM

## 2020-04-28 DIAGNOSIS — Z712 Person consulting for explanation of examination or test findings: Secondary | ICD-10-CM

## 2020-04-28 DIAGNOSIS — F419 Anxiety disorder, unspecified: Secondary | ICD-10-CM

## 2020-04-28 NOTE — Progress Notes (Signed)
Virtual Visit via Telephone Note  I connected with Suzanne Conner on 04/28/20 at 11:00 AM EDT by telephone and verified that I am speaking with the correct person using two identifiers.   I discussed the limitations, risks, security and privacy concerns of performing an evaluation and management service by telephone and the availability of in person appointments. I also discussed with the patient that there may be a patient responsible charge related to this service. The patient expressed understanding and agreed to proceed.  Location patient: home Location provider: work or home office Participants present for the call: patient, provider Patient did not have a visit in the prior 7 days to address this/these issue(s).   History of Present Illness: Pt is a 74 yo female with pmh sig for HTN, DM II, GERD, arthritis, h/o depression, and anxiety.  Pt contacted for f/u and lab results.  Pt noticed a tremble and yawning and elevated bp on Paxil 20 mg.  Pt stopped taking the med on Sunday.  States tremors resolved and bp slightly improved.  Pt concerned as she has been on Micardis for yrs without issues.    Pt notes anxiety is up.  Pt concerned as she saw lab results on mychart and message about referral to Rheumatology.  Pt heard VM about calling clinic for labs, but when called back was advised the call was an appt reminder?.  Pt states she started thinking the worst.  ANA positive and CRP elevated.   Pt has an appt with Derm, but not for a few months which causes some anxiety.  Observations/Objective: Patient sounds cheerful and well on the phone. I do not appreciate any SOB. Speech and thought processing are grossly intact. Patient reported vitals:  BP 150/80  Assessment and Plan:  Encounter to discuss test results -autoimmune screening labs ordered 7/9 given h/o arthritis and skin lesions, however orders were inadvertently changed to the incorrect order (lupus panel ->lupus anticoag) -CRP  elevated and ANA positive. -discussed referral placed to rheumatology for further workup for autoimmune d/o. -questions answered to satisfaction  Essential hypertension -elevated -improving since d/c'ing Paxil -continue micardis 80-12.5 mg -continue lifestyle modifications -notify clinic if bp does not return to pt's baseline next wk.  Anxiety -d/c paxil 20 mg 2/2 causing tremor -discussed self care and other ways to reduce stress and anxiety. -pt to consider other medication options. -consider counseling   Follow Up Instructions: F/u scheduled for mid Sept., sooner if needed.  I did not refer this patient for an OV in the next 24 hours for this/these issue(s).  I discussed the assessment and treatment plan with the patient. The patient was provided an opportunity to ask questions and all were answered. The patient agreed with the plan and demonstrated an understanding of the instructions.   The patient was advised to call back or seek an in-person evaluation if the symptoms worsen or if the condition fails to improve as anticipated.  I provided 17 minutes of non-face-to-face time during this encounter.   Suzanne Ruddy, MD

## 2020-05-18 DIAGNOSIS — L218 Other seborrheic dermatitis: Secondary | ICD-10-CM | POA: Diagnosis not present

## 2020-05-19 DIAGNOSIS — M654 Radial styloid tenosynovitis [de Quervain]: Secondary | ICD-10-CM | POA: Diagnosis not present

## 2020-05-24 ENCOUNTER — Other Ambulatory Visit: Payer: Self-pay

## 2020-05-25 ENCOUNTER — Encounter: Payer: Self-pay | Admitting: Family Medicine

## 2020-05-25 ENCOUNTER — Ambulatory Visit (INDEPENDENT_AMBULATORY_CARE_PROVIDER_SITE_OTHER): Payer: Medicare Other | Admitting: Family Medicine

## 2020-05-25 VITALS — BP 136/70 | HR 86 | Temp 97.8°F | Wt 204.0 lb

## 2020-05-25 DIAGNOSIS — F329 Major depressive disorder, single episode, unspecified: Secondary | ICD-10-CM

## 2020-05-25 DIAGNOSIS — F419 Anxiety disorder, unspecified: Secondary | ICD-10-CM

## 2020-05-25 DIAGNOSIS — I10 Essential (primary) hypertension: Secondary | ICD-10-CM | POA: Diagnosis not present

## 2020-05-25 MED ORDER — SERTRALINE HCL 25 MG PO TABS: 25.0000 mg | ORAL_TABLET | Freq: Every day | ORAL | 3 refills | Status: DC

## 2020-05-25 NOTE — Progress Notes (Signed)
Subjective:    Patient ID: Suzanne Conner, female    DOB: 11-27-1945, 74 y.o.   MRN: [id number]  No chief complaint on file.   HPI Patient was seen today for f/u.  Pt states her anxiety is increasing.  Pt did find pleasure in doing things she used to do, overeating, has decreased focus.  Patient states she started getting stuck in her house as she used to go on trips and see friends.  Patient still adjusting to life in and see after moving from [place].  Pt previously on Paxil 20 mg, however it caused a tremor.  Patient tried counseling in the past and does not feel like it would be helpful at this point.  Pt also notes slight elevation in BP.  Denies any increased salt.  Not currently working out.  BP at home 130s-148/70-80s.  Patient denies headaches, chest pain, changes in vision.  Appointment with rheumatology scheduled for January 2022.  Patient was able to see dermatology.  Advised plaques on face and scalp for seborrheic dermatitis.  Patient using 2 different creams and notes improvement in skin.  Patient inquires about pneumonia vaccine.  Does not recall which vaccine she had in [place] several years ago.  Past Medical History:  Diagnosis Date  . Arthritis    "left hip; right wrist" (06/12/2018)  . Depression   . Family history of polyps in the colon   . GERD (gastroesophageal reflux disease)   . Glaucoma   . Hypertension   . Type 2 diabetes, diet controlled (HCC)     Allergies  Allergen Reactions  . Penicillins Hives and Other (See Comments)    PATIENT HAS HAD A PCN REACTION WITH IMMEDIATE RASH, FACIAL/TONGUE/THROAT SWELLING, SOB, OR LIGHTHEADEDNESS WITH HYPOTENSION:  #  #  YES  #  #  INCLUDING HIVES AS A CHILD Has patient had a PCN reaction causing severe rash involving mucus membranes or skin necrosis: No Has patient had a PCN reaction that required hospitalization: No Has patient had a PCN reaction occurring within the last 10 years: No If all of the above answers are  "NO", then may proceed with Cephalosporin use. PATIENT HAS HAD A PCN REACTION WITH IMMEDIATE RASH, FACIAL/TONGUE/THROAT SWELLING, SOB, OR LIGHTHEADEDNESS WITH HYPOTENSION: # # YES # # INCLUDING HIVES AS A CHILD Has patient had a PCN reaction causing severe rash involving mucus membranes or skin necrosis: No Has patient had a PCN reaction that required hospitalization: No Has patient had a PCN reaction occurring within the last 10 years: No If all of the above answers are "NO", then may proceed with Cephalosporin use.     ROS General: Denies fever, chills, night sweats + overeating, weight gain HEENT: Denies headaches, ear pain, changes in vision, rhinorrhea, sore throat CV: Denies CP, palpitations, SOB, orthopnea Pulm: Denies SOB, cough, wheezing GI: Denies abdominal pain, nausea, vomiting, diarrhea, constipation GU: Denies dysuria, hematuria, frequency, vaginal discharge Msk: Denies muscle cramps, joint pains Neuro: Denies weakness, numbness, tingling Skin: Denies rashes, bruising Psych: Denies hallucinations + anxiety, depression     Objective:    Blood pressure 136/70, pulse 86, temperature 97.8 F (36.6 C), temperature source Oral, weight 204 lb (92.5 kg), SpO2 98 %.  Gen. Pleasant, well-nourished, in no distress, normal affect   HEENT: [place]/AT, face symmetric, conjunctiva clear, no scleral icterus, PERRLA, EOMI, nares patent without drainage Lungs: no accessory muscle use Cardiovascular: RRR, no m/r/g, no peripheral edema Musculoskeletal: No deformities, no cyanosis or clubbing, normal tone Neuro:  A&Ox3, CN II-XII intact, normal gait Skin:  Warm, no lesions/ rash   Wt Readings from Last 3 Encounters:  05/25/20 204 lb (92.5 kg)  04/20/20 205 lb (93 kg)  03/18/20 203 lb 2 oz (92.1 kg)    Lab Results  Component Value Date   WBC 5.4 03/18/2020   HGB 13.0 03/18/2020   HCT 39.8 03/18/2020   PLT 294 03/18/2020   GLUCOSE 104 (H) 03/18/2020   CHOL 135 03/18/2020    TRIG 74 03/18/2020   HDL 44 (L) 03/18/2020   LDLCALC 76 03/18/2020   NA 141 03/18/2020   K 3.9 03/18/2020   CL 102 03/18/2020   CREATININE 0.76 03/18/2020   BUN 16 03/18/2020   CO2 30 03/18/2020   TSH 0.80 03/18/2020   HGBA1C 5.6 03/18/2020    Assessment/Plan:  Anxiety and depression  -PHQ 9 score 8 was 5 on 04/20/20 -GAD 7 score 5 was 4 on 04/20/20 -consider counseling - Plan: sertraline (ZOLOFT) 25 MG tablet  Essential hypertension -controlled -continue lifestyle modifications -continue micardis 80-12.5 mg  Pt to check with insurance company regarding PNA vaccine.   F/u in 1 month  Grier Mitts, MD

## 2020-05-25 NOTE — Patient Instructions (Signed)
Managing Your Hypertension Hypertension is commonly called high blood pressure. This is when the force of your blood pressing against the walls of your arteries is too strong. Arteries are blood vessels that carry blood from your heart throughout your body. Hypertension forces the heart to work harder to pump blood, and may cause the arteries to become narrow or stiff. Having untreated or uncontrolled hypertension can cause heart attack, stroke, kidney disease, and other problems. What are blood pressure readings? A blood pressure reading consists of a higher number over a lower number. Ideally, your blood pressure should be below 120/80. The first ("top") number is called the systolic pressure. It is a measure of the pressure in your arteries as your heart beats. The second ("bottom") number is called the diastolic pressure. It is a measure of the pressure in your arteries as the heart relaxes. What does my blood pressure reading mean? Blood pressure is classified into four stages. Based on your blood pressure reading, your health care provider may use the following stages to determine what type of treatment you need, if any. Systolic pressure and diastolic pressure are measured in a unit called mm Hg. Normal  Systolic pressure: below 120.  Diastolic pressure: below 80. Elevated  Systolic pressure: 120-129.  Diastolic pressure: below 80. Hypertension stage 1  Systolic pressure: 130-139.  Diastolic pressure: 80-89. Hypertension stage 2  Systolic pressure: 140 or above.  Diastolic pressure: 90 or above. What health risks are associated with hypertension? Managing your hypertension is an important responsibility. Uncontrolled hypertension can lead to:  A heart attack.  A stroke.  A weakened blood vessel (aneurysm).  Heart failure.  Kidney damage.  Eye damage.  Metabolic syndrome.  Memory and concentration problems. What changes can I make to manage my  hypertension? Hypertension can be managed by making lifestyle changes and possibly by taking medicines. Your health care provider will help you make a plan to bring your blood pressure within a normal range. Eating and drinking   Eat a diet that is high in fiber and potassium, and low in salt (sodium), added sugar, and fat. An example eating plan is called the DASH (Dietary Approaches to Stop Hypertension) diet. To eat this way: ? Eat plenty of fresh fruits and vegetables. Try to fill half of your plate at each meal with fruits and vegetables. ? Eat whole grains, such as whole wheat pasta, brown rice, or whole grain bread. Fill about one quarter of your plate with whole grains. ? Eat low-fat diary products. ? Avoid fatty cuts of meat, processed or cured meats, and poultry with skin. Fill about one quarter of your plate with lean proteins such as fish, chicken without skin, beans, eggs, and tofu. ? Avoid premade and processed foods. These tend to be higher in sodium, added sugar, and fat.  Reduce your daily sodium intake. Most people with hypertension should eat less than 1,500 mg of sodium a day.  Limit alcohol intake to no more than 1 drink a day for nonpregnant women and 2 drinks a day for men. One drink equals 12 oz of beer, 5 oz of wine, or 1 oz of hard liquor. Lifestyle  Work with your health care provider to maintain a healthy body weight, or to lose weight. Ask what an ideal weight is for you.  Get at least 30 minutes of exercise that causes your heart to beat faster (aerobic exercise) most days of the week. Activities may include walking, swimming, or biking.  Include exercise   to strengthen your muscles (resistance exercise), such as weight lifting, as part of your weekly exercise routine. Try to do these types of exercises for 30 minutes at least 3 days a week.  Do not use any products that contain nicotine or tobacco, such as cigarettes and e-cigarettes. If you need help quitting,  ask your health care provider.  Control any long-term (chronic) conditions you have, such as high cholesterol or diabetes. Monitoring  Monitor your blood pressure at home as told by your health care provider. Your personal target blood pressure may vary depending on your medical conditions, your age, and other factors.  Have your blood pressure checked regularly, as often as told by your health care provider. Working with your health care provider  Review all the medicines you take with your health care provider because there may be side effects or interactions.  Talk with your health care provider about your diet, exercise habits, and other lifestyle factors that may be contributing to hypertension.  Visit your health care provider regularly. Your health care provider can help you create and adjust your plan for managing hypertension. Will I need medicine to control my blood pressure? Your health care provider may prescribe medicine if lifestyle changes are not enough to get your blood pressure under control, and if:  Your systolic blood pressure is 130 or higher.  Your diastolic blood pressure is 80 or higher. Take medicines only as told by your health care provider. Follow the directions carefully. Blood pressure medicines must be taken as prescribed. The medicine does not work as well when you skip doses. Skipping doses also puts you at risk for problems. Contact a health care provider if:  You think you are having a reaction to medicines you have taken.  You have repeated (recurrent) headaches.  You feel dizzy.  You have swelling in your ankles.  You have trouble with your vision. Get help right away if:  You develop a severe headache or confusion.  You have unusual weakness or numbness, or you feel faint.  You have severe pain in your chest or abdomen.  You vomit repeatedly.  You have trouble breathing. Summary  Hypertension is when the force of blood pumping  through your arteries is too strong. If this condition is not controlled, it may put you at risk for serious complications.  Your personal target blood pressure may vary depending on your medical conditions, your age, and other factors. For most people, a normal blood pressure is less than 120/80.  Hypertension is managed by lifestyle changes, medicines, or both. Lifestyle changes include weight loss, eating a healthy, low-sodium diet, exercising more, and limiting alcohol. This information is not intended to replace advice given to you by your health care provider. Make sure you discuss any questions you have with your health care provider. Document Revised: 12/19/2018 Document Reviewed: 07/25/2016 Elsevier Patient Education  2020 [place] With Depression Everyone experiences occasional disappointment, sadness, and loss in their lives. When you are feeling down, blue, or sad for at least 2 weeks in a row, it may mean that you have depression. Depression can affect your thoughts and feelings, relationships, daily activities, and physical health. It is caused by changes in the way your brain functions. If you receive a diagnosis of depression, your health care provider will tell you which type of depression you have and what treatment options are available to you. If you are living with depression, there are ways to help you recover from  it and also ways to prevent it from coming back. How to cope with lifestyle changes Coping with stress     Stress is your body's reaction to life changes and events, both good and bad. Stressful situations may include:  Getting married.  The death of a spouse.  Losing a job.  Retiring.  Having a baby. Stress can last just a few hours or it can be ongoing. Stress can play a major role in depression, so it is important to learn both how to cope with stress and how to think about it differently. Talk with your health care provider or a  counselor if you would like to learn more about stress reduction. He or she may suggest some stress reduction techniques, such as:  Music therapy. This can include creating music or listening to music. Choose music that you enjoy and that inspires you.  Mindfulness-based meditation. This kind of meditation can be done while sitting or walking. It involves being aware of your normal breaths, rather than trying to control your breathing.  Centering prayer. This is a kind of meditation that involves focusing on a spiritual word or phrase. Choose a word, phrase, or sacred image that is meaningful to you and that brings you peace.  Deep breathing. To do this, expand your stomach and inhale slowly through your nose. Hold your breath for 3-5 seconds, then exhale slowly, allowing your stomach muscles to relax.  Muscle relaxation. This involves intentionally tensing muscles then relaxing them. Choose a stress reduction technique that fits your lifestyle and personality. Stress reduction techniques take time and practice to develop. Set aside 5-15 minutes a day to do them. Therapists can offer training in these techniques. The training may be covered by some insurance plans. Other things you can do to manage stress include:  Keeping a stress diary. This can help you learn what triggers your stress and ways to control your response.  Understanding what your limits are and saying no to requests or events that lead to a schedule that is too full.  Thinking about how you respond to certain situations. You may not be able to control everything, but you can control how you react.  Adding humor to your life by watching funny films or TV shows.  Making time for activities that help you relax and not feeling guilty about spending your time this way.  Medicines Your health care provider may suggest certain medicines if he or she feels that they will help improve your condition. Avoid using alcohol and other  substances that may prevent your medicines from working properly (may interact). It is also important to:  Talk with your pharmacist or health care provider about all the medicines that you take, their possible side effects, and what medicines are safe to take together.  Make it your goal to take part in all treatment decisions (shared decision-making). This includes giving input on the side effects of medicines. It is best if shared decision-making with your health care provider is part of your total treatment plan. If your health care provider prescribes a medicine, you may not notice the full benefits of it for 4-8 weeks. Most people who are treated for depression need to be on medicine for at least 6-12 months after they feel better. If you are taking medicines as part of your treatment, do not stop taking medicines without first talking to your health care provider. You may need to have the medicine slowly decreased (tapered) over time to decrease  the risk of harmful side effects. Relationships Your health care provider may suggest family therapy along with individual therapy and drug therapy. While there may not be family problems that are causing you to feel depressed, it is still important to make sure your family learns as much as they can about your mental health. Having your family's support can help make your treatment successful. How to recognize changes in your condition Everyone has a different response to treatment for depression. Recovery from major depression happens when you have not had signs of major depression for two months. This may mean that you will start to:  Have more interest in doing activities.  Feel less hopeless than you did 2 months ago.  Have more energy.  Overeat less often, or have better or improving appetite.  Have better concentration. Your health care provider will work with you to decide the next steps in your recovery. It is also important to recognize  when your condition is getting worse. Watch for these signs:  Having fatigue or low energy.  Eating too much or too little.  Sleeping too much or too little.  Feeling restless, agitated, or hopeless.  Having trouble concentrating or making decisions.  Having unexplained physical complaints.  Feeling irritable, angry, or aggressive. Get help as soon as you or your family members notice these symptoms coming back. How to get support and help from others How to talk with friends and family members about your condition  Talking to friends and family members about your condition can provide you with one way to get support and guidance. Reach out to trusted friends or family members, explain your symptoms to them, and let them know that you are working with a health care provider to treat your depression. Financial resources Not all insurance plans cover mental health care, so it is important to check with your insurance carrier. If paying for co-pays or counseling services is a problem, search for a local or county mental health care center. They may be able to offer public mental health care services at low or no cost when you are not able to see a private health care provider. If you are taking medicine for depression, you may be able to get the generic form, which may be less expensive. Some makers of prescription medicines also offer help to patients who cannot afford the medicines they need. Follow these instructions at home:   Get the right amount and quality of sleep.  Cut down on using caffeine, tobacco, alcohol, and other potentially harmful substances.  Try to exercise, such as walking or lifting small weights.  Take over-the-counter and prescription medicines only as told by your health care provider.  Eat a healthy diet that includes plenty of vegetables, fruits, whole grains, low-fat dairy products, and lean protein. Do not eat a lot of foods that are high in solid fats,  added sugars, or salt.  Keep all follow-up visits as told by your health care provider. This is important. Contact a health care provider if:  You stop taking your antidepressant medicines, and you have any of these symptoms: ? Nausea. ? Headache. ? Feeling lightheaded. ? Chills and body aches. ? Not being able to sleep (insomnia).  You or your friends and family think your depression is getting worse. Get help right away if:  You have thoughts of hurting yourself or others. If you ever feel like you may hurt yourself or others, or have thoughts about taking your own life, get  help right away. You can go to your nearest emergency department or call:  Your local emergency services (911 in the [place]  A suicide crisis helpline, such as the [place] [facility] at 908-713-5534. This is open 24-hours a day. Summary  If you are living with depression, there are ways to help you recover from it and also ways to prevent it from coming back.  Work with your health care team to create a management plan that includes counseling, stress management techniques, and healthy lifestyle habits. This information is not intended to replace advice given to you by your health care provider. Make sure you discuss any questions you have with your health care provider. Document Revised: 12/19/2018 Document Reviewed: 07/30/2016 Elsevier Patient Education  [place], Adult After being diagnosed with an anxiety disorder, you may be relieved to know why you have felt or behaved a certain way. You may also feel overwhelmed about the treatment ahead and what it will mean for your life. With care and support, you can manage this condition and recover from it. How to manage lifestyle changes Managing stress and anxiety  Stress is your body's reaction to life changes and events, both good and bad. Most stress will last just a few hours, but stress can be ongoing and  can lead to more than just stress. Although stress can play a major role in anxiety, it is not the same as anxiety. Stress is usually caused by something external, such as a deadline, test, or competition. Stress normally passes after the triggering event has ended.  Anxiety is caused by something internal, such as imagining a terrible outcome or worrying that something will go wrong that will devastate you. Anxiety often does not go away even after the triggering event is over, and it can become long-term (chronic) worry. It is important to understand the differences between stress and anxiety and to manage your stress effectively so that it does not lead to an anxious response. Talk with your health care provider or a counselor to learn more about reducing anxiety and stress. He or she may suggest tension reduction techniques, such as:  Music therapy. This can include creating or listening to music that you enjoy and that inspires you.  Mindfulness-based meditation. This involves being aware of your normal breaths while not trying to control your breathing. It can be done while sitting or walking.  Centering prayer. This involves focusing on a word, phrase, or sacred image that means something to you and brings you peace.  Deep breathing. To do this, expand your stomach and inhale slowly through your nose. Hold your breath for 3-5 seconds. Then exhale slowly, letting your stomach muscles relax.  Self-talk. This involves identifying thought patterns that lead to anxiety reactions and changing those patterns.  Muscle relaxation. This involves tensing muscles and then relaxing them. Choose a tension reduction technique that suits your lifestyle and personality. These techniques take time and practice. Set aside 5-15 minutes a day to do them. Therapists can offer counseling and training in these techniques. The training to help with anxiety may be covered by some insurance plans. Other things you can do  to manage stress and anxiety include:  Keeping a stress/anxiety diary. This can help you learn what triggers your reaction and then learn ways to manage your response.  Thinking about how you react to certain situations. You may not be able to control everything, but you can control your response.  Making  time for activities that help you relax and not feeling guilty about spending your time in this way.  Visual imagery and yoga can help you stay calm and relax.  Medicines Medicines can help ease symptoms. Medicines for anxiety include:  Anti-anxiety drugs.  Antidepressants. Medicines are often used as a primary treatment for anxiety disorder. Medicines will be prescribed by a health care provider. When used together, medicines, psychotherapy, and tension reduction techniques may be the most effective treatment. Relationships Relationships can play a big part in helping you recover. Try to spend more time connecting with trusted friends and family members. Consider going to couples counseling, taking family education classes, or going to family therapy. Therapy can help you and others better understand your condition. How to recognize changes in your anxiety Everyone responds differently to treatment for anxiety. Recovery from anxiety happens when symptoms decrease and stop interfering with your daily activities at home or work. This may mean that you will start to:  Have better concentration and focus. Worry will interfere less in your daily thinking.  Sleep better.  Be less irritable.  Have more energy.  Have improved memory. It is important to recognize when your condition is getting worse. Contact your health care provider if your symptoms interfere with home or work and you feel like your condition is not improving. Follow these instructions at home: Activity  Exercise. Most adults should do the following: ? Exercise for at least 150 minutes each week. The exercise should  increase your heart rate and make you sweat (moderate-intensity exercise). ? Strengthening exercises at least twice a week.  Get the right amount and quality of sleep. Most adults need 7-9 hours of sleep each night. Lifestyle   Eat a healthy diet that includes plenty of vegetables, fruits, whole grains, low-fat dairy products, and lean protein. Do not eat a lot of foods that are high in solid fats, added sugars, or salt.  Make choices that simplify your life.  Do not use any products that contain nicotine or tobacco, such as cigarettes, e-cigarettes, and chewing tobacco. If you need help quitting, ask your health care provider.  Avoid caffeine, alcohol, and certain over-the-counter cold medicines. These may make you feel worse. Ask your pharmacist which medicines to avoid. General instructions  Take over-the-counter and prescription medicines only as told by your health care provider.  Keep all follow-up visits as told by your health care provider. This is important. Where to find support You can get help and support from these sources:  Self-help groups.  Online and [employer].  A trusted spiritual leader.  Couples counseling.  Family education classes.  Family therapy. Where to find more information You may find that joining a support group helps you deal with your anxiety. The following sources can help you locate counselors or support groups near you:  [place]: www.mentalhealth[place].net  Anxiety and Depression Association of [place] (ADAA): https://www.clark.net/  National Alliance on Mental Illness (NAMI): www.nami.org Contact a health care provider if you:  Have a hard time staying focused or finishing daily tasks.  Spend many hours a day feeling worried about everyday life.  Become exhausted by worry.  Start to have headaches, feel tense, or have nausea.  Urinate more than normal.  Have diarrhea. Get help right away if you have:  A  racing heart and shortness of breath.  Thoughts of hurting yourself or others. If you ever feel like you may hurt yourself or others, or have thoughts about taking  your own life, get help right away. You can go to your nearest emergency department or call:  Your local emergency services (911 in the [place]  A suicide crisis helpline, such as the [facility] at 573-881-1751. This is open 24 hours a day. Summary  Taking steps to learn and use tension reduction techniques can help calm you and help prevent triggering an anxiety reaction.  When used together, medicines, psychotherapy, and tension reduction techniques may be the most effective treatment.  Family, friends, and partners can play a big part in helping you recover from an anxiety disorder. This information is not intended to replace advice given to you by your health care provider. Make sure you discuss any questions you have with your health care provider. Document Revised: 01/27/2019 Document Reviewed: 01/27/2019 Elsevier Patient Education  [place].

## 2020-06-10 DIAGNOSIS — Z23 Encounter for immunization: Secondary | ICD-10-CM | POA: Diagnosis not present

## 2020-06-24 ENCOUNTER — Ambulatory Visit: Payer: Medicare Other | Admitting: Family Medicine

## 2020-06-24 DIAGNOSIS — L820 Inflamed seborrheic keratosis: Secondary | ICD-10-CM | POA: Diagnosis not present

## 2020-06-24 DIAGNOSIS — L218 Other seborrheic dermatitis: Secondary | ICD-10-CM | POA: Diagnosis not present

## 2020-06-27 ENCOUNTER — Ambulatory Visit (INDEPENDENT_AMBULATORY_CARE_PROVIDER_SITE_OTHER): Payer: Medicare Other | Admitting: Family Medicine

## 2020-06-27 ENCOUNTER — Ambulatory Visit (INDEPENDENT_AMBULATORY_CARE_PROVIDER_SITE_OTHER): Payer: Medicare Other

## 2020-06-27 ENCOUNTER — Encounter: Payer: Self-pay | Admitting: Family Medicine

## 2020-06-27 ENCOUNTER — Other Ambulatory Visit: Payer: Self-pay

## 2020-06-27 VITALS — BP 150/80 | HR 101 | Temp 98.3°F | Wt 201.4 lb

## 2020-06-27 DIAGNOSIS — I10 Essential (primary) hypertension: Secondary | ICD-10-CM | POA: Diagnosis not present

## 2020-06-27 DIAGNOSIS — F419 Anxiety disorder, unspecified: Secondary | ICD-10-CM

## 2020-06-27 DIAGNOSIS — F32A Depression, unspecified: Secondary | ICD-10-CM

## 2020-06-27 DIAGNOSIS — J4520 Mild intermittent asthma, uncomplicated: Secondary | ICD-10-CM | POA: Diagnosis not present

## 2020-06-27 DIAGNOSIS — R0602 Shortness of breath: Secondary | ICD-10-CM

## 2020-06-27 DIAGNOSIS — R000 Tachycardia, unspecified: Secondary | ICD-10-CM | POA: Diagnosis not present

## 2020-06-27 MED ORDER — ALBUTEROL SULFATE HFA 108 (90 BASE) MCG/ACT IN AERS: 2.0000 | INHALATION_SPRAY | Freq: Four times a day (QID) | RESPIRATORY_TRACT | 2 refills | Status: DC | PRN

## 2020-06-27 MED ORDER — CARVEDILOL 3.125 MG PO TABS: 3.1250 mg | ORAL_TABLET | Freq: Two times a day (BID) | ORAL | 3 refills | Status: DC

## 2020-06-27 NOTE — Patient Instructions (Signed)
Managing Your Hypertension Hypertension is commonly called high blood pressure. This is when the force of your blood pressing against the walls of your arteries is too strong. Arteries are blood vessels that carry blood from your heart throughout your body. Hypertension forces the heart to work harder to pump blood, and may cause the arteries to become narrow or stiff. Having untreated or uncontrolled hypertension can cause heart attack, stroke, kidney disease, and other problems. What are blood pressure readings? A blood pressure reading consists of a higher number over a lower number. Ideally, your blood pressure should be below 120/80. The first ("top") number is called the systolic pressure. It is a measure of the pressure in your arteries as your heart beats. The second ("bottom") number is called the diastolic pressure. It is a measure of the pressure in your arteries as the heart relaxes. What does my blood pressure reading mean? Blood pressure is classified into four stages. Based on your blood pressure reading, your health care provider may use the following stages to determine what type of treatment you need, if any. Systolic pressure and diastolic pressure are measured in a unit called mm Hg. Normal  Systolic pressure: below 120.  Diastolic pressure: below 80. Elevated  Systolic pressure: 120-129.  Diastolic pressure: below 80. Hypertension stage 1  Systolic pressure: 130-139.  Diastolic pressure: 80-89. Hypertension stage 2  Systolic pressure: 140 or above.  Diastolic pressure: 90 or above. What health risks are associated with hypertension? Managing your hypertension is an important responsibility. Uncontrolled hypertension can lead to:  A heart attack.  A stroke.  A weakened blood vessel (aneurysm).  Heart failure.  Kidney damage.  Eye damage.  Metabolic syndrome.  Memory and concentration problems. What changes can I make to manage my  hypertension? Hypertension can be managed by making lifestyle changes and possibly by taking medicines. Your health care provider will help you make a plan to bring your blood pressure within a normal range. Eating and drinking   Eat a diet that is high in fiber and potassium, and low in salt (sodium), added sugar, and fat. An example eating plan is called the DASH (Dietary Approaches to Stop Hypertension) diet. To eat this way: ? Eat plenty of fresh fruits and vegetables. Try to fill half of your plate at each meal with fruits and vegetables. ? Eat whole grains, such as whole wheat pasta, brown rice, or whole grain bread. Fill about one quarter of your plate with whole grains. ? Eat low-fat diary products. ? Avoid fatty cuts of meat, processed or cured meats, and poultry with skin. Fill about one quarter of your plate with lean proteins such as fish, chicken without skin, beans, eggs, and tofu. ? Avoid premade and processed foods. These tend to be higher in sodium, added sugar, and fat.  Reduce your daily sodium intake. Most people with hypertension should eat less than 1,500 mg of sodium a day.  Limit alcohol intake to no more than 1 drink a day for nonpregnant women and 2 drinks a day for men. One drink equals 12 oz of beer, 5 oz of wine, or 1 oz of hard liquor. Lifestyle  Work with your health care provider to maintain a healthy body weight, or to lose weight. Ask what an ideal weight is for you.  Get at least 30 minutes of exercise that causes your heart to beat faster (aerobic exercise) most days of the week. Activities may include walking, swimming, or biking.  Include exercise   to strengthen your muscles (resistance exercise), such as weight lifting, as part of your weekly exercise routine. Try to do these types of exercises for 30 minutes at least 3 days a week.  Do not use any products that contain nicotine or tobacco, such as cigarettes and e-cigarettes. If you need help quitting,  ask your health care provider.  Control any long-term (chronic) conditions you have, such as high cholesterol or diabetes. Monitoring  Monitor your blood pressure at home as told by your health care provider. Your personal target blood pressure may vary depending on your medical conditions, your age, and other factors.  Have your blood pressure checked regularly, as often as told by your health care provider. Working with your health care provider  Review all the medicines you take with your health care provider because there may be side effects or interactions.  Talk with your health care provider about your diet, exercise habits, and other lifestyle factors that may be contributing to hypertension.  Visit your health care provider regularly. Your health care provider can help you create and adjust your plan for managing hypertension. Will I need medicine to control my blood pressure? Your health care provider may prescribe medicine if lifestyle changes are not enough to get your blood pressure under control, and if:  Your systolic blood pressure is 130 or higher.  Your diastolic blood pressure is 80 or higher. Take medicines only as told by your health care provider. Follow the directions carefully. Blood pressure medicines must be taken as prescribed. The medicine does not work as well when you skip doses. Skipping doses also puts you at risk for problems. Contact a health care provider if:  You think you are having a reaction to medicines you have taken.  You have repeated (recurrent) headaches.  You feel dizzy.  You have swelling in your ankles.  You have trouble with your vision. Get help right away if:  You develop a severe headache or confusion.  You have unusual weakness or numbness, or you feel faint.  You have severe pain in your chest or abdomen.  You vomit repeatedly.  You have trouble breathing. Summary  Hypertension is when the force of blood pumping  through your arteries is too strong. If this condition is not controlled, it may put you at risk for serious complications.  Your personal target blood pressure may vary depending on your medical conditions, your age, and other factors. For most people, a normal blood pressure is less than 120/80.  Hypertension is managed by lifestyle changes, medicines, or both. Lifestyle changes include weight loss, eating a healthy, low-sodium diet, exercising more, and limiting alcohol. This information is not intended to replace advice given to you by your health care provider. Make sure you discuss any questions you have with your health care provider. Document Revised: 12/19/2018 Document Reviewed: 07/25/2016 Elsevier Patient Education  [place]. Carvedilol Tablets What is this medicine? CARVEDILOL (KAR ve dil ol) is a beta blocker. It decreases the amount of work your heart has to do and helps your heart beat regularly. It treats high blood pressure. This medicine may be used for other purposes; ask your health care provider or pharmacist if you have questions. COMMON BRAND NAME(S): Coreg What should I tell my health care provider before I take this medicine? They need to know if you have any of these conditions:  circulation problems  diabetes  history of heart attack or heart disease  liver disease  lung or  breathing disease, like asthma or emphysema  pheochromocytoma  slow or irregular heartbeat  thyroid disease  an unusual or allergic reaction to carvedilol, other beta-blockers, medicines, foods, dyes, or preservatives  pregnant or trying to get pregnant  breast-feeding How should I use this medicine? Take this drug by mouth. Take it as directed on the prescription label at the same time every day. Take it with food. Keep taking it unless your health care provider tells you to stop. Talk to your health care provider about the use of this drug in children. Special care may  be needed. Overdosage: If you think you have taken too much of this medicine contact a poison control center or emergency room at once. NOTE: This medicine is only for you. Do not share this medicine with others. What if I miss a dose? If you miss a dose, take it as soon as you can. If it is almost time for your next dose, take only that dose. Do not take double or extra doses. What may interact with this medicine? This medicine may interact with the following medications:  certain medicines for blood pressure, heart disease, irregular heart beat  certain medicines for depression, like fluoxetine or paroxetine  certain medicines for diabetes, like glipizide or glyburide  cimetidine  clonidine  cyclosporine  digoxin  MAOIs like Carbex, Eldepryl, Marplan, Nardil, and Parnate  reserpine  rifampin This list may not describe all possible interactions. Give your health care provider a list of all the medicines, herbs, non-prescription drugs, or dietary supplements you use. Also tell them if you smoke, drink alcohol, or use illegal drugs. Some items may interact with your medicine. What should I watch for while using this medicine? Check your heart rate and blood pressure regularly while you are taking this medicine. Ask your doctor or health care professional what your heart rate and blood pressure should be, and when you should contact him or her. Do not stop taking this medicine suddenly. This could lead to serious heart-related effects. Contact your doctor or health care professional if you have difficulty breathing while taking this drug. Check your weight daily. Ask your doctor or health care professional when you should notify him/her of any weight gain. You may get drowsy or dizzy. Do not drive, use machinery, or do anything that requires mental alertness until you know how this medicine affects you. To reduce the risk of dizzy or fainting spells, do not sit or stand up quickly.  Alcohol can make you more drowsy, and increase flushing and rapid heartbeats. Avoid alcoholic drinks. This medicine may increase blood sugar. Ask your healthcare provider if changes in diet or medicines are needed if you have diabetes. If you are going to have surgery, tell your doctor or health care professional that you are taking this medicine. What side effects may I notice from receiving this medicine? Side effects that you should report to your doctor or health care professional as soon as possible:  allergic reactions like skin rash, itching or hives, swelling of the face, lips, or tongue  breathing problems  dark urine  irregular heartbeat   signs and symptoms of high blood sugar such as being more thirsty or hungry or having to urinate more than normal. You may also feel very tired or have blurry vision.  swollen legs or ankles  vomiting  yellowing of the eyes or skin Side effects that usually do not require medical attention (report to your doctor or health care professional if they  continue or are bothersome):  change in sex drive or performance  diarrhea  dry eyes (especially if wearing contact lenses)  dry, itching skin  headache  nausea  unusually tired This list may not describe all possible side effects. Call your doctor for medical advice about side effects. You may report side effects to FDA at 1-800-FDA-1088. Where should I keep my medicine? Keep out of the reach of children and pets. Store at room temperature between 20 and 25 degrees C (68 and 77 degrees F). Protect from moisture. Keep the container tightly closed. Throw away any unused drug after the expiration date. NOTE: This sheet is a summary. It may not cover all possible information. If you have questions about this medicine, talk to your doctor, pharmacist, or health care provider.  2020 Elsevier/Gold Standard (2019-04-03 17:42:09)  Shortness of Breath, Adult Shortness of breath means you  have trouble breathing. Shortness of breath could be a sign of a medical problem. Follow these instructions at home:   Watch for any changes in your symptoms.  Do not use any products that contain nicotine or tobacco, such as cigarettes, e-cigarettes, and chewing tobacco.  Do not smoke. Smoking can cause shortness of breath. If you need help to quit smoking, ask your doctor.  Avoid things that can make it harder to breathe, such as: ? Mold. ? Dust. ? Air pollution. ? Chemical smells. ? Things that can cause allergy symptoms (allergens), if you have allergies.  Keep your living space clean. Use products that help remove mold and dust.  Rest as needed. Slowly return to your normal activities.  Take over-the-counter and prescription medicines only as told by your doctor. This includes oxygen therapy and inhaled medicines.  Keep all follow-up visits as told by your doctor. This is important. Contact a doctor if:  Your condition does not get better as soon as expected.  You have a hard time doing your normal activities, even after you rest.  You have new symptoms. Get help right away if:  Your shortness of breath gets worse.  You have trouble breathing when you are resting.  You feel light-headed or you pass out (faint).  You have a cough that is not helped by medicines.  You cough up blood.  You have pain with breathing.  You have pain in your chest, arms, shoulders, or belly (abdomen).  You have a fever.  You cannot walk up stairs.  You cannot exercise the way you normally do. These symptoms may represent a serious problem that is an emergency. Do not wait to see if the symptoms will go away. Get medical help right away. Call your local emergency services (911 in the [place] Do not drive yourself to the hospital. Summary  Shortness of breath is when you have trouble breathing enough air. It can be a sign of a medical problem.  Avoid things that make it hard for  you to breathe, such as smoking, pollution, mold, and dust.  Watch for any changes in your symptoms. Contact your doctor if you do not get better or you get worse. This information is not intended to replace advice given to you by your health care provider. Make sure you discuss any questions you have with your health care provider. Document Revised: 01/27/2018 Document Reviewed: 01/27/2018 Elsevier Patient Education  [place].

## 2020-06-27 NOTE — Progress Notes (Signed)
Subjective:    Patient ID: Suzanne Conner, female    DOB: 10-25-1945, 74 y.o.   MRN: [id number]  No chief complaint on file.   HPI Patient was seen today for f/u on HTN. Pt notes elevation in bp and pulse.  Pt taking micardis 80-12.5 mg daily.  States bp 150-160s/75-85.  Pt cut down on sodium intake, has tried to walk more and make other changes.  Pt has been on micardis x 10 yrs.  Pt did not start zoloft 25 mg.  Pt picked up the med but was hesitant to start it 2/2 the possible s/e.  Pt states she is feeling slightly better.  Pt notes occasional "breathiness".  Denies CP, palpitations.  Past Medical History:  Diagnosis Date  . Arthritis    "left hip; right wrist" (06/12/2018)  . Depression   . Family history of polyps in the colon   . GERD (gastroesophageal reflux disease)   . Glaucoma   . Hypertension   . Type 2 diabetes, diet controlled (HCC)     Allergies  Allergen Reactions  . Penicillins Hives and Other (See Comments)    PATIENT HAS HAD A PCN REACTION WITH IMMEDIATE RASH, FACIAL/TONGUE/THROAT SWELLING, SOB, OR LIGHTHEADEDNESS WITH HYPOTENSION:  #  #  YES  #  #  INCLUDING HIVES AS A CHILD Has patient had a PCN reaction causing severe rash involving mucus membranes or skin necrosis: No Has patient had a PCN reaction that required hospitalization: No Has patient had a PCN reaction occurring within the last 10 years: No If all of the above answers are "NO", then may proceed with Cephalosporin use. PATIENT HAS HAD A PCN REACTION WITH IMMEDIATE RASH, FACIAL/TONGUE/THROAT SWELLING, SOB, OR LIGHTHEADEDNESS WITH HYPOTENSION: # # YES # # INCLUDING HIVES AS A CHILD Has patient had a PCN reaction causing severe rash involving mucus membranes or skin necrosis: No Has patient had a PCN reaction that required hospitalization: No Has patient had a PCN reaction occurring within the last 10 years: No If all of the above answers are "NO", then may proceed with Cephalosporin use.   Marland Kitchen Paxil  [Paroxetine]     Paxil 20 mg caused a tremor.    ROS General: Denies fever, chills, night sweats, changes in weight, changes in appetite HEENT: Denies headaches, ear pain, changes in vision, rhinorrhea, sore throat CV: Denies CP, palpitations, SOB, orthopnea Pulm: Denies SOB, cough, wheezing +breathiness  GI: Denies abdominal pain, nausea, vomiting, diarrhea, constipation GU: Denies dysuria, hematuria, frequency, vaginal discharge Msk: Denies muscle cramps, joint pains Neuro: Denies weakness, numbness, tingling Skin: Denies rashes, bruising Psych: Denies hallucinations  +anxiety and depression.    Objective:    Blood pressure (!) 150/80, pulse (!) 101, temperature 98.3 F (36.8 C), temperature source Oral, weight 201 lb 6.4 oz (91.4 kg), SpO2 99 %.  Gen. Pleasant, well-nourished, in no distress, normal affect  HEENT: [place]/AT, face symmetric, conjunctiva clear, no scleral icterus, PERRLA, EOMI, nares patent without drainage Lungs: no accessory muscle use, CTAB, no wheezes or rales Cardiovascular: RRR, no m/r/g, no peripheral edema Musculoskeletal: No deformities, no cyanosis or clubbing, normal tone Neuro:  A&Ox3, CN II-XII intact, normal gait Skin:  Warm, no lesions/ rash   Wt Readings from Last 3 Encounters:  06/27/20 201 lb 6.4 oz (91.4 kg)  05/25/20 204 lb (92.5 kg)  04/20/20 205 lb (93 kg)    Lab Results  Component Value Date   WBC 5.4 03/18/2020   HGB 13.0 03/18/2020   HCT  39.8 03/18/2020   PLT 294 03/18/2020   GLUCOSE 104 (H) 03/18/2020   CHOL 135 03/18/2020   TRIG 74 03/18/2020   HDL 44 (L) 03/18/2020   LDLCALC 76 03/18/2020   NA 141 03/18/2020   K 3.9 03/18/2020   CL 102 03/18/2020   CREATININE 0.76 03/18/2020   BUN 16 03/18/2020   CO2 30 03/18/2020   TSH 0.80 03/18/2020   HGBA1C 5.6 03/18/2020    Assessment/Plan:  Essential hypertension  -uncontrolled -continue Micardis 80-12.5 mg -will add Coreg 3.125 mg BID -continue lifestyle modifications   -continue checking bp at home and keeping a log to bring with you to clinic - Plan: carvedilol (COREG) 3.125 MG tablet  Anxiety and depression -PHQ 9 score 3 -GAD 7 score 2 -will remove Zoloft 25 mg from med list as pt never started med -discussed self care.  Consider counseling -will continue to monitor  SOB (shortness of breath)  - Plan: DG Chest 2 View  Mild intermittent asthma without complication  - Plan: albuterol (VENTOLIN HFA) 108 (90 Base) MCG/ACT inhaler  F/u in 1 month, sooner if needed.  Grier Mitts, MD

## 2020-06-28 ENCOUNTER — Other Ambulatory Visit: Payer: Self-pay

## 2020-06-30 ENCOUNTER — Other Ambulatory Visit: Payer: Self-pay | Admitting: Family Medicine

## 2020-06-30 ENCOUNTER — Encounter: Payer: Self-pay | Admitting: Family Medicine

## 2020-06-30 DIAGNOSIS — R9389 Abnormal findings on diagnostic imaging of other specified body structures: Secondary | ICD-10-CM

## 2020-07-12 ENCOUNTER — Other Ambulatory Visit: Payer: Medicare Other

## 2020-07-12 ENCOUNTER — Other Ambulatory Visit: Payer: Self-pay | Admitting: Family Medicine

## 2020-07-12 ENCOUNTER — Other Ambulatory Visit: Payer: Self-pay

## 2020-07-12 ENCOUNTER — Ambulatory Visit (INDEPENDENT_AMBULATORY_CARE_PROVIDER_SITE_OTHER): Payer: Medicare Other

## 2020-07-12 DIAGNOSIS — R911 Solitary pulmonary nodule: Secondary | ICD-10-CM | POA: Diagnosis not present

## 2020-07-12 DIAGNOSIS — R9389 Abnormal findings on diagnostic imaging of other specified body structures: Secondary | ICD-10-CM

## 2020-07-19 ENCOUNTER — Other Ambulatory Visit: Payer: Self-pay | Admitting: Family Medicine

## 2020-07-19 DIAGNOSIS — K219 Gastro-esophageal reflux disease without esophagitis: Secondary | ICD-10-CM

## 2020-07-22 DIAGNOSIS — R062 Wheezing: Secondary | ICD-10-CM | POA: Diagnosis not present

## 2020-07-22 DIAGNOSIS — R0602 Shortness of breath: Secondary | ICD-10-CM | POA: Diagnosis not present

## 2020-07-22 DIAGNOSIS — Z20822 Contact with and (suspected) exposure to covid-19: Secondary | ICD-10-CM | POA: Diagnosis not present

## 2020-07-22 DIAGNOSIS — J4541 Moderate persistent asthma with (acute) exacerbation: Secondary | ICD-10-CM | POA: Diagnosis not present

## 2020-07-28 ENCOUNTER — Other Ambulatory Visit: Payer: Self-pay | Admitting: Family Medicine

## 2020-07-28 DIAGNOSIS — I10 Essential (primary) hypertension: Secondary | ICD-10-CM

## 2020-08-11 ENCOUNTER — Ambulatory Visit (INDEPENDENT_AMBULATORY_CARE_PROVIDER_SITE_OTHER): Payer: Medicare Other | Admitting: Family Medicine

## 2020-08-11 ENCOUNTER — Encounter: Payer: Self-pay | Admitting: Family Medicine

## 2020-08-11 ENCOUNTER — Other Ambulatory Visit: Payer: Self-pay

## 2020-08-11 VITALS — BP 138/76 | HR 59 | Temp 98.5°F | Wt 208.8 lb

## 2020-08-11 DIAGNOSIS — J40 Bronchitis, not specified as acute or chronic: Secondary | ICD-10-CM | POA: Diagnosis not present

## 2020-08-11 DIAGNOSIS — I10 Essential (primary) hypertension: Secondary | ICD-10-CM | POA: Diagnosis not present

## 2020-08-11 DIAGNOSIS — L219 Seborrheic dermatitis, unspecified: Secondary | ICD-10-CM

## 2020-08-11 MED ORDER — PREDNISONE 10 MG PO TABS: ORAL_TABLET | ORAL | 0 refills | Status: DC

## 2020-08-11 MED ORDER — METOPROLOL TARTRATE 25 MG PO TABS: 12.5000 mg | ORAL_TABLET | Freq: Two times a day (BID) | ORAL | 3 refills | Status: DC

## 2020-08-11 NOTE — Patient Instructions (Addendum)
Acute Bronchitis, Adult  Acute bronchitis is sudden or acute swelling of the air tubes (bronchi) in the lungs. Acute bronchitis causes these tubes to fill with mucus, which can make it hard to breathe. It can also cause coughing or wheezing. In adults, acute bronchitis usually goes away within 2 weeks. A cough caused by bronchitis may last up to 3 weeks. Smoking, allergies, and asthma can make the condition worse. What are the causes? This condition can be caused by germs and by substances that irritate the lungs, including:  Cold and flu viruses. The most common cause of this condition is the virus that causes the common cold.  Bacteria.  Substances that irritate the lungs, including: ? Smoke from cigarettes and other forms of tobacco. ? Dust and pollen. ? Fumes from chemical products, gases, or burned fuel. ? Other materials that pollute indoor or outdoor air.  Close contact with someone who has acute bronchitis. What increases the risk? The following factors may make you more likely to develop this condition:  A weak body's defense system, also called the immune system.  A condition that affects your lungs and breathing, such as asthma. What are the signs or symptoms? Common symptoms of this condition include:  Lung and breathing problems, such as: ? Coughing. This may bring up clear, yellow, or green mucus from your lungs (sputum). ? Wheezing. ? Having too much mucus in your lungs (chest congestion). ? Having shortness of breath.  A fever.  Chills.  Aches and pains, including: ? Tightness in your chest and other body aches. ? A sore throat. How is this diagnosed? This condition is usually diagnosed based on:  Your symptoms and medical history.  A physical exam. You may also have other tests, including tests to rule out other conditions, such pneumonia. These tests include:  A test of lung function.  Test of a mucus sample to look for the presence of  bacteria.  Tests to check the oxygen level in your blood.  Blood tests.  Chest X-ray. How is this treated? Most cases of acute bronchitis clear up over time without treatment. Your health care provider may recommend:  Drinking more fluids. This can thin your mucus, which may improve your breathing.  Taking a medicine for a fever or cough.  Using a device that gets medicine into your lungs (inhaler) to help improve breathing and control coughing.  Using a vaporizer or a humidifier. These are machines that add water to the air to help you breathe better. Follow these instructions at home: Activity  Get plenty of rest.  Return to your normal activities as told by your health care provider. Ask your health care provider what activities are safe for you. Lifestyle  Drink enough fluid to keep your urine pale yellow.  Do not drink alcohol.  Do not use any products that contain nicotine or tobacco, such as cigarettes, e-cigarettes, and chewing tobacco. If you need help quitting, ask your health care provider. Be aware that: ? Your bronchitis will get worse if you smoke or breathe in other people's smoke (secondhand smoke). ? Your lungs will heal faster if you quit smoking. General instructions   Take over-the-counter and prescription medicines only as told by your health care provider.  Use an inhaler, vaporizer, or humidifier as told by your health care provider.  If you have a sore throat, gargle with a salt-water mixture 3-4 times a day or as needed. To make a salt-water mixture, completely dissolve -1 tsp (3-6   g) of salt in 1 cup (237 mL) of warm water.  Keep all follow-up visits as told by your health care provider. This is important. How is this prevented? To lower your risk of getting this condition again:  Wash your hands often with soap and water. If soap and water are not available, use hand sanitizer.  Avoid contact with people who have cold symptoms.  Try not to  touch your mouth, nose, or eyes with your hands.  Avoid places where there are fumes from chemicals. Breathing these fumes will make your condition worse.  Get the flu shot every year. Contact a health care provider if:  Your symptoms do not improve after 2 weeks of treatment.  You vomit more than once or twice.  You have symptoms of dehydration such as: ? Dark urine. ? Dry skin or eyes. ? Increased thirst. ? Headaches. ? Confusion. ? Muscle cramps. Get help right away if you:  Cough up blood.  Feel pain in your chest.  Have severe shortness of breath.  Faint or keep feeling like you are going to faint.  Have a severe headache.  Have fever or chills that get worse. These symptoms may represent a serious problem that is an emergency. Do not wait to see if the symptoms will go away. Get medical help right away. Call your local emergency services (911 in the [place] Do not drive yourself to the hospital. Summary  Acute bronchitis is sudden (acute) inflammation of the air tubes (bronchi) between the windpipe and the lungs. In adults, acute bronchitis usually goes away within 2 weeks, although coughing may last 3 weeks or longer  Take over-the-counter and prescription medicines only as told by your health care provider.  Drink enough fluid to keep your urine pale yellow.  Contact a health care provider if your symptoms do not improve after 2 weeks of treatment.  Get help right away if you cough up blood, faint, or have chest pain or shortness of breath. This information is not intended to replace advice given to you by your health care provider. Make sure you discuss any questions you have with your health care provider. Document Revised: 05/11/2019 Document Reviewed: 03/20/2019 Elsevier Patient Education  [place] Your Hypertension Hypertension is commonly called high blood pressure. This is when the force of your blood pressing against the walls of  your arteries is too strong. Arteries are blood vessels that carry blood from your heart throughout your body. Hypertension forces the heart to work harder to pump blood, and may cause the arteries to become narrow or stiff. Having untreated or uncontrolled hypertension can cause heart attack, stroke, kidney disease, and other problems. What are blood pressure readings? A blood pressure reading consists of a higher number over a lower number. Ideally, your blood pressure should be below 120/80. The first ("top") number is called the systolic pressure. It is a measure of the pressure in your arteries as your heart beats. The second ("bottom") number is called the diastolic pressure. It is a measure of the pressure in your arteries as the heart relaxes. What does my blood pressure reading mean? Blood pressure is classified into four stages. Based on your blood pressure reading, your health care provider may use the following stages to determine what type of treatment you need, if any. Systolic pressure and diastolic pressure are measured in a unit called mm Hg. Normal  Systolic pressure: below 825.  Diastolic pressure: below 80. Elevated  Systolic  pressure: 120-129.  Diastolic pressure: below 80. Hypertension stage 1  Systolic pressure: 209-470.  Diastolic pressure: 96-28. Hypertension stage 2  Systolic pressure: 366 or above.  Diastolic pressure: 90 or above. What health risks are associated with hypertension? Managing your hypertension is an important responsibility. Uncontrolled hypertension can lead to:  A heart attack.  A stroke.  A weakened blood vessel (aneurysm).  Heart failure.  Kidney damage.  Eye damage.  Metabolic syndrome.  Memory and concentration problems. What changes can I make to manage my hypertension? Hypertension can be managed by making lifestyle changes and possibly by taking medicines. Your health care provider will help you make a plan to bring your  blood pressure within a normal range. Eating and drinking   Eat a diet that is high in fiber and potassium, and low in salt (sodium), added sugar, and fat. An example eating plan is called the DASH (Dietary Approaches to Stop Hypertension) diet. To eat this way: ? Eat plenty of fresh fruits and vegetables. Try to fill half of your plate at each meal with fruits and vegetables. ? Eat whole grains, such as whole wheat pasta, brown rice, or whole grain bread. Fill about one quarter of your plate with whole grains. ? Eat low-fat diary products. ? Avoid fatty cuts of meat, processed or cured meats, and poultry with skin. Fill about one quarter of your plate with lean proteins such as fish, chicken without skin, beans, eggs, and tofu. ? Avoid premade and processed foods. These tend to be higher in sodium, added sugar, and fat.  Reduce your daily sodium intake. Most people with hypertension should eat less than 1,500 mg of sodium a day.  Limit alcohol intake to no more than 1 drink a day for nonpregnant women and 2 drinks a day for men. One drink equals 12 oz of beer, 5 oz of wine, or 1 oz of hard liquor. Lifestyle  Work with your health care provider to maintain a healthy body weight, or to lose weight. Ask what an ideal weight is for you.  Get at least 30 minutes of exercise that causes your heart to beat faster (aerobic exercise) most days of the week. Activities may include walking, swimming, or biking.  Include exercise to strengthen your muscles (resistance exercise), such as weight lifting, as part of your weekly exercise routine. Try to do these types of exercises for 30 minutes at least 3 days a week.  Do not use any products that contain nicotine or tobacco, such as cigarettes and e-cigarettes. If you need help quitting, ask your health care provider.  Control any long-term (chronic) conditions you have, such as high cholesterol or diabetes. Monitoring  Monitor your blood pressure at  home as told by your health care provider. Your personal target blood pressure may vary depending on your medical conditions, your age, and other factors.  Have your blood pressure checked regularly, as often as told by your health care provider. Working with your health care provider  Review all the medicines you take with your health care provider because there may be side effects or interactions.  Talk with your health care provider about your diet, exercise habits, and other lifestyle factors that may be contributing to hypertension.  Visit your health care provider regularly. Your health care provider can help you create and adjust your plan for managing hypertension. Will I need medicine to control my blood pressure? Your health care provider may prescribe medicine if lifestyle changes are not enough  to get your blood pressure under control, and if:  Your systolic blood pressure is 130 or higher.  Your diastolic blood pressure is 80 or higher. Take medicines only as told by your health care provider. Follow the directions carefully. Blood pressure medicines must be taken as prescribed. The medicine does not work as well when you skip doses. Skipping doses also puts you at risk for problems. Contact a health care provider if:  You think you are having a reaction to medicines you have taken.  You have repeated (recurrent) headaches.  You feel dizzy.  You have swelling in your ankles.  You have trouble with your vision. Get help right away if:  You develop a severe headache or confusion.  You have unusual weakness or numbness, or you feel faint.  You have severe pain in your chest or abdomen.  You vomit repeatedly.  You have trouble breathing. Summary  Hypertension is when the force of blood pumping through your arteries is too strong. If this condition is not controlled, it may put you at risk for serious complications.  Your personal target blood pressure may vary  depending on your medical conditions, your age, and other factors. For most people, a normal blood pressure is less than 120/80.  Hypertension is managed by lifestyle changes, medicines, or both. Lifestyle changes include weight loss, eating a healthy, low-sodium diet, exercising more, and limiting alcohol. This information is not intended to replace advice given to you by your health care provider. Make sure you discuss any questions you have with your health care provider. Document Revised: 12/19/2018 Document Reviewed: 07/25/2016 Elsevier Patient Education  [place].

## 2020-08-11 NOTE — Progress Notes (Signed)
Subjective:    Patient ID: Suzanne Conner, female    DOB: 02/28/1946, 74 y.o.   MRN: [id number]  No chief complaint on file.   HPI Pt is a 74 yo female with pmh sig for HTN, DM 2, GERD, history of rotator cuff tear, seborrheic dermatitis, depression, anxiety who presents for follow-up on ongoing condition.  Patient endorses continued shortness of breath and wheezing.  Notices symptoms more at night.  Seen at [facility] on 07/22/2020 for SOD.  Given Depo-Medrol, breathing treatment x2, and azithromycin.  BP this morning 140/75 at home.  Typically 142/79, 140/78, 133/76, 146/78, 144/79.  Pulse typically 70s-80s.  Patient notes feeling tired and not herself on Coreg 3.125 mg.  Also taking Micardis 80-12.5 mg.  Past Medical History:  Diagnosis Date  . Arthritis    "left hip; right wrist" (06/12/2018)  . Depression   . Family history of polyps in the colon   . GERD (gastroesophageal reflux disease)   . Glaucoma   . Hypertension   . Type 2 diabetes, diet controlled (HCC)     Allergies  Allergen Reactions  . Penicillins Hives and Other (See Comments)    PATIENT HAS HAD A PCN REACTION WITH IMMEDIATE RASH, FACIAL/TONGUE/THROAT SWELLING, SOB, OR LIGHTHEADEDNESS WITH HYPOTENSION:  #  #  YES  #  #  INCLUDING HIVES AS A CHILD Has patient had a PCN reaction causing severe rash involving mucus membranes or skin necrosis: No Has patient had a PCN reaction that required hospitalization: No Has patient had a PCN reaction occurring within the last 10 years: No If all of the above answers are "NO", then may proceed with Cephalosporin use. PATIENT HAS HAD A PCN REACTION WITH IMMEDIATE RASH, FACIAL/TONGUE/THROAT SWELLING, SOB, OR LIGHTHEADEDNESS WITH HYPOTENSION: # # YES # # INCLUDING HIVES AS A CHILD Has patient had a PCN reaction causing severe rash involving mucus membranes or skin necrosis: No Has patient had a PCN reaction that required hospitalization: No Has patient had a PCN reaction occurring  within the last 10 years: No If all of the above answers are "NO", then may proceed with Cephalosporin use.   Marland Kitchen Paxil [Paroxetine]     Paxil 20 mg caused a tremor.    ROS General: Denies fever, chills, night sweats, changes in weight, changes in appetite  + fatigue, "not herself" HEENT: Denies headaches, ear pain, changes in vision, rhinorrhea, sore throat CV: Denies CP, palpitations, orthopnea  + SOB Pulm: Denies cough   + SOB, wheezing GI: Denies abdominal pain, nausea, vomiting, diarrhea, constipation GU: Denies dysuria, hematuria, frequency, vaginal discharge Msk: Denies muscle cramps, joint pains Neuro: Denies weakness, numbness, tingling Skin: Denies rashes, bruising Psych: Denies depression, anxiety, hallucinations    Objective:    Blood pressure 138/76, pulse (!) 59, temperature 98.5 F (36.9 C), temperature source Oral, weight 208 lb 12.8 oz (94.7 kg), SpO2 99 %.  Gen. Pleasant, well-nourished, in no distress, normal affect   HEENT: [place]/AT, face symmetric, conjunctiva clear, no scleral icterus, PERRLA, EOMI, nares patent without drainage, pharynx without erythema or exudate. Lungs: no accessory muscle use, CTAB, no wheezes or rales Cardiovascular: RRR, no m/r/g, no peripheral edema Musculoskeletal: No deformities, no cyanosis or clubbing, normal tone Neuro:  A&Ox3, CN II-XII intact, normal gait Skin:  Warm, no lesions   Wt Readings from Last 3 Encounters:  08/11/20 208 lb 12.8 oz (94.7 kg)  06/27/20 201 lb 6.4 oz (91.4 kg)  05/25/20 204 lb (92.5 kg)    Lab Results  Component Value Date   WBC 5.4 03/18/2020   HGB 13.0 03/18/2020   HCT 39.8 03/18/2020   PLT 294 03/18/2020   GLUCOSE 104 (H) 03/18/2020   CHOL 135 03/18/2020   TRIG 74 03/18/2020   HDL 44 (L) 03/18/2020   LDLCALC 76 03/18/2020   NA 141 03/18/2020   K 3.9 03/18/2020   CL 102 03/18/2020   CREATININE 0.76 03/18/2020   BUN 16 03/18/2020   CO2 30 03/18/2020   TSH 0.80 03/18/2020   HGBA1C 5.6  03/18/2020    Assessment/Plan:  Bronchitis -No wheezing on exam -Also consider GERD.  Continue omeprazole 40 mg -Will start prednisone taper -Continue albuterol inhaler as needed -For continued symptoms obtain CXR. - Plan: predniSONE (DELTASONE) 10 MG tablet  Essential hypertension  -Elevated at home -Discussed discontinuing Coreg 3.125 mg twice daily -We will try Lopressor -Continue Micardis 80-12.5 mg daily -Patient encouraged to check BP at home and keep a log to bring with her to clinic as well as machine. -Increase p.o. intake of water and fluids - Plan: metoprolol tartrate (LOPRESSOR) 25 MG tablet  Seborrheic dermatitis -discussed supportive care -continue clobetasol shampoo prn  F/u prn  Grier Mitts, MD

## 2020-08-12 DIAGNOSIS — H401132 Primary open-angle glaucoma, bilateral, moderate stage: Secondary | ICD-10-CM | POA: Diagnosis not present

## 2020-08-21 ENCOUNTER — Encounter: Payer: Self-pay | Admitting: Family Medicine

## 2020-08-25 ENCOUNTER — Other Ambulatory Visit: Payer: Self-pay

## 2020-08-25 ENCOUNTER — Ambulatory Visit (INDEPENDENT_AMBULATORY_CARE_PROVIDER_SITE_OTHER): Payer: Medicare Other | Admitting: Family Medicine

## 2020-08-25 ENCOUNTER — Encounter: Payer: Self-pay | Admitting: Family Medicine

## 2020-08-25 VITALS — BP 135/62 | HR 86 | Temp 98.0°F | Wt 209.0 lb

## 2020-08-25 DIAGNOSIS — I10 Essential (primary) hypertension: Secondary | ICD-10-CM

## 2020-08-25 DIAGNOSIS — J4540 Moderate persistent asthma, uncomplicated: Secondary | ICD-10-CM | POA: Diagnosis not present

## 2020-08-25 MED ORDER — ADVAIR HFA 115-21 MCG/ACT IN AERO: 2.0000 | INHALATION_SPRAY | Freq: Two times a day (BID) | RESPIRATORY_TRACT | 3 refills | Status: DC

## 2020-08-25 MED ORDER — METOPROLOL TARTRATE 25 MG PO TABS: 12.5000 mg | ORAL_TABLET | Freq: Two times a day (BID) | ORAL | 3 refills | Status: DC

## 2020-08-25 NOTE — Progress Notes (Signed)
Subjective:    Patient ID: Suzanne Conner, female    DOB: [birth year], 74 y.o.   MRN: [id number]  No chief complaint on file.   HPI Patient is a 74 yo female with pmh sig for HTN, GERD, DM II, arthritis, seborrheic dermatitis, h/o glaucoma, s/p reverse total shoulder arthroplasty, h/o depression who was seen today for f/u on breathing and HTN.  Pt with continued wheezing at night after completing prednisone course.  Using albuterol inhaler as needed.  Notes history of childhood asthma and allergy to cats and pine trees.  [place], endorses biopsy.  Cannot remember the name of the hospital system.    Pt checking BP at home readings have been 143/79, 159/81, 140/76, 143/74 with pulse 75-91 in a.m. prior to taking medications.  Patient currently taking metoprolol tartrate 12.5 mg twice daily and Micardis 80-12.5 mg.  Patient has BP cuff with her this visit.  Home Omron cuff reading 160/79 and 149/79 in office.  Patient's BP cuff is several years old.  Past Medical History:  Diagnosis Date  . Arthritis    "left hip; right wrist" (06/12/2018)  . Depression   . Family history of polyps in the colon   . GERD (gastroesophageal reflux disease)   . Glaucoma   . Hypertension   . Type 2 diabetes, diet controlled (HCC)     Allergies  Allergen Reactions  . Penicillins Hives and Other (See Comments)    PATIENT HAS HAD A PCN REACTION WITH IMMEDIATE RASH, FACIAL/TONGUE/THROAT SWELLING, SOB, OR LIGHTHEADEDNESS WITH HYPOTENSION:  #  #  YES  #  #  INCLUDING HIVES AS A CHILD Has patient had a PCN reaction causing severe rash involving mucus membranes or skin necrosis: No Has patient had a PCN reaction that required hospitalization: No Has patient had a PCN reaction occurring within the last 10 years: No If all of the above answers are "NO", then may proceed with Cephalosporin use. PATIENT HAS HAD A PCN REACTION WITH IMMEDIATE RASH, FACIAL/TONGUE/THROAT  SWELLING, SOB, OR LIGHTHEADEDNESS WITH HYPOTENSION: # # YES # # INCLUDING HIVES AS A CHILD Has patient had a PCN reaction causing severe rash involving mucus membranes or skin necrosis: No Has patient had a PCN reaction that required hospitalization: No Has patient had a PCN reaction occurring within the last 10 years: No If all of the above answers are "NO", then may proceed with Cephalosporin use.   Marland Kitchen Paxil [Paroxetine]     Paxil 20 mg caused a tremor.    ROS General: Denies fever, chills, night sweats, changes in weight, changes in appetite HEENT: Denies headaches, ear pain, changes in vision, rhinorrhea, sore throat CV: Denies CP, palpitations, orthopnea  + SOB Pulm: Denies SOB, cough  + wheezing, SOB GI: Denies abdominal pain, nausea, vomiting, diarrhea, constipation GU: Denies dysuria, hematuria, frequency, vaginal discharge Msk: Denies muscle cramps, joint pains Neuro: Denies weakness, numbness, tingling Skin: Denies rashes, bruising Psych: Denies depression, anxiety, hallucinations     Objective:    Blood pressure 130/68, pulse 86, temperature 98 F (36.7 C), temperature source Oral, weight 209 lb (94.8 kg), SpO2 99 %.  Repeat BP by this provider 135/62  Gen. Pleasant, well-nourished, in no distress, normal affect   HEENT: [place]/AT, face symmetric, conjunctiva clear, no scleral icterus, PERRLA, EOMI, nares patent without drainage, pharynx without erythema or exudate. Neck: No JVD, no thyromegaly, no carotid bruits Lungs: no accessory muscle use, faint wheezes b/l to mid  lung. Cardiovascular: RRR, no m/r/g, no peripheral edema Musculoskeletal: No deformities, no cyanosis or clubbing, normal tone Neuro:  A&Ox3, CN II-XII intact, normal gait Skin:  Warm, no lesions/ rash   Wt Readings from Last 3 Encounters:  08/25/20 209 lb (94.8 kg)  08/11/20 208 lb 12.8 oz (94.7 kg)  06/27/20 201 lb 6.4 oz (91.4 kg)    Lab Results  Component Value Date   WBC 5.4 03/18/2020    HGB 13.0 03/18/2020   HCT 39.8 03/18/2020   PLT 294 03/18/2020   GLUCOSE 104 (H) 03/18/2020   CHOL 135 03/18/2020   TRIG 74 03/18/2020   HDL 44 (L) 03/18/2020   LDLCALC 76 03/18/2020   NA 141 03/18/2020   K 3.9 03/18/2020   CL 102 03/18/2020   CREATININE 0.76 03/18/2020   BUN 16 03/18/2020   CO2 30 03/18/2020   TSH 0.80 03/18/2020   HGBA1C 5.6 03/18/2020    Assessment/Plan:  Moderate persistent reactive airway disease with wheezing without complication  -CXR on 16/06/9603 with an ovoid opacity in the right perihilar lung indeterminate for summation shadow or nodule.  Follow-up CXR 07/12/2020 with resolution of nodular opacity and no focal pulmonary nodule or infiltrate identified. -Given continued wheezing/symptoms despite prednisone course will start Advair inhaler and place referral to pulmonology -Consider sarcoidosis or autoimmune d/o (SLE) as prior concern for sarcoid and ANA positive with nuclear homogenous pattern on 04/20/2020. -Patient given strict precautions -Follow-up as needed - Plan: fluticasone-salmeterol (ADVAIR HFA) 115-21 MCG/ACT inhaler, Ambulatory referral to Pulmonology  Essential hypertension  -elevated -Discussed obtaining new BP cuff for home use -Continue lifestyle modifications -Continue Micardis 12.5 mg and metoprolol 12.5 mg twice daily - Plan: metoprolol tartrate (LOPRESSOR) 25 MG tablet  F/u prn in 1-2 wks, sooner if needed  Grier Mitts, MD

## 2020-09-05 ENCOUNTER — Telehealth: Payer: Self-pay | Admitting: Family Medicine

## 2020-09-05 NOTE — Telephone Encounter (Signed)
Patient states Dr. Salomon Fick was suppose to put in a referral for her to see Pulmonology.  She is requesting a call back because she has not heard anything.

## 2020-09-15 ENCOUNTER — Other Ambulatory Visit: Payer: Self-pay

## 2020-09-15 ENCOUNTER — Encounter: Payer: Self-pay | Admitting: Family Medicine

## 2020-09-15 ENCOUNTER — Telehealth (INDEPENDENT_AMBULATORY_CARE_PROVIDER_SITE_OTHER): Payer: Medicare Other | Admitting: Family Medicine

## 2020-09-15 DIAGNOSIS — I10 Essential (primary) hypertension: Secondary | ICD-10-CM | POA: Diagnosis not present

## 2020-09-15 DIAGNOSIS — J4540 Moderate persistent asthma, uncomplicated: Secondary | ICD-10-CM

## 2020-09-15 MED ORDER — METOPROLOL TARTRATE 25 MG PO TABS: 25.0000 mg | ORAL_TABLET | Freq: Two times a day (BID) | ORAL | 3 refills | Status: DC

## 2020-09-15 MED ORDER — HYDROCHLOROTHIAZIDE 12.5 MG PO TABS: 12.5000 mg | ORAL_TABLET | Freq: Every day | ORAL | 3 refills | Status: DC

## 2020-09-15 NOTE — Progress Notes (Signed)
Virtual Visit via Video Note  I connected with Suzanne Conner on 09/15/20 at  9:30 AM EST by a video enabled telemedicine application 2/2 [id number] pandemic and verified that I am speaking with the correct person using two identifiers.  Location patient: home Location provider:work or home office Persons participating in the virtual visit: patient, provider  I discussed the limitations of evaluation and management by telemedicine and the availability of in person appointments. The patient expressed understanding and agreed to proceed.   HPI: Pt is a 75 yo female with pmh sig for HTN, DM II, arthritis, GERD, seborrheic dermatitis, h/o depression, h/o glaucoma who is seen for f/u.  Pt notes continued wheezing and SOB with improvement in wheezing if lays on R side.  Has an "echo like wheeze" if lays on back and L side at night.  The Advair inhaler may have helped some.  BP 137/77 and P 87 this am with new cuff.  Pt notes bp has been as high has 140s in am.  Taking micardis at night and metoprolol 12.5 mg BID.  Patient endorses feeling bad/fatigued after taking BP meds.  During visit patient intermittently coughing/clearing throat.  States ongoing x1 month or more, has not really paid attention.  Patient adamant about continuing "water pill" component.  ROS: See pertinent positives and negatives per HPI.  Past Medical History:  Diagnosis Date  . Arthritis    "left hip; right wrist" (06/12/2018)  . Depression   . Family history of polyps in the colon   . GERD (gastroesophageal reflux disease)   . Glaucoma   . Hypertension   . Type 2 diabetes, diet controlled ([place])     Past Surgical History:  Procedure Laterality Date  . BUNIONECTOMY Right   . CATARACT EXTRACTION W/ INTRAOCULAR LENS IMPLANT Right   . JOINT REPLACEMENT    . REPLACEMENT TOTAL KNEE Right 2015  . REVERSE SHOULDER ARTHROPLASTY Left 06/12/2018  . REVERSE SHOULDER ARTHROPLASTY Left 06/12/2018   Procedure: LEFT REVERSE SHOULDER  ARTHROPLASTY;  Surgeon: Justice Britain, MD;  Location: [facility];  Service: Orthopedics;  Laterality: Left;  . SHOULDER ARTHROSCOPY W/ ROTATOR CUFF REPAIR Right 2015  . TUBAL LIGATION      No family history on file.    Current Outpatient Medications:  .  albuterol (VENTOLIN HFA) 108 (90 Base) MCG/ACT inhaler, Inhale 2 puffs into the lungs every 6 (six) hours as needed for wheezing or shortness of breath., Disp: 8 g, Rfl: 2 .  aspirin EC 81 MG tablet, Take 81 mg by mouth daily., Disp: , Rfl:  .  Cholecalciferol (VITAMIN D) 2000 units CAPS, Take 2,000 Units by mouth daily., Disp: , Rfl:  .  Clobetasol Propionate 0.05 % shampoo, Use a small amount on the affected areas twice a day as needed., Disp: 118 mL, Rfl: 0 .  fluticasone-salmeterol (ADVAIR HFA) 115-21 MCG/ACT inhaler, Inhale 2 puffs into the lungs 2 (two) times daily., Disp: 1 each, Rfl: 3 .  hydrocortisone valerate cream (WESTCORT) 0.2 %, Apply 1 application topically 2 (two) times daily., Disp: 45 g, Rfl: 0 .  Influenza Vac High-Dose Quad (FLUZONE HIGH-DOSE QUADRIVALENT) 0.7 ML SUSY, Fluzone High-Dose Quad 2020-21 (PF) 240 mcg/0.7 mL IM syringe, Disp: , Rfl:  .  latanoprost (XALATAN) 0.005 % ophthalmic solution, Apply to eye., Disp: , Rfl:  .  metoprolol tartrate (LOPRESSOR) 25 MG tablet, Take 0.5 tablets (12.5 mg total) by mouth 2 (two) times daily., Disp: 90 tablet, Rfl: 3 .  MICARDIS HCT 80-12.5 MG  tablet, TAKE 1 TABLET DAILY, Disp: 90 tablet, Rfl: 3 .  Multiple Vitamins-Minerals (CENTRUM SILVER PO), Take 1 tablet by mouth daily. , Disp: , Rfl:  .  omeprazole (PRILOSEC) 40 MG capsule, TAKE 1 CAPSULE TWICE A DAY, Disp: 180 capsule, Rfl: 3 .  predniSONE (DELTASONE) 10 MG tablet, Take 4 tabs every morning for 3 days, 3 tabs for 2 days, 2 tabs for 2 days, 1 tab for 1 day., Disp: 23 tablet, Rfl: 0 .  Probiotic Product (TRUNATURE DIGESTIVE PROBIOTIC) CAPS, Take 1 capsule by mouth daily. , Disp: , Rfl:  .  Zoster Vaccine Adjuvanted (SHINGRIX)  injection, Shingrix (PF) 50 mcg/0.5 mL intramuscular suspension, kit  PHARMACIST ADMINISTERED IMMUNIZATION ADMINISTERED AT TIME OF DISPENSING, Disp: , Rfl:   EXAM:  VITALS per patient if applicable: RR between 55-20 bpm, BP 137/77, pulse 87  GENERAL: alert, oriented, appears fatigued but in no acute distress  HEENT: atraumatic, conjunctiva clear, no obvious abnormalities on inspection of external nose and ears  NECK: normal movements of the head and neck  LUNGS: intermittent dry cough/throat clearing.  on inspection no signs of respiratory distress, breathing rate appears normal, no obvious gross SOB, gasping or wheezing  CV: no obvious cyanosis  MS: moves all visible extremities without noticeable abnormality  PSYCH/NEURO: pleasant and cooperative, no obvious depression or anxiety, speech and thought processing grossly intact  ASSESSMENT AND PLAN:  Discussed the following assessment and plan:  Essential hypertension -improving -Will d/c micardis as the telmisartan component may be causing cough/throat clearing -Will continue hydrochlorothiazide 12.5 mg. -Will increase the dose of metoprolol tartrate 12.5 mg twice daily to 25 mg twice daily -Patient to continue checking BP at home and keep a log to bring with her to clinic  - Plan: metoprolol tartrate (LOPRESSOR) 25 MG tablet, hydrochlorothiazide (HYDRODIURIL) 12.5 MG tablet  Moderate persistent reactive airway disease with wheezing without complication -Continue albuterol inhaler as needed and Advair daily -Referral to pulmonology placed  Follow-up in 2-4 weeks sooner if needed   I discussed the assessment and treatment plan with the patient. The patient was provided an opportunity to ask questions and all were answered. The patient agreed with the plan and demonstrated an understanding of the instructions.   The patient was advised to call back or seek an in-person evaluation if the symptoms worsen or if the condition fails  to improve as anticipated.  Billie Ruddy, MD

## 2020-09-23 NOTE — Progress Notes (Signed)
Office Visit Note  Patient: Suzanne Conner             Date of Birth: 1946-03-17           MRN: [id number]             PCP: Billie Ruddy, MD Referring: Billie Ruddy, MD Visit Date: 10/07/2020 Occupation: @GUAROCC @  Subjective:  Pain in multiple joints.   History of Present Illness: Suzanne Conner is a 75 y.o. female seen in consultation per request of her PCP.  According to patient she has had history of osteoarthritis for many years.  She underwent right bunionectomy about 20 years ago while she was living in [place].  She also had right total knee replacement in 2010 in [place].  She had right rotator cuff tear surgery.  She moved to [place] about 2-1/2 years ago.  She has been under care of Dr. Onnie Graham and underwent left reverse total shoulder replacement in October 2019.  She states she did quite well with the surgery.  Over the last few years she has been experiencing increased discomfort in her joints.  She complains of pain and discomfort in her both shoulders, bilateral wrist joints and bilateral hands.  She has noticed some swelling over her wrist joints.  She states her hands are stiff in the morning and she has difficulty making a grip.  She also has discomfort in her hip joints and knee joints when she walks.  She feels some discomfort in her ankle joints.  She was also diagnosed with seborrheic dermatitis about 5 years ago and she was under care of a dermatologist.  She uses topical agents.  Psoriasis is limited to her scalp and her face.  She is gravida 2, para 2, miscarriages 0.  Activities of Daily Living:  Patient reports morning stiffness for all day. Patient Reports nocturnal pain.  Difficulty dressing/grooming: Denies Difficulty climbing stairs: Reports Difficulty getting out of chair: Reports Difficulty using hands for taps, buttons, cutlery, and/or writing: Reports  Review of Systems  Constitutional: Positive for fatigue. Negative for night sweats, weight  gain and weight loss.  HENT: Positive for mouth dryness. Negative for mouth sores, trouble swallowing, trouble swallowing and nose dryness.   Eyes: Positive for dryness. Negative for pain, redness, itching and visual disturbance.  Respiratory: Negative for cough, shortness of breath and difficulty breathing.   Cardiovascular: Negative for chest pain, palpitations, hypertension, irregular heartbeat and swelling in legs/feet.  Gastrointestinal: Negative for blood in stool, constipation and diarrhea.  Endocrine: Negative for increased urination.  Genitourinary: Negative for difficulty urinating and vaginal dryness.  Musculoskeletal: Positive for arthralgias, joint pain, joint swelling, myalgias, morning stiffness, muscle tenderness and myalgias. Negative for muscle weakness.  Skin: Negative for color change, rash, hair loss, redness, skin tightness, ulcers and sensitivity to sunlight.  Allergic/Immunologic: Negative for susceptible to infections.  Neurological: Positive for headaches. Negative for dizziness, numbness, memory loss, night sweats and weakness.  Hematological: Negative for bruising/bleeding tendency and swollen glands.  Psychiatric/Behavioral: Positive for depressed mood. Negative for confusion and sleep disturbance. The patient is nervous/anxious.     PMFS History:  Patient Active Problem List   Diagnosis Date Noted  . Seborrheic dermatitis 08/11/2020  . Essential hypertension 03/26/2020  . Strabismus 03/26/2020  . Depression, major, single episode, mild ([facility]) 03/26/2020  . Chronic pain of multiple joints 03/26/2020  . Anxiety 03/26/2020  . S/p reverse total shoulder arthroplasty 06/12/2018  . Gastroesophageal reflux disease 05/02/2018  . Tear  of left rotator cuff 05/02/2018  . Controlled type 2 diabetes mellitus with complication, without long-term current use of insulin ([place]) 05/02/2018  . S/P right rotator cuff repair 05/02/2018    Past Medical History:  Diagnosis Date   . Arthritis    "left hip; right wrist" (06/12/2018)  . Depression   . Family history of polyps in the colon   . GERD (gastroesophageal reflux disease)   . Glaucoma   . Hypertension   . Type 2 diabetes, diet controlled ([facility])     Family History  Problem Relation Age of Onset  . Asthma Brother   . Diabetes Brother   . Dementia Mother   . Cancer Father   . Heart disease Sister   . Diabetes Brother   . Diabetes Brother   . Healthy Son   . Healthy Daughter    Past Surgical History:  Procedure Laterality Date  . BUNIONECTOMY Right   . CATARACT EXTRACTION W/ INTRAOCULAR LENS IMPLANT Right   . JOINT REPLACEMENT    . REPLACEMENT TOTAL KNEE Right 2015  . REVERSE SHOULDER ARTHROPLASTY Left 06/12/2018  . REVERSE SHOULDER ARTHROPLASTY Left 06/12/2018   Procedure: LEFT REVERSE SHOULDER ARTHROPLASTY;  Surgeon: Justice Britain, MD;  Location: [facility];  Service: Orthopedics;  Laterality: Left;  . SHOULDER ARTHROSCOPY W/ ROTATOR CUFF REPAIR Right 2015  . TUBAL LIGATION     Social History   Social History Narrative  . Not on file   Immunization History  Administered Date(s) Administered  . Fluad Quad(high Dose 65+) 06/22/2020  . Influenza, High Dose Seasonal PF 05/12/2019  . Influenza-Unspecified 05/29/2018, 05/12/2019  . PFIZER(Purple Top)SARS-COV-2 Vaccination 10/01/2019, 10/22/2019, 06/17/2020  . Pneumococcal Polysaccharide-23 06/13/2018  . Zoster Recombinat (Shingrix) 03/10/2019, 05/12/2019     Objective: Vital Signs: BP (!) 149/78 (BP Location: Right Arm, Patient Position: Sitting, Cuff Size: Large)   Pulse 76   Resp 17   Ht 5' 2.25" (1.581 m)   Wt 210 lb 6.4 oz (95.4 kg)   BMI 38.17 kg/m    Physical Exam Vitals and nursing note reviewed.  Constitutional:      Appearance: She is well-developed and well-nourished.  HENT:     Head: Normocephalic and atraumatic.  Eyes:     Extraocular Movements: EOM normal.     Conjunctiva/sclera: Conjunctivae normal.  Cardiovascular:      Rate and Rhythm: Normal rate and regular rhythm.     Pulses: Intact distal pulses.     Heart sounds: Normal heart sounds.  Pulmonary:     Effort: Pulmonary effort is normal.     Breath sounds: Normal breath sounds.  Abdominal:     General: Bowel sounds are normal.     Palpations: Abdomen is soft.  Musculoskeletal:     Cervical back: Normal range of motion.  Lymphadenopathy:     Cervical: No cervical adenopathy.  Skin:    General: Skin is warm and dry.     Capillary Refill: Capillary refill takes less than 2 seconds.  Neurological:     Mental Status: She is alert and oriented to person, place, and time.  Psychiatric:        Mood and Affect: Mood and affect normal.        Behavior: Behavior normal.      Musculoskeletal Exam: C-spine was in good range of motion.  She had no tenderness over thoracic or lumbar spine.  She has some discomfort with range of motion of bilateral shoulder joints.  Elbow joints with good range  of motion with no synovitis.  Wrist joints with good range of motion with no synovitis.  She has prominence of bilateral CMC, bilateral first MCP and PIP joints.  PIP and DIP thickening was noted.  No synovitis was noted.  She had painful range of motion of bilateral hip joints.  Her right knee joint is replaced.  Left knee joint had good range of motion without any warmth.  She had tenderness over bilateral ankle joints with no synovitis.  She had right bunionectomy.  No synovitis was noted over MTPs or PIPs.  CDAI Exam: CDAI Score: -- Patient Global: --; Provider Global: -- Swollen: --; Tender: -- Joint Exam 10/07/2020   No joint exam has been documented for this visit   There is currently no information documented on the homunculus. Go to the Rheumatology activity and complete the homunculus joint exam.  Investigation: No additional findings.  Imaging: XR HIPS BILAT W OR W/O PELVIS 3-4 VIEWS  Result Date: 10/07/2020 Bilateral mild superior lateral narrowing  was noted.  No SI joint narrowing was noted. Impression: These findings are consistent mild osteoarthritis of bilateral hip joints.  XR Ankle 2 Views Left  Result Date: 10/07/2020 No tibiotalar or subtalar joint space narrowing was noted.  No intertarsal joint space narrowing was noted. Impression: Unremarkable x-ray of the ankle joint.  XR Ankle 2 Views Right  Result Date: 10/07/2020 No tibiotalar narrowing was noted.  Mild subtalar narrowing was noted.  No intertarsal narrowing was noted.  No erosive changes were noted. Impression: Osteoarthritic changes were noted in the ankle joint.  XR Hand 2 View Left  Result Date: 10/07/2020 PIP and DIP narrowing was noted.  No MCP, intercarpal radiocarpal joint space narrowing was noted.  CMC narrowing was noted.  No erosive changes were noted. Impression: These findings are consistent with osteoarthritis of the hand.  XR Hand 2 View Right  Result Date: 10/07/2020 PIP and DIP narrowing was noted.  No MCP, intercarpal or radiocarpal joint space narrowing was noted.  Some irregularity was noted over radial aspect of the radius.  It could be possible due to previous trauma. Impression: These findings will consistent with osteoarthritis.  Possible traumatic changes were noted on the radius.   Recent Labs: Lab Results  Component Value Date   WBC 7.4 10/05/2020   HGB 13.0 10/05/2020   PLT 298.0 Repeated and verified X2. 10/05/2020   NA 138 10/05/2020   K 3.9 10/05/2020   CL 100 10/05/2020   CO2 30 10/05/2020   GLUCOSE 92 10/05/2020   BUN 16 10/05/2020   CREATININE 0.71 10/05/2020   BILITOT 0.6 10/05/2020   ALKPHOS 66 10/05/2020   AST 18 10/05/2020   ALT 17 10/05/2020   PROT 7.7 10/05/2020   ALBUMIN 4.1 10/05/2020   CALCIUM 9.9 10/05/2020   GFRAA >60 05/30/2018    Speciality Comments: No specialty comments available.  Procedures:  No procedures performed Allergies: Penicillins and Paxil [paroxetine]   Assessment / Plan:     Visit  Diagnoses: Positive ANA (antinuclear antibody) - 04/20/20: ANA 1:80NH, 1:80cytoplasmic, CRP 19.7, ESR 29, RF<14, Scl-70-, Ro-, La-, SM/RNP-, dsDNA<1, complements WNL, Diem-, thyroperoxidase ab 1.  Patient has low titer positive ANA but no clinical features of autoimmune disease.  She denies any history of oral ulcers, nasal ulcers.  She has mild sicca symptoms most like related to the medications.  There is no history of malar rash, photosensitivity, Raynaud's phenomenon or lymphadenopathy.  There is no family history of autoimmune disease.  Status post reverse total replacement of left shoulder-October 2019 by Dr. Onnie Graham.  Doing well per patient.  S/P right rotator cuff repair-she currently does not have discomfort.  Pain in both hands -she complains of discomfort in her hands.  The clinical findings are consistent with osteoarthritis.  I do not see any synovitis.  Patient gives history of intermittent swelling.  Plan: XR Hand 2 View Right, XR Hand 2 View Left, x-rays are consistent with osteoarthritis.  Some irregularity in the radial aspect of the right radius was noted which could be due to previous trauma.  CK, Uric acid, Cyclic citrul peptide antibody, IgG  Chronic pain of both hips -she complains of pain and discomfort in her bilateral hip joints.  She had limited range of motion of her hip joints.  Plan: XR HIPS BILAT W OR W/O PELVIS 2V.  X-ray showed mild osteoarthritic changes bilaterally.  Status post total right knee replacement-in 2015 while she was living in [place].  She is doing well.  Chronic pain of both ankles -she complains of discomfort in her bilateral ankles with intermittent swelling.  No synovitis was noted.  Plan: XR Ankle 2 Views Right, XR Ankle 2 Views Left.  Left ankle joint x-ray was unremarkable.  Right ankle joint showed subtalar joint space narrowing.  Chronic pain of multiple joints-she has pain and discomfort in multiple joints and stiffness.  Symptoms have been worse  since she moved from [place] to [place] during the cold weather.  Moderate persistent reactive airway disease with wheezing without complication-she has intermittent shortness of breath.  She states wearing mask has not been easy for her.  Essential hypertension-her blood pressure is mildly elevated.  Have advised her to monitor blood pressure closely.  Controlled type 2 diabetes mellitus with complication, without long-term current use of insulin ([facility])  History of gastroesophageal reflux (GERD)  Seborrheic dermatitis-diagnosed by her dermatologist.  She has been using topical agents.  Depression, major, single episode, mild ([facility])-patient relates due to COVID-19 and social isolation.  History of anxiety  Orders: Orders Placed This Encounter  Procedures  . XR Hand 2 View Right  . XR Hand 2 View Left  . XR Ankle 2 Views Right  . XR Ankle 2 Views Left  . XR HIPS BILAT W OR W/O PELVIS 3-4 VIEWS  . CK  . Uric acid  . Cyclic citrul peptide antibody, IgG   No orders of the defined types were placed in this encounter.     Follow-Up Instructions: Return for Pain in multiple joints.   Bo Merino, MD  Note - This record has been created using Editor, commissioning.  Chart creation errors have been sought, but may not always  have been located. Such creation errors do not reflect on  the standard of medical care.

## 2020-09-23 NOTE — Telephone Encounter (Signed)
Referral previously placed.

## 2020-10-05 ENCOUNTER — Other Ambulatory Visit: Payer: Self-pay

## 2020-10-05 ENCOUNTER — Ambulatory Visit (INDEPENDENT_AMBULATORY_CARE_PROVIDER_SITE_OTHER): Payer: Medicare Other | Admitting: Pulmonary Disease

## 2020-10-05 ENCOUNTER — Encounter: Payer: Self-pay | Admitting: Pulmonary Disease

## 2020-10-05 VITALS — BP 136/78 | HR 83 | Temp 97.8°F | Ht 65.0 in | Wt 211.0 lb

## 2020-10-05 DIAGNOSIS — D860 Sarcoidosis of lung: Secondary | ICD-10-CM

## 2020-10-05 DIAGNOSIS — R0602 Shortness of breath: Secondary | ICD-10-CM

## 2020-10-05 DIAGNOSIS — J4540 Moderate persistent asthma, uncomplicated: Secondary | ICD-10-CM

## 2020-10-05 LAB — CBC
HCT: 38.8 % (ref 36.0–46.0)
Hemoglobin: 13 g/dL (ref 12.0–15.0)
MCHC: 33.5 g/dL (ref 30.0–36.0)
MCV: 84.4 fl (ref 78.0–100.0)
Platelets: 298 10*3/uL (ref 150.0–400.0)
RBC: 4.6 Mil/uL (ref 3.87–5.11)
RDW: 14 % (ref 11.5–15.5)
WBC: 7.4 10*3/uL (ref 4.0–10.5)

## 2020-10-05 LAB — COMPREHENSIVE METABOLIC PANEL
ALT: 17 U/L (ref 0–35)
AST: 18 U/L (ref 0–37)
Albumin: 4.1 g/dL (ref 3.5–5.2)
Alkaline Phosphatase: 66 U/L (ref 39–117)
BUN: 16 mg/dL (ref 6–23)
CO2: 30 mEq/L (ref 19–32)
Calcium: 9.9 mg/dL (ref 8.4–10.5)
Chloride: 100 mEq/L (ref 96–112)
Creatinine, Ser: 0.71 mg/dL (ref 0.40–1.20)
GFR: 84.48 mL/min (ref >60.00)
Glucose, Bld: 92 mg/dL (ref 70–99)
Potassium: 3.9 mEq/L (ref 3.5–5.1)
Sodium: 138 mEq/L (ref 135–145)
Total Bilirubin: 0.6 mg/dL (ref 0.2–1.2)
Total Protein: 7.7 g/dL (ref 6.0–8.3)

## 2020-10-05 MED ORDER — ADVAIR HFA 230-21 MCG/ACT IN AERO: 2.0000 | INHALATION_SPRAY | Freq: Two times a day (BID) | RESPIRATORY_TRACT | 12 refills | Status: DC

## 2020-10-05 MED ORDER — ADVAIR HFA 230-21 MCG/ACT IN AERO: 2.0000 | INHALATION_SPRAY | Freq: Two times a day (BID) | RESPIRATORY_TRACT | 0 refills | Status: DC

## 2020-10-05 NOTE — Patient Instructions (Signed)
Mild/moderatere persistent asthma - INCREASE Advair 230-21 mcg TWO puffs TWICE a day. Sent to pharmacy and mail order - CONTINUE Albuterol TWO puffs as needed for shortness of breath or wheezing  Asthma Action Plan Increase Advair 2 puffs three times a day for worsening shortness of breath, wheezing and cough. If you symptoms do not improve in 24-48 hours, please our office for evaluation and/or prednisone taper.  Pulmonary sarcoidosis -  Will arrange pulmonary function test prior to next vist - Recommend annual ophthalmology exam. Please mention at your next visit that you have history of sarcoid - Obtain EKG. If symptoms are persistent or EKG abnormal will obtain echocardiogram.  - Obtained CBC, CMP  Follow-up with me 3 months with PFTs

## 2020-10-05 NOTE — Progress Notes (Signed)
Subjective:   PATIENT ID: Suzanne Conner GENDER: female DOB: 1945-09-14, MRN: [id number]   HPI  Chief Complaint  Patient presents with  . Consult    Chronic SOB with activity, wheezing     Reason for Visit: New consult for shortness of breath and wheezing  Suzanne Conner is a 75 year old female never smoker with childhood asthma, diabetes (diet controlled), hypertension and GERD who presents as a referral from her PCP for shortness of breath and wheezing.  PCP note by Dr. Salomon Fick on 09/15/2020 reviewed.  Has had persistent wheezing and shortness of breath with may be some improvement with Advair inhaler.  Referral to pulmonology placed at that time.  She was previously seen by a Pulmonary in [place] 22 years ago for sarcoid diagnosed via bronchoscopy. She is unable to recall any prolonged treatment. Has not had any respiratory issues since then. Was only seen for ~1 year and reports CXR improved during that time. Denies skin involvement except for seborrheic keratosis. Sees an ophthalmologist for high optic pressures.  Two months ago, She reports shortness of breath and wheezing. She was treated with prednisone and albuterol at an urgent care. Still had persistent wheezing especially worse at night and activity. She lives in a two story home and now having difficulty. Denies cough, chest pain. Compliant with her Advair which she takes two puffs BID. Still has breakthrough wheezing. Does not use albuterol. No longer has nocturnal awakenings since starting Advair  Has had weight gain 15lb in the last two years and some mild joint pain  Social History: Never smoker [employer] wife No pets  Environmental exposures: None  I have personally reviewed patient's past medical/family/social history, allergies, current medications.  Past Medical History:  Diagnosis Date  . Arthritis    "left hip; right wrist" (06/12/2018)  . Depression   . Family history of polyps in the colon   . GERD  (gastroesophageal reflux disease)   . Glaucoma   . Hypertension   . Type 2 diabetes, diet controlled (HCC)      Family History  Problem Relation Age of Onset  . Asthma Brother      Social History   Occupational History  . Not on file  Tobacco Use  . Smoking status: Never Smoker  . Smokeless tobacco: Never Used  Vaping Use  . Vaping Use: Never used  Substance and Sexual Activity  . Alcohol use: Yes    Alcohol/week: 5.0 standard drinks    Types: 5 Glasses of wine per week  . Drug use: Never    Comment: 06/12/2018 CBD oil for pain  . Sexual activity: Yes    Allergies  Allergen Reactions  . Penicillins Hives and Other (See Comments)    PATIENT HAS HAD A PCN REACTION WITH IMMEDIATE RASH, FACIAL/TONGUE/THROAT SWELLING, SOB, OR LIGHTHEADEDNESS WITH HYPOTENSION:  #  #  YES  #  #  INCLUDING HIVES AS A CHILD Has patient had a PCN reaction causing severe rash involving mucus membranes or skin necrosis: No Has patient had a PCN reaction that required hospitalization: No Has patient had a PCN reaction occurring within the last 10 years: No If all of the above answers are "NO", then may proceed with Cephalosporin use. PATIENT HAS HAD A PCN REACTION WITH IMMEDIATE RASH, FACIAL/TONGUE/THROAT SWELLING, SOB, OR LIGHTHEADEDNESS WITH HYPOTENSION: # # YES # # INCLUDING HIVES AS A CHILD Has patient had a PCN reaction causing severe rash involving mucus membranes or skin necrosis: No Has  patient had a PCN reaction that required hospitalization: No Has patient had a PCN reaction occurring within the last 10 years: No If all of the above answers are "NO", then may proceed with Cephalosporin use.   Marland Kitchen Paxil [Paroxetine]     Paxil 20 mg caused a tremor.     Outpatient Medications Prior to Visit  Medication Sig Dispense Refill  . albuterol (VENTOLIN HFA) 108 (90 Base) MCG/ACT inhaler Inhale 2 puffs into the lungs every 6 (six) hours as needed for wheezing or shortness of breath. 8 g 2  .  aspirin EC 81 MG tablet Take 81 mg by mouth daily.    . Cholecalciferol (VITAMIN D) 2000 units CAPS Take 2,000 Units by mouth daily.    . Clobetasol Propionate 0.05 % shampoo Use a small amount on the affected areas twice a day as needed. 118 mL 0  . hydrochlorothiazide (HYDRODIURIL) 12.5 MG tablet Take 1 tablet (12.5 mg total) by mouth daily. 30 tablet 3  . hydrocortisone valerate cream (WESTCORT) 0.2 % Apply 1 application topically 2 (two) times daily. 45 g 0  . Influenza Vac High-Dose Quad (FLUZONE HIGH-DOSE QUADRIVALENT) 0.7 ML SUSY Fluzone High-Dose Quad 2020-21 (PF) 240 mcg/0.7 mL IM syringe    . latanoprost (XALATAN) 0.005 % ophthalmic solution Apply to eye.    . metoprolol tartrate (LOPRESSOR) 25 MG tablet Take 1 tablet (25 mg total) by mouth 2 (two) times daily. 30 tablet 3  . Multiple Vitamins-Minerals (CENTRUM SILVER PO) Take 1 tablet by mouth daily.     Marland Kitchen omeprazole (PRILOSEC) 40 MG capsule TAKE 1 CAPSULE TWICE A DAY 180 capsule 3  . Probiotic Product (TRUNATURE DIGESTIVE PROBIOTIC) CAPS Take 1 capsule by mouth daily.     Marland Kitchen Zoster Vaccine Adjuvanted (SHINGRIX) injection Shingrix (PF) 50 mcg/0.5 mL intramuscular suspension, kit  PHARMACIST ADMINISTERED IMMUNIZATION ADMINISTERED AT TIME OF DISPENSING    . fluticasone-salmeterol (ADVAIR HFA) 115-21 MCG/ACT inhaler Inhale 2 puffs into the lungs 2 (two) times daily. 1 each 3  . predniSONE (DELTASONE) 10 MG tablet Take 4 tabs every morning for 3 days, 3 tabs for 2 days, 2 tabs for 2 days, 1 tab for 1 day. 23 tablet 0   No facility-administered medications prior to visit.    Review of Systems  Constitutional: Negative for chills, diaphoresis, fever, malaise/fatigue and weight loss.  HENT: Negative for congestion, ear pain and sore throat.   Respiratory: Positive for shortness of breath and wheezing. Negative for cough, hemoptysis and sputum production.   Cardiovascular: Negative for chest pain, palpitations and leg swelling.   Gastrointestinal: Negative for abdominal pain, heartburn and nausea.  Genitourinary: Negative for frequency.  Musculoskeletal: Negative for joint pain and myalgias.  Skin: Negative for itching and rash.  Neurological: Negative for dizziness, weakness and headaches.  Endo/Heme/Allergies: Does not bruise/bleed easily.  Psychiatric/Behavioral: Negative for depression. The patient is not nervous/anxious.      Objective:   Vitals:   10/05/20 1026  BP: 136/78  Pulse: 83  Temp: 97.8 F (36.6 C)  SpO2: 100%  Weight: 211 lb (95.7 kg)  Height: $Remove'5\' 5"'vCiNRHs$  (1.651 m)   SpO2: 100 % O2 Device: None (Room air)  Physical Exam: General: Well-appearing, no acute distress HENT: [place], AT, OP clear, MMM Eyes: EOMI, no scleral icterus Respiratory: Clear to auscultation bilaterally.  No crackles, wheezing or rales Cardiovascular: RRR, -M/R/G, no JVD GI: BS+, soft, nontender Extremities:-Edema,-tenderness Neuro: AAO x4, CNII-XII grossly intact Skin: Intact, no rashes or bruising Psych: Normal mood,  normal affect  Data Reviewed:  Imaging: CXR 07/12/2020-no infiltrate, effusion or edema.  PFT: None on file  Labs: CBC    Component Value Date/Time   WBC 5.4 03/18/2020 1135   RBC 4.70 03/18/2020 1135   HGB 13.0 03/18/2020 1135   HCT 39.8 03/18/2020 1135   PLT 294 03/18/2020 1135   MCV 84.7 03/18/2020 1135   MCH 27.7 03/18/2020 1135   MCHC 32.7 03/18/2020 1135   RDW 13.4 03/18/2020 1135   LYMPHSABS 2,025 03/18/2020 1135   EOSABS 70 03/18/2020 1135   BASOSABS 32 03/18/2020 1135  Absolute eos-70  Imaging, labs and tests noted above have been reviewed independently by me.    Assessment & Plan:   Discussion: 75 year old with history of childhood asthma and pulmonary sarcoid who presents for shortness of breath. Based on her history of sarcoid management and normal CXR, she likely had Lofgren syndrome. With her current symptoms I suspect she has asthma rather than reactivation of her  sarcoid. Will increase her ICS/LABA and if symptoms are persistent will consider further work-up at that point.  Mild/moderatere persistent asthma - INCREASE Advair 230-21 mcg TWO puffs TWICE a day. Sent to pharmacy and mail order - CONTINUE Albuterol TWO puffs as needed for shortness of breath or wheezing  Asthma Action Plan Increase Advair 2 puffs three times a day for worsening shortness of breath, wheezing and cough. If you symptoms do not improve in 24-48 hours, please our office for evaluation and/or prednisone taper.  Pulmonary sarcoidosis - Dx in 1999 via endobronchial bx. No records available - No indication for prednisone therapy - Annual PFTs.  Will arrange pulmonary function test prior to next vist - Recommend annual ophthalmology exam.  Last visit on Dec 2021. Please mention at your next visit that you have history of sarcoid - Obtain EKG. If symptoms are persistent will obtain echo. PR interval 130. - Obtained CBC, CMP   Health Maintenance Immunization History  Administered Date(s) Administered  . Fluad Quad(high Dose 65+) 06/22/2020  . Influenza, High Dose Seasonal PF 05/12/2019  . Influenza-Unspecified 05/29/2018, 05/12/2019  . PFIZER(Purple Top)SARS-COV-2 Vaccination 10/01/2019, 10/22/2019, 06/17/2020  . Pneumococcal Polysaccharide-23 06/13/2018  . Zoster Recombinat (Shingrix) 03/10/2019, 05/12/2019   CT Lung Screen - not indicated  No orders of the defined types were placed in this encounter.  Meds ordered this encounter  Medications  . fluticasone-salmeterol (ADVAIR HFA) 230-21 MCG/ACT inhaler    Sig: Inhale 2 puffs into the lungs 2 (two) times daily.    Dispense:  1 each    Refill:  12  . fluticasone-salmeterol (ADVAIR HFA) 230-21 MCG/ACT inhaler    Sig: Inhale 2 puffs into the lungs 2 (two) times daily.    Dispense:  1 each    Refill:  0    Return in about 3 months (around 01/03/2021).  I have spent a total time of 50-minutes on the day of the  appointment reviewing prior documentation, coordinating care and discussing medical diagnosis and plan with the patient/family. Imaging, labs and tests included in this note have been reviewed and interpreted independently by me.  Hurley, MD [facility] 10/05/2020 11:04 AM  Office Number [phone]

## 2020-10-07 ENCOUNTER — Ambulatory Visit: Payer: Self-pay

## 2020-10-07 ENCOUNTER — Encounter: Payer: Self-pay | Admitting: Rheumatology

## 2020-10-07 ENCOUNTER — Ambulatory Visit (INDEPENDENT_AMBULATORY_CARE_PROVIDER_SITE_OTHER): Payer: Medicare Other | Admitting: Rheumatology

## 2020-10-07 ENCOUNTER — Other Ambulatory Visit: Payer: Self-pay

## 2020-10-07 VITALS — BP 149/78 | HR 76 | Resp 17 | Ht 62.25 in | Wt 210.4 lb

## 2020-10-07 DIAGNOSIS — E118 Type 2 diabetes mellitus with unspecified complications: Secondary | ICD-10-CM

## 2020-10-07 DIAGNOSIS — M25551 Pain in right hip: Secondary | ICD-10-CM

## 2020-10-07 DIAGNOSIS — J4540 Moderate persistent asthma, uncomplicated: Secondary | ICD-10-CM

## 2020-10-07 DIAGNOSIS — M25552 Pain in left hip: Secondary | ICD-10-CM | POA: Diagnosis not present

## 2020-10-07 DIAGNOSIS — Z96651 Presence of right artificial knee joint: Secondary | ICD-10-CM | POA: Diagnosis not present

## 2020-10-07 DIAGNOSIS — I10 Essential (primary) hypertension: Secondary | ICD-10-CM

## 2020-10-07 DIAGNOSIS — Z96612 Presence of left artificial shoulder joint: Secondary | ICD-10-CM

## 2020-10-07 DIAGNOSIS — M25572 Pain in left ankle and joints of left foot: Secondary | ICD-10-CM

## 2020-10-07 DIAGNOSIS — M79641 Pain in right hand: Secondary | ICD-10-CM | POA: Diagnosis not present

## 2020-10-07 DIAGNOSIS — M25571 Pain in right ankle and joints of right foot: Secondary | ICD-10-CM

## 2020-10-07 DIAGNOSIS — Z8719 Personal history of other diseases of the digestive system: Secondary | ICD-10-CM

## 2020-10-07 DIAGNOSIS — Z8659 Personal history of other mental and behavioral disorders: Secondary | ICD-10-CM

## 2020-10-07 DIAGNOSIS — L219 Seborrheic dermatitis, unspecified: Secondary | ICD-10-CM

## 2020-10-07 DIAGNOSIS — Z9889 Other specified postprocedural states: Secondary | ICD-10-CM | POA: Diagnosis not present

## 2020-10-07 DIAGNOSIS — M2550 Pain in unspecified joint: Secondary | ICD-10-CM

## 2020-10-07 DIAGNOSIS — R768 Other specified abnormal immunological findings in serum: Secondary | ICD-10-CM | POA: Diagnosis not present

## 2020-10-07 DIAGNOSIS — F320 Major depressive disorder, single episode, mild: Secondary | ICD-10-CM

## 2020-10-07 DIAGNOSIS — M79642 Pain in left hand: Secondary | ICD-10-CM

## 2020-10-07 DIAGNOSIS — L409 Psoriasis, unspecified: Secondary | ICD-10-CM

## 2020-10-07 DIAGNOSIS — G8929 Other chronic pain: Secondary | ICD-10-CM

## 2020-10-10 LAB — URIC ACID: Uric Acid, Serum: 5.9 mg/dL (ref 2.5–7.0)

## 2020-10-10 LAB — CYCLIC CITRUL PEPTIDE ANTIBODY, IGG: Cyclic Citrullin Peptide Ab: <16 UNITS

## 2020-10-10 LAB — CK: Total CK: 95 U/L (ref 29–143)

## 2020-10-16 NOTE — Progress Notes (Signed)
Office Visit Note  Patient: Suzanne Conner             Date of Birth: 03-09-1946           MRN: [id number]             PCP: Billie Ruddy, MD Referring: Billie Ruddy, MD Visit Date: 10/27/2020 Occupation: @GUAROCC @  Subjective:  Pain in multiple joints.   History of Present Illness: KEVYN WENGERT is a 75 y.o. female with history of positive ANA and joint pain.  She denies any history of oral ulcers, nasal ulcers, malar rash,, sicca symptoms, Raynaud's phenomenon, lymphadenopathy or inflammatory arthritis.  She continues to have pain and discomfort in her bilateral hands, bilateral hips, knees and shoulders.  She states she has significant morning stiffness which lasts almost all day.  Activities of Daily Living:  Patient reports morning stiffness for 24 hours.   Patient Reports nocturnal pain.  Difficulty dressing/grooming: Denies Difficulty climbing stairs: Reports Difficulty getting out of chair: Reports Difficulty using hands for taps, buttons, cutlery, and/or writing: Reports  Review of Systems  Constitutional: Positive for fatigue.  HENT: Negative for mouth sores, mouth dryness and nose dryness.   Eyes: Negative for pain, itching, visual disturbance and dryness.  Respiratory: Negative for cough, hemoptysis, shortness of breath and difficulty breathing.   Cardiovascular: Negative for chest pain, palpitations and swelling in legs/feet.  Gastrointestinal: Negative for abdominal pain, blood in stool, constipation and diarrhea.  Endocrine: Negative for increased urination.  Genitourinary: Negative for painful urination.  Musculoskeletal: Positive for arthralgias, joint pain, joint swelling, myalgias, muscle weakness, morning stiffness, muscle tenderness and myalgias.  Skin: Negative for color change, rash and redness.  Allergic/Immunologic: Negative for susceptible to infections.  Neurological: Positive for headaches. Negative for dizziness, numbness, memory loss and  weakness.  Hematological: Negative for swollen glands.  Psychiatric/Behavioral: Negative for confusion and sleep disturbance.    PMFS History:  Patient Active Problem List   Diagnosis Date Noted  . Seborrheic dermatitis 08/11/2020  . Essential hypertension 03/26/2020  . Strabismus 03/26/2020  . Depression, major, single episode, mild ([place]) 03/26/2020  . Chronic pain of multiple joints 03/26/2020  . Anxiety 03/26/2020  . S/p reverse total shoulder arthroplasty 06/12/2018  . Gastroesophageal reflux disease 05/02/2018  . Tear of left rotator cuff 05/02/2018  . Controlled type 2 diabetes mellitus with complication, without long-term current use of insulin ([place]) 05/02/2018  . S/P right rotator cuff repair 05/02/2018    Past Medical History:  Diagnosis Date  . Arthritis    "left hip; right wrist" (06/12/2018)  . Depression   . Family history of polyps in the colon   . GERD (gastroesophageal reflux disease)   . Glaucoma   . Hypertension   . Type 2 diabetes, diet controlled ([place])     Family History  Problem Relation Age of Onset  . Asthma Brother   . Diabetes Brother   . Dementia Mother   . Cancer Father   . Heart disease Sister   . Diabetes Brother   . Diabetes Brother   . Healthy Son   . Healthy Daughter    Past Surgical History:  Procedure Laterality Date  . BUNIONECTOMY Right   . CATARACT EXTRACTION W/ INTRAOCULAR LENS IMPLANT Right   . JOINT REPLACEMENT    . REPLACEMENT TOTAL KNEE Right 2015  . REVERSE SHOULDER ARTHROPLASTY Left 06/12/2018  . REVERSE SHOULDER ARTHROPLASTY Left 06/12/2018   Procedure: LEFT REVERSE SHOULDER ARTHROPLASTY;  Surgeon: Onnie Graham,  Lennette Bihari, MD;  Location: [facility];  Service: Orthopedics;  Laterality: Left;  . SHOULDER ARTHROSCOPY W/ ROTATOR CUFF REPAIR Right 2015  . TUBAL LIGATION     Social History   Social History Narrative  . Not on file   Immunization History  Administered Date(s) Administered  . Fluad Quad(high Dose 65+) 06/22/2020  .  Influenza, High Dose Seasonal PF 05/12/2019  . Influenza-Unspecified 05/29/2018, 05/12/2019  . PFIZER(Purple Top)SARS-COV-2 Vaccination 10/01/2019, 10/22/2019, 06/17/2020  . Pneumococcal Polysaccharide-23 06/13/2018  . Zoster Recombinat (Shingrix) 03/10/2019, 05/12/2019     Objective: Vital Signs: BP 135/74 (BP Location: Left Arm, Patient Position: Sitting, Cuff Size: Normal)   Pulse 78   Ht $R'5\' 5"'Ap$  (1.651 m)   Wt 216 lb 3.2 oz (98.1 kg)   BMI 35.98 kg/m    Physical Exam   Musculoskeletal Exam: C-spine was in good range of motion.  She had painful range of motion of bilateral shoulder joints.  Elbow joints, wrist joints, MCPs PIPs and DIPs with good range of motion.  She had tenderness over bilateral CMC PIPs and DIPs with no synovitis.  She has limited painful range of motion of her hip joints.  She has some discomfort range of motion of her knee joints without any warmth swelling or effusion.  She had no tenderness over MTPs or PIPs.  CDAI Exam: CDAI Score: -- Patient Global: --; Provider Global: -- Swollen: --; Tender: -- Joint Exam 10/27/2020   No joint exam has been documented for this visit   There is currently no information documented on the homunculus. Go to the Rheumatology activity and complete the homunculus joint exam.  Investigation: No additional findings.  Imaging: XR HIPS BILAT W OR W/O PELVIS 3-4 VIEWS  Result Date: 10/07/2020 Bilateral mild superior lateral narrowing was noted.  No SI joint narrowing was noted. Impression: These findings are consistent mild osteoarthritis of bilateral hip joints.  XR Ankle 2 Views Left  Result Date: 10/07/2020 No tibiotalar or subtalar joint space narrowing was noted.  No intertarsal joint space narrowing was noted. Impression: Unremarkable x-ray of the ankle joint.  XR Ankle 2 Views Right  Result Date: 10/07/2020 No tibiotalar narrowing was noted.  Mild subtalar narrowing was noted.  No intertarsal narrowing was noted.  No  erosive changes were noted. Impression: Osteoarthritic changes were noted in the ankle joint.  XR Hand 2 View Left  Result Date: 10/07/2020 PIP and DIP narrowing was noted.  No MCP, intercarpal radiocarpal joint space narrowing was noted.  CMC narrowing was noted.  No erosive changes were noted. Impression: These findings are consistent with osteoarthritis of the hand.  XR Hand 2 View Right  Result Date: 10/07/2020 PIP and DIP narrowing was noted.  No MCP, intercarpal or radiocarpal joint space narrowing was noted.  Some irregularity was noted over radial aspect of the radius.  It could be possible due to previous trauma. Impression: These findings will consistent with osteoarthritis.  Possible traumatic changes were noted on the radius.   Recent Labs: Lab Results  Component Value Date   WBC 7.4 10/05/2020   HGB 13.0 10/05/2020   PLT 298.0 Repeated and verified X2. 10/05/2020   NA 138 10/05/2020   K 3.9 10/05/2020   CL 100 10/05/2020   CO2 30 10/05/2020   GLUCOSE 92 10/05/2020   BUN 16 10/05/2020   CREATININE 0.71 10/05/2020   BILITOT 0.6 10/05/2020   ALKPHOS 66 10/05/2020   AST 18 10/05/2020   ALT 17 10/05/2020   PROT 7.7  10/05/2020   ALBUMIN 4.1 10/05/2020   CALCIUM 9.9 10/05/2020   GFRAA >60 05/30/2018   October 07, 2020 CK 95, uric acid 5.9, anti-CCP <16  04/20/20: ANA 1:80NH, 1:80cytoplasmic, CRP 19.7, ESR 29, RF<14, Scl-70-, Ro-, La-, SM/RNP-, dsDNA<1, complements WNL, [place]-, thyroperoxidase ab 1.  Speciality Comments: No specialty comments available.  Procedures:  No procedures performed Allergies: Penicillins and Paxil [paroxetine]   Assessment / Plan:     Visit Diagnoses: Positive ANA (antinuclear antibody) - Patient had low titer ANA.  All other autoimmune work-up was negative.  She had no clinical features of autoimmune disease.  There is no history of oral ulcers, nasal ulcers, sicca symptoms, malar rash, photosensitivity or Raynaud's phenomenon.  I have  advised her to contact me in case she develops any new symptoms.  Status post reverse total replacement of left shoulder - October 2019 by Dr. Onnie Graham, doing well.  She continues to have discomfort with range of motion of her shoulders.  S/P right rotator cuff repair-chronic pain  Primary osteoarthritis of both hands - Clinical and radiographic findings are consistent with osteoarthritis.  All autoimmune work-up was negative.  X-ray findings were discussed with the patient.  Joint protection muscle strengthening was discussed.  Have given her a handout on hand exercises.  Primary osteoarthritis of both hips - Mild arthritic changes were noted on the x-rays.  Weight loss diet and exercise was discussed.  Handout on hip joint exercises were discussed.  Status post total right knee replacement-she has chronic pain with no synovitis.  Chronic pain of both ankles-she had no tenderness or synovitis on examination.  Moderate persistent reactive airway disease with wheezing without complication  Essential hypertension  Controlled type 2 diabetes mellitus with complication, without long-term current use of insulin (HCC)  History of gastroesophageal reflux (GERD)  Depression, major, single episode, mild (HCC)  History of anxiety  Seborrheic dermatitis  Orders: No orders of the defined types were placed in this encounter.  No orders of the defined types were placed in this encounter.    Follow-Up Instructions: Return if symptoms worsen or fail to improve, for Osteoarthritis.   Bo Merino, MD  Note - This record has been created using Editor, commissioning.  Chart creation errors have been sought, but may not always  have been located. Such creation errors do not reflect on  the standard of medical care.

## 2020-10-20 ENCOUNTER — Telehealth: Payer: Self-pay

## 2020-10-20 NOTE — Telephone Encounter (Signed)
Pt called the office today state that her Dentist office faxed over a clearance form for her dental procedure, advised pt that our office has not received any form regarding dental clearance, requested to contact her dental office for the form. Called pt dental office provided our fax number, requested to fax the form to our office for completing.

## 2020-10-24 ENCOUNTER — Telehealth: Payer: Self-pay

## 2020-10-24 ENCOUNTER — Telehealth: Payer: Self-pay | Admitting: Family Medicine

## 2020-10-24 ENCOUNTER — Other Ambulatory Visit: Payer: Self-pay

## 2020-10-24 DIAGNOSIS — I10 Essential (primary) hypertension: Secondary | ICD-10-CM

## 2020-10-24 MED ORDER — HYDROCHLOROTHIAZIDE 12.5 MG PO TABS: 12.5000 mg | ORAL_TABLET | Freq: Every day | ORAL | 0 refills | Status: DC

## 2020-10-24 MED ORDER — METOPROLOL TARTRATE 25 MG PO TABS: 25.0000 mg | ORAL_TABLET | Freq: Two times a day (BID) | ORAL | 0 refills | Status: DC

## 2020-10-24 NOTE — Telephone Encounter (Signed)
Per chart review, phone note from 2/10 pt called about form that needed completing prior to dental procedure.  This provider has yet to receive any form regarding this.  Also pt has h/o allergy to penicillin causing hives.  No records of pt being given amoxicillin per chart review.

## 2020-10-24 NOTE — Telephone Encounter (Signed)
Pt requests sent to Dr Volanda Napoleon for advise

## 2020-10-24 NOTE — Telephone Encounter (Signed)
Patient came in and states that she has been waiting for a call back from [place] regarding clearance forms.  Patient was advised that Izora Gala has been in touch with her dental office and she has requested that they fax them to [place].  Please advise.

## 2020-10-25 NOTE — Telephone Encounter (Signed)
This message has already been resolved

## 2020-10-25 NOTE — Telephone Encounter (Signed)
Spoke with pt advised to schedule appointment regarding request for Antibiotic for dental work, pt state that she called her dentist office requested them to fax a form for the dental procedure to our office. Awaiting on the form to be faxed. Pt is scheduled for 10/27/2020 at 11.30 am

## 2020-10-27 ENCOUNTER — Encounter: Payer: Self-pay | Admitting: Rheumatology

## 2020-10-27 ENCOUNTER — Telehealth: Payer: Medicare Other | Admitting: Family Medicine

## 2020-10-27 ENCOUNTER — Ambulatory Visit (INDEPENDENT_AMBULATORY_CARE_PROVIDER_SITE_OTHER): Payer: Medicare Other | Admitting: Rheumatology

## 2020-10-27 ENCOUNTER — Other Ambulatory Visit: Payer: Self-pay

## 2020-10-27 VITALS — BP 135/74 | HR 78 | Ht 65.0 in | Wt 216.2 lb

## 2020-10-27 DIAGNOSIS — R768 Other specified abnormal immunological findings in serum: Secondary | ICD-10-CM | POA: Diagnosis not present

## 2020-10-27 DIAGNOSIS — F320 Major depressive disorder, single episode, mild: Secondary | ICD-10-CM

## 2020-10-27 DIAGNOSIS — Z8659 Personal history of other mental and behavioral disorders: Secondary | ICD-10-CM

## 2020-10-27 DIAGNOSIS — M160 Bilateral primary osteoarthritis of hip: Secondary | ICD-10-CM

## 2020-10-27 DIAGNOSIS — J4540 Moderate persistent asthma, uncomplicated: Secondary | ICD-10-CM

## 2020-10-27 DIAGNOSIS — Z96651 Presence of right artificial knee joint: Secondary | ICD-10-CM

## 2020-10-27 DIAGNOSIS — Z9889 Other specified postprocedural states: Secondary | ICD-10-CM | POA: Diagnosis not present

## 2020-10-27 DIAGNOSIS — I10 Essential (primary) hypertension: Secondary | ICD-10-CM | POA: Diagnosis not present

## 2020-10-27 DIAGNOSIS — M19042 Primary osteoarthritis, left hand: Secondary | ICD-10-CM

## 2020-10-27 DIAGNOSIS — L219 Seborrheic dermatitis, unspecified: Secondary | ICD-10-CM

## 2020-10-27 DIAGNOSIS — M19041 Primary osteoarthritis, right hand: Secondary | ICD-10-CM

## 2020-10-27 DIAGNOSIS — G8929 Other chronic pain: Secondary | ICD-10-CM

## 2020-10-27 DIAGNOSIS — E118 Type 2 diabetes mellitus with unspecified complications: Secondary | ICD-10-CM | POA: Diagnosis not present

## 2020-10-27 DIAGNOSIS — Z96612 Presence of left artificial shoulder joint: Secondary | ICD-10-CM

## 2020-10-27 DIAGNOSIS — Z8719 Personal history of other diseases of the digestive system: Secondary | ICD-10-CM | POA: Diagnosis not present

## 2020-10-27 DIAGNOSIS — M25572 Pain in left ankle and joints of left foot: Secondary | ICD-10-CM

## 2020-10-27 DIAGNOSIS — M25571 Pain in right ankle and joints of right foot: Secondary | ICD-10-CM | POA: Diagnosis not present

## 2020-10-27 NOTE — Patient Instructions (Signed)
Hip Exercises Ask your health care provider which exercises are safe for you. Do exercises exactly as told by your health care provider and adjust them as directed. It is normal to feel mild stretching, pulling, tightness, or discomfort as you do these exercises. Stop right away if you feel sudden pain or your pain gets worse. Do not begin these exercises until told by your health care provider. Stretching and range-of-motion exercises These exercises warm up your muscles and joints and improve the movement and flexibility of your hip. These exercises also help to relieve pain, numbness, and tingling. You may be asked to limit your range of motion if you had a hip replacement. Talk to your health care provider about these restrictions. Hamstrings, supine 1. Lie on your back (supine position). 2. Loop a belt or towel over the ball of your left / right foot. The ball of your foot is on the walking surface, right under your toes. 3. Straighten your left / right knee and slowly pull on the belt or towel to raise your leg until you feel a gentle stretch behind your knee (hamstring). ? Do not let your knee bend while you do this. ? Keep your other leg flat on the floor. 4. Hold this position for __________ seconds. 5. Slowly return your leg to the starting position. Repeat __________ times. Complete this exercise __________ times a day.   Hip rotation 1. Lie on your back on a firm surface. 2. With your left / right hand, gently pull your left / right knee toward the shoulder that is on the same side of the body. Stop when your knee is pointing toward the ceiling. 3. Hold your left / right ankle with your other hand. 4. Keeping your knee steady, gently pull your left / right ankle toward your other shoulder until you feel a stretch in your buttocks. ? Keep your hips and shoulders firmly planted while you do this stretch. 5. Hold this position for __________ seconds. Repeat __________ times. Complete  this exercise __________ times a day.   Seated stretch This exercise is sometimes called hamstrings and adductors stretch. 1. Sit on the floor with your legs stretched wide. Keep your knees straight during this exercise. 2. Keeping your head and back in a straight line, bend at your waist to reach for your left foot (position A). You should feel a stretch in your right inner thigh (adductors). 3. Hold this position for __________ seconds. Then slowly return to the upright position. 4. Keeping your head and back in a straight line, bend at your waist to reach forward (position B). You should feel a stretch behind both of your thighs and knees (hamstrings). 5. Hold this position for __________ seconds. Then slowly return to the upright position. 6. Keeping your head and back in a straight line, bend at your waist to reach for your right foot (position C). You should feel a stretch in your left inner thigh (adductors). 7. Hold this position for __________ seconds. Then slowly return to the upright position. Repeat __________ times. Complete this exercise __________ times a day.   Lunge This exercise stretches the muscles of the hip (hip flexors). 1. Place your left / right knee on the floor and bend your other knee so that is directly over your ankle. You should be half-kneeling. 2. Keep good posture with your head over your shoulders. 3. Tighten your buttocks to point your tailbone downward. This will prevent your back from arching too much. 4.  You should feel a gentle stretch in the front of your left / right thigh and hip. If you do not feel a stretch, slide your other foot forward slightly and then slowly lunge forward with your chest up until your knee once again lines up over your ankle. ? Make sure your tailbone continues to point downward. 5. Hold this position for __________ seconds. 6. Slowly return to the starting position. Repeat __________ times. Complete this exercise __________ times  a day.   Strengthening exercises These exercises build strength and endurance in your hip. Endurance is the ability to use your muscles for a long time, even after they get tired. Bridge This exercise strengthens the muscles of your hip (hip extensors). 1. Lie on your back on a firm surface with your knees bent and your feet flat on the floor. 2. Tighten your buttocks muscles and lift your bottom off the floor until the trunk of your body and your hips are level with your thighs. ? Do not arch your back. ? You should feel the muscles working in your buttocks and the back of your thighs. If you do not feel these muscles, slide your feet 1-2 inches (2.5-5 cm) farther away from your buttocks. 3. Hold this position for __________ seconds. 4. Slowly lower your hips to the starting position. 5. Let your muscles relax completely between repetitions. Repeat __________ times. Complete this exercise __________ times a day.   Straight leg raises, side-lying This exercise strengthens the muscles that move the hip joint away from the center of the body (hip abductors). 1. Lie on your side with your left / right leg in the top position. Lie so your head, shoulder, hip, and knee line up. You may bend your bottom knee slightly to help you balance. 2. Roll your hips slightly forward, so your hips are stacked directly over each other and your left / right knee is facing forward. 3. Leading with your heel, lift your top leg 4-6 inches (10-15 cm). You should feel the muscles in your top hip lifting. ? Do not let your foot drift forward. ? Do not let your knee roll toward the ceiling. 4. Hold this position for __________ seconds. 5. Slowly return to the starting position. 6. Let your muscles relax completely between repetitions. Repeat __________ times. Complete this exercise __________ times a day.   Straight leg raises, side-lying This exercise strengthens the muscles that move the hip joint toward the center  of the body (hip adductors). 1. Lie on your side with your left / right leg in the bottom position. Lie so your head, shoulder, hip, and knee line up. You may place your upper foot in front to help you balance. 2. Roll your hips slightly forward, so your hips are stacked directly over each other and your left / right knee is facing forward. 3. Tense the muscles in your inner thigh and lift your bottom leg 4-6 inches (10-15 cm). 4. Hold this position for __________ seconds. 5. Slowly return to the starting position. 6. Let your muscles relax completely between repetitions. Repeat __________ times. Complete this exercise __________ times a day.   Straight leg raises, supine This exercise strengthens the muscles in the front of your thigh (quadriceps). 1. Lie on your back (supine position) with your left / right leg extended and your other knee bent. 2. Tense the muscles in the front of your left / right thigh. You should see your kneecap slide up or see increased dimpling just  above your knee. 3. Keep these muscles tight as you raise your leg 4-6 inches (10-15 cm) off the floor. Do not let your knee bend. 4. Hold this position for __________ seconds. 5. Keep these muscles tense as you lower your leg. 6. Relax the muscles slowly and completely between repetitions. Repeat __________ times. Complete this exercise __________ times a day.   Hip abductors, standing This exercise strengthens the muscles that move the leg and hip joint away from the center of the body (hip abductors). 1. Tie one end of a rubber exercise band or tubing to a secure surface, such as a chair, table, or pole. 2. Loop the other end of the band or tubing around your left / right ankle. 3. Keeping your ankle with the band or tubing directly opposite the secured end, step away until there is tension in the tubing or band. Hold on to a chair, table, or pole as needed for balance. 4. Lift your left / right leg out to your side.  While you do this: ? Keep your back upright. ? Keep your shoulders over your hips. ? Keep your toes pointing forward. ? Make sure to use your hip muscles to slowly lift your leg. Do not tip your body or forcefully lift your leg. 5. Hold this position for __________ seconds. 6. Slowly return to the starting position. Repeat __________ times. Complete this exercise __________ times a day. Squats This exercise strengthens the muscles in the front of your thigh (quadriceps). 1. Stand in a door frame so your feet and knees are in line with the frame. You may place your hands on the frame for balance. 2. Slowly bend your knees and lower your hips like you are going to sit in a chair. ? Keep your lower legs in a straight-up-and-down position. ? Do not let your hips go lower than your knees. ? Do not bend your knees lower than told by your health care provider. ? If your hip pain increases, do not bend as low. 3. Hold this position for ___________ seconds. 4. Slowly push with your legs to return to standing. Do not use your hands to pull yourself to standing. Repeat __________ times. Complete this exercise __________ times a day. This information is not intended to replace advice given to you by your health care provider. Make sure you discuss any questions you have with your health care provider. Document Revised: 04/02/2019 Document Reviewed: 07/08/2018 [employer] Patient Education  2021 [place]. Hand Exercises Hand exercises can be helpful for almost anyone. These exercises can strengthen the hands, improve flexibility and movement, and increase blood flow to the hands. These results can make work and daily tasks easier. Hand exercises can be especially helpful for people who have joint pain from arthritis or have nerve damage from overuse (carpal tunnel syndrome). These exercises can also help people who have injured a hand. Exercises Most of these hand exercises are gentle stretching and  motion exercises. It is usually safe to do them often throughout the day. Warming up your hands before exercise may help to reduce stiffness. You can do this with gentle massage or by placing your hands in warm water for 10-15 minutes. It is normal to feel some stretching, pulling, tightness, or mild discomfort as you begin new exercises. This will gradually improve. Stop an exercise right away if you feel sudden, severe pain or your pain gets worse. Ask your health care provider which exercises are best for you. Knuckle bend or "  claw" fist 1. Stand or sit with your arm, hand, and all five fingers pointed straight up. Make sure to keep your wrist straight during the exercise. 2. Gently bend your fingers down toward your palm until the tips of your fingers are touching the top of your palm. Keep your big knuckle straight and just bend the small knuckles in your fingers. 3. Hold this position for __________ seconds. 4. Straighten (extend) your fingers back to the starting position. Repeat this exercise 5-10 times with each hand. Full finger fist 1. Stand or sit with your arm, hand, and all five fingers pointed straight up. Make sure to keep your wrist straight during the exercise. 2. Gently bend your fingers into your palm until the tips of your fingers are touching the middle of your palm. 3. Hold this position for __________ seconds. 4. Extend your fingers back to the starting position, stretching every joint fully. Repeat this exercise 5-10 times with each hand. Straight fist 1. Stand or sit with your arm, hand, and all five fingers pointed straight up. Make sure to keep your wrist straight during the exercise. 2. Gently bend your fingers at the big knuckle, where your fingers meet your hand, and the middle knuckle. Keep the knuckle at the tips of your fingers straight and try to touch the bottom of your palm. 3. Hold this position for __________ seconds. 4. Extend your fingers back to the  starting position, stretching every joint fully. Repeat this exercise 5-10 times with each hand. Tabletop 1. Stand or sit with your arm, hand, and all five fingers pointed straight up. Make sure to keep your wrist straight during the exercise. 2. Gently bend your fingers at the big knuckle, where your fingers meet your hand, as far down as you can while keeping the small knuckles in your fingers straight. Think of forming a tabletop with your fingers. 3. Hold this position for __________ seconds. 4. Extend your fingers back to the starting position, stretching every joint fully. Repeat this exercise 5-10 times with each hand. Finger spread 1. Place your hand flat on a table with your palm facing down. Make sure your wrist stays straight as you do this exercise. 2. Spread your fingers and thumb apart from each other as far as you can until you feel a gentle stretch. Hold this position for __________ seconds. 3. Bring your fingers and thumb tight together again. Hold this position for __________ seconds. Repeat this exercise 5-10 times with each hand. Making circles 1. Stand or sit with your arm, hand, and all five fingers pointed straight up. Make sure to keep your wrist straight during the exercise. 2. Make a circle by touching the tip of your thumb to the tip of your index finger. 3. Hold for __________ seconds. Then open your hand wide. 4. Repeat this motion with your thumb and each finger on your hand. Repeat this exercise 5-10 times with each hand. Thumb motion 1. Sit with your forearm resting on a table and your wrist straight. Your thumb should be facing up toward the ceiling. Keep your fingers relaxed as you move your thumb. 2. Lift your thumb up as high as you can toward the ceiling. Hold for __________ seconds. 3. Bend your thumb across your palm as far as you can, reaching the tip of your thumb for the small finger (pinkie) side of your palm. Hold for __________ seconds. Repeat this  exercise 5-10 times with each hand. Grip strengthening 1. Hold a stress ball  or other soft ball in the middle of your hand. 2. Slowly increase the pressure, squeezing the ball as much as you can without causing pain. Think of bringing the tips of your fingers into the middle of your palm. All of your finger joints should bend when doing this exercise. 3. Hold your squeeze for __________ seconds, then relax. Repeat this exercise 5-10 times with each hand.   Contact a health care provider if:  Your hand pain or discomfort gets much worse when you do an exercise.  Your hand pain or discomfort does not improve within 2 hours after you exercise. If you have any of these problems, stop doing these exercises right away. Do not do them again unless your health care provider says that you can. Get help right away if:  You develop sudden, severe hand pain or swelling. If this happens, stop doing these exercises right away. Do not do them again unless your health care provider says that you can. This information is not intended to replace advice given to you by your health care provider. Make sure you discuss any questions you have with your health care provider. Document Revised: 12/18/2018 Document Reviewed: 08/28/2018 Else[employer] Patient Education  2021 [employer].

## 2020-12-08 DIAGNOSIS — Z23 Encounter for immunization: Secondary | ICD-10-CM | POA: Diagnosis not present

## 2021-01-02 ENCOUNTER — Other Ambulatory Visit (HOSPITAL_COMMUNITY)
Admission: RE | Admit: 2021-01-02 | Discharge: 2021-01-02 | Disposition: A | Discharge status: Home / Self Care | Payer: Medicare Other | Source: Ambulatory Visit | Attending: Pulmonary Disease | Admitting: Pulmonary Disease

## 2021-01-02 DIAGNOSIS — Z20822 Contact with and (suspected) exposure to covid-19: Secondary | ICD-10-CM | POA: Insufficient documentation

## 2021-01-02 DIAGNOSIS — Z01812 Encounter for preprocedural laboratory examination: Secondary | ICD-10-CM | POA: Insufficient documentation

## 2021-01-03 LAB — SARS CORONAVIRUS 2 (TAT 6-24 HRS): SARS Coronavirus 2: NEGATIVE

## 2021-01-04 ENCOUNTER — Other Ambulatory Visit: Payer: Self-pay

## 2021-01-04 ENCOUNTER — Encounter: Payer: Self-pay | Admitting: Pulmonary Disease

## 2021-01-04 ENCOUNTER — Ambulatory Visit (INDEPENDENT_AMBULATORY_CARE_PROVIDER_SITE_OTHER): Payer: Medicare Other | Admitting: Pulmonary Disease

## 2021-01-04 ENCOUNTER — Other Ambulatory Visit: Payer: Self-pay | Admitting: Family Medicine

## 2021-01-04 DIAGNOSIS — R0602 Shortness of breath: Secondary | ICD-10-CM

## 2021-01-04 DIAGNOSIS — D860 Sarcoidosis of lung: Secondary | ICD-10-CM | POA: Diagnosis not present

## 2021-01-04 DIAGNOSIS — I10 Essential (primary) hypertension: Secondary | ICD-10-CM

## 2021-01-04 DIAGNOSIS — J4540 Moderate persistent asthma, uncomplicated: Secondary | ICD-10-CM

## 2021-01-04 DIAGNOSIS — J4520 Mild intermittent asthma, uncomplicated: Secondary | ICD-10-CM

## 2021-01-04 LAB — PULMONARY FUNCTION TEST
DL/VA % pred: 109 %
DL/VA: 4.48 ml/min/mmHg/L
DLCO cor % pred: 62 %
DLCO cor: 12.54 ml/min/mmHg
DLCO unc % pred: 62 %
DLCO unc: 12.54 ml/min/mmHg
FEF 25-75 Post: 0.99 L/sec
FEF 25-75 Pre: 0.69 L/sec
FEF2575-%Change-Post: 44 %
FEF2575-%Pred-Post: 60 %
FEF2575-%Pred-Pre: 41 %
FEV1-%Change-Post: 10 %
FEV1-%Pred-Post: 59 %
FEV1-%Pred-Pre: 53 %
FEV1-Post: 1.1 L
FEV1-Pre: 0.99 L
FEV1FVC-%Change-Post: 1 %
FEV1FVC-%Pred-Pre: 95 %
FEV6-%Change-Post: 9 %
FEV6-%Pred-Post: 64 %
FEV6-%Pred-Pre: 59 %
FEV6-Post: 1.49 L
FEV6-Pre: 1.36 L
FEV6FVC-%Pred-Post: 104 %
FEV6FVC-%Pred-Pre: 104 %
FVC-%Change-Post: 9 %
FVC-%Pred-Post: 62 %
FVC-%Pred-Pre: 57 %
FVC-Post: 1.49 L
FVC-Pre: 1.36 L
Post FEV1/FVC ratio: 74 %
Post FEV6/FVC ratio: 100 %
Pre FEV1/FVC ratio: 73 %
Pre FEV6/FVC Ratio: 100 %
RV % pred: 79 %
RV: 1.82 L
TLC % pred: 63 %
TLC: 3.31 L

## 2021-01-04 MED ORDER — ADVAIR HFA 230-21 MCG/ACT IN AERO: 2.0000 | INHALATION_SPRAY | Freq: Two times a day (BID) | RESPIRATORY_TRACT | 3 refills | Status: DC

## 2021-01-04 MED ORDER — ALBUTEROL SULFATE HFA 108 (90 BASE) MCG/ACT IN AERS
2.0000 | INHALATION_SPRAY | Freq: Four times a day (QID) | RESPIRATORY_TRACT | 2 refills | Status: DC | PRN
Start: 2021-01-04 — End: 2021-03-21

## 2021-01-04 NOTE — Patient Instructions (Addendum)
Mild/moderatere persistent asthma - CONTINUE Advair 230-21 mcg TWO puffs TWICE a day. REFILL via mail order (3 month supply) - CONTINUE Albuterol TWO puffs as needed for shortness of breath or wheezing. REFILL via mail order (3 month supply)  Asthma action plan Increase Advair 2 puffs three times a day for worsening shortness of breath, wheezing and cough. If you symptoms do not improve in 24-48 hours, please our office for evaluation and/or prednisone taper.  Pulmonary sarcoidosis - Recommend annual ophthalmology exam. Please mention at your next visit that you have history of sarcoid. Scheduled in 02/2021 - Reviewed EKG. Prolonged P-R interval - Refer to Pulmonary Rehab - Plan for repeat PFTs in 6-12 months pending clinical improvement (06/2021)  Follow-up in 3 months with me

## 2021-01-04 NOTE — Progress Notes (Signed)
Subjective:   PATIENT ID: Valeda Malm GENDER: female DOB: April 06, 1946, MRN: [id number]   HPI  Chief Complaint  Patient presents with  . Follow-up    Get pft results.  Wheeze and sob with the inhaler has not had a major attack.    Reason for Visit: Follow-up  Ms. Ziyon Cedotal is a 75 year old female never smoker with childhood asthma, diabetes (diet controlled), hypertension and GERD who presents as a referral from her PCP for shortness of breath and wheezing.  Synopsis: She was previously seen by a Pulmonary doctor in [place] 22 years ago for sarcoid diagnosed via bronchoscopy. She is unable to recall any prolonged treatment. Has not had any respiratory issues since then. Was only seen for ~1 year and reports CXR improved during that time. Denies skin involvement except for seborrheic keratosis. Sees an ophthalmologist for high optic pressures. Two months prior to our consultation, she reports shortness of breath and wheezing. She was treated with prednisone and albuterol at an urgent care. Still had persistent wheezing especially worse at night and activity. She lives in a two story home and now having difficulty. Denies cough, chest pain. Compliant with her Advair which she takes two puffs BID. Still has breakthrough wheezing. Does not use albuterol. No longer has nocturnal awakenings since starting Advair  01/04/21 Since our last visit, we increased her Advair to 230-21 mcg. She reports minimal wheezing at night which is improved. Still has shortness of breath. She tries not to exert herself because she gets short of breath. Walking around the store can be difficult. Since the pandemic, she has been less active and has had 15lb weight gain during the time. She is wanting to be more active including [facility] and doing water aerobics.  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  Past Medical History:  Diagnosis Date  . Arthritis    "left hip; right wrist" (06/12/2018)  . Depression   . Family history of polyps in the colon   . GERD (gastroesophageal reflux disease)   . Glaucoma   . Hypertension   . Type 2 diabetes, diet controlled (HCC)      Allergies  Allergen Reactions  . Penicillins Hives and Other (See Comments)    PATIENT HAS HAD A PCN REACTION WITH IMMEDIATE RASH, FACIAL/TONGUE/THROAT SWELLING, SOB, OR LIGHTHEADEDNESS WITH HYPOTENSION:  #  #  YES  #  #  INCLUDING HIVES AS A CHILD Has patient had a PCN reaction causing severe rash involving mucus membranes or skin necrosis: No Has patient had a PCN reaction that required hospitalization: No Has patient had a PCN reaction occurring within the last 10 years: No If all of the above answers are "NO", then may proceed with Cephalosporin use. PATIENT HAS HAD A PCN REACTION WITH IMMEDIATE RASH, FACIAL/TONGUE/THROAT SWELLING, SOB, OR LIGHTHEADEDNESS WITH HYPOTENSION: # # YES # # INCLUDING HIVES AS A CHILD Has patient had a PCN reaction causing severe rash involving mucus membranes or skin necrosis: No Has patient had a PCN reaction that required hospitalization: No Has patient had a PCN reaction occurring within the last 10 years: No If all of the above answers are "NO", then may proceed with Cephalosporin use.   Marland Kitchen Paxil [Paroxetine]     Paxil 20 mg caused a tremor.     Outpatient Medications Prior to Visit  Medication Sig Dispense Refill  .  albuterol (VENTOLIN HFA) 108 (90 Base) MCG/ACT inhaler Inhale 2 puffs into the lungs every 6 (six) hours as needed for wheezing or shortness of breath. 8 g 2  . aspirin EC 81 MG tablet Take 81 mg by mouth daily.    . Cholecalciferol (VITAMIN D) 2000 units CAPS Take 2,000 Units by mouth daily.    . clindamycin (CLEOCIN) 300 MG capsule Take 300 mg by mouth 2 (two) times daily. Prior to dental appointments     . Clobetasol Propionate 0.05 % shampoo Use a small amount on the affected areas twice a day as needed. 118 mL 0  . fluticasone-salmeterol (ADVAIR HFA) 230-21 MCG/ACT inhaler Inhale 2 puffs into the lungs 2 (two) times daily. 1 each 12  . hydrochlorothiazide (HYDRODIURIL) 12.5 MG tablet TAKE 1 TABLET DAILY 90 tablet 3  . hydrocortisone valerate cream (WESTCORT) 0.2 % Apply 1 application topically 2 (two) times daily. 45 g 0  . latanoprost (XALATAN) 0.005 % ophthalmic solution Apply to eye.    . metoprolol tartrate (LOPRESSOR) 25 MG tablet TAKE 1 TABLET TWICE A DAY 180 tablet 3  . Multiple Vitamins-Minerals (CENTRUM SILVER PO) Take 1 tablet by mouth daily.     Marland Kitchen omeprazole (PRILOSEC) 40 MG capsule TAKE 1 CAPSULE TWICE A DAY 180 capsule 3  . Probiotic Product (TRUNATURE DIGESTIVE PROBIOTIC) CAPS Take 1 capsule by mouth daily.      No facility-administered medications prior to visit.    Review of Systems  Constitutional: Negative for chills, diaphoresis, fever, malaise/fatigue and weight loss.  HENT: Negative for congestion.   Respiratory: Positive for shortness of breath and wheezing. Negative for cough, hemoptysis and sputum production.   Cardiovascular: Negative for chest pain, palpitations and leg swelling.     Objective:   Vitals:   01/04/21 1415  BP: 140/68  Pulse: 98  Temp: 98.2 F (36.8 C)  TempSrc: Temporal  SpO2: 98%  Weight: 208 lb 12.8 oz (94.7 kg)  Height: 5\' 2"  (1.575 m)   SpO2: 98 % O2 Device: None (Room air)  Physical Exam: General: Well-appearing, no acute distress HENT: [place], AT Eyes: EOMI, no scleral icterus Respiratory: Clear to auscultation bilaterally.  No crackles, wheezing or rales Cardiovascular: RRR, -M/R/G, no JVD Extremities:-Edema,-tenderness Neuro: AAO x4, CNII-XII grossly intact Skin: Intact, no rashes or bruising Psych: Normal mood, normal affect  Data Reviewed:  Imaging: CXR 07/12/2020-no infiltrate, effusion or  edema.  PFT: 01/04/21 FVC 1.49 (62%) FEV1 1.10 (59%) Ratio 73  TLC 63% DLCO 62% Interpretation: Mixed obstructive and restrictive defect with mildly reduced DLCO. No significant bronchodilator response however does not preclude benefit of bronchodilators  Labs: CBC    Component Value Date/Time   WBC 7.4 10/05/2020 1141   RBC 4.60 10/05/2020 1141   HGB 13.0 10/05/2020 1141   HCT 38.8 10/05/2020 1141   PLT 298.0 Repeated and verified X2. 10/05/2020 1141   MCV 84.4 10/05/2020 1141   MCH 27.7 03/18/2020 1135   MCHC 33.5 10/05/2020 1141   RDW 14.0 10/05/2020 1141   LYMPHSABS 2,025 03/18/2020 1135   EOSABS 70 03/18/2020 1135   BASOSABS 32 03/18/2020 1135  Absolute eos-70  Imaging, labs and test noted above have been reviewed independently by me.    Assessment & Plan:   Discussion: 75 year old with history of childhood asthma and pulmonary sarcoid who presents for follow-up. Based on her history of sarcoid management and normal CXR, she likely had Lofgren syndrome. With her current symptoms I suspect she has asthma rather  than reactivation of her sarcoid. We reviewed her PFTs which demonstrated mixed defect likely related to asthma in setting of sarcoid. Symptoms improved on higher dose of ICS/LABA.  Mild/moderatere persistent asthma - CONTINUE Advair 230-21 mcg TWO puffs TWICE a day. REFILL via mail order (3 month supply) - CONTINUE Albuterol TWO puffs as needed for shortness of breath or wheezing. REFILL via mail order (3 month supply)  Asthma action plan Increase Advair 2 puffs three times a day for worsening shortness of breath, wheezing and cough. If you symptoms do not improve in 24-48 hours, please our office for evaluation and/or prednisone taper.  Pulmonary sarcoidosis - Dx in 1999 via endobronchial bx. No records available - No indication for prednisone therapy - Annual PFTs. Due 12/2021 - Recommend annual ophthalmology exam. Last visit on Dec 2021. Currently scheduled  in 02/2021 - Reviewed EKG. Prolonged P-R interval - Refer to Pulmonary Rehab - Plan for repeat PFTs in 6-12 months pending clinical improvement (06/2021)   Health Maintenance Immunization History  Administered Date(s) Administered  . Fluad Quad(high Dose 65+) 06/22/2020  . Influenza, High Dose Seasonal PF 05/12/2019  . Influenza-Unspecified 05/29/2018, 05/12/2019  . PFIZER(Purple Top)SARS-COV-2 Vaccination 10/01/2019, 10/22/2019, 06/17/2020  . Pneumococcal Polysaccharide-23 06/13/2018  . Zoster Recombinat (Shingrix) 03/10/2019, 05/12/2019   CT Lung Screen - not indicated  Orders Placed This Encounter  Procedures  . AMB referral to pulmonary rehabilitation    Referral Priority:   Routine    Referral Type:   Consultation    Number of Visits Requested:   1   Meds ordered this encounter  Medications  . fluticasone-salmeterol (ADVAIR HFA) 230-21 MCG/ACT inhaler    Sig: Inhale 2 puffs into the lungs 2 (two) times daily.    Dispense:  3 each    Refill:  3    Provide 3 month refill at a time.  Marland Kitchen albuterol (VENTOLIN HFA) 108 (90 Base) MCG/ACT inhaler    Sig: Inhale 2 puffs into the lungs every 6 (six) hours as needed for wheezing or shortness of breath.    Dispense:  3 each    Refill:  2    Provide 3 month supply.    Return in about 3 months (around 04/05/2021).  I have spent a total time of 33-minutes on the day of the appointment reviewing prior documentation, coordinating care and discussing medical diagnosis and plan with the patient/family. Imaging, labs and tests included in this note have been reviewed and interpreted independently by me.  Aija Scarfo Rodman Pickle, MD [facility] 01/04/2021 2:24 PM  Office Number [phone]

## 2021-01-04 NOTE — Progress Notes (Signed)
Full PFT completed today ? ?

## 2021-01-11 ENCOUNTER — Telehealth (HOSPITAL_COMMUNITY): Payer: Self-pay | Admitting: Family Medicine

## 2021-01-11 ENCOUNTER — Telehealth (HOSPITAL_COMMUNITY): Payer: Self-pay

## 2021-01-11 NOTE — Telephone Encounter (Signed)
Pt insurance is active and benefits verified through Medicare A/B. Co-pay $0.00, DED $233.00/$233.00 met, out of pocket $0.00/$0.00 met, co-insurance 20%. No pre-authorization required. 01/11/21 @ 336PM  Will contact patient to see if she is interested in the Pulmonary Rehab Program.

## 2021-01-11 NOTE — Telephone Encounter (Addendum)
Pt called and stated that she wanted to schedule for pulmonary rehab. Advised pt that we have a backlog and will be contacted to schedule once we get to her file. Pt understood.

## 2021-02-01 DIAGNOSIS — J4520 Mild intermittent asthma, uncomplicated: Secondary | ICD-10-CM | POA: Insufficient documentation

## 2021-02-13 ENCOUNTER — Encounter: Payer: Self-pay | Admitting: Family Medicine

## 2021-02-13 DIAGNOSIS — Z961 Presence of intraocular lens: Secondary | ICD-10-CM | POA: Diagnosis not present

## 2021-02-13 DIAGNOSIS — H25012 Cortical age-related cataract, left eye: Secondary | ICD-10-CM | POA: Diagnosis not present

## 2021-02-13 DIAGNOSIS — H401132 Primary open-angle glaucoma, bilateral, moderate stage: Secondary | ICD-10-CM | POA: Diagnosis not present

## 2021-02-13 DIAGNOSIS — H2512 Age-related nuclear cataract, left eye: Secondary | ICD-10-CM | POA: Diagnosis not present

## 2021-02-13 LAB — HM DIABETES EYE EXAM

## 2021-03-02 ENCOUNTER — Telehealth (HOSPITAL_COMMUNITY): Payer: Self-pay

## 2021-03-02 NOTE — Telephone Encounter (Signed)
Called patient to see if she was interested in participating in the Pulmonary Rehab Program. Patient stated yes. Patient will come in for orientation on 03/03/21 @ 130PM and will attend the 115PM exercise class.

## 2021-03-03 ENCOUNTER — Encounter (HOSPITAL_COMMUNITY): Payer: Self-pay

## 2021-03-03 ENCOUNTER — Other Ambulatory Visit: Payer: Self-pay

## 2021-03-03 ENCOUNTER — Encounter (HOSPITAL_COMMUNITY)
Admission: RE | Admit: 2021-03-03 | Discharge: 2021-03-03 | Disposition: A | Discharge status: Home / Self Care | Payer: Medicare Other | Source: Ambulatory Visit | Attending: Pulmonary Disease | Admitting: Pulmonary Disease

## 2021-03-03 VITALS — BP 130/62 | HR 74 | Ht 63.0 in | Wt 207.2 lb

## 2021-03-03 DIAGNOSIS — J4520 Mild intermittent asthma, uncomplicated: Secondary | ICD-10-CM | POA: Insufficient documentation

## 2021-03-03 HISTORY — DX: Unspecified asthma, uncomplicated: J45.909

## 2021-03-03 NOTE — Progress Notes (Signed)
Pulmonary Individual Treatment Plan  Patient Details  Name: Suzanne Conner MRN: [id number] Date of Birth: 04-12-1946 Referring Provider:   April Manson Pulmonary Rehab Walk Test from 03/03/2021 in [facility]  Referring Provider Dr. Loanne Drilling       Initial Encounter Date:  Flowsheet Row Pulmonary Rehab Walk Test from 03/03/2021 in [place]  Date 03/03/21       Visit Diagnosis: Mild intermittent asthma without complication  Patient's Home Medications on Admission:   Current Outpatient Medications:    albuterol (VENTOLIN HFA) 108 (90 Base) MCG/ACT inhaler, Inhale 2 puffs into the lungs every 6 (six) hours as needed for wheezing or shortness of breath., Disp: 3 each, Rfl: 2   aspirin EC 81 MG tablet, Take 81 mg by mouth daily., Disp: , Rfl:    Cholecalciferol (VITAMIN D) 2000 units CAPS, Take 2,000 Units by mouth daily., Disp: , Rfl:    clindamycin (CLEOCIN) 300 MG capsule, Take 300 mg by mouth 2 (two) times daily. Prior to dental appointments, Disp: , Rfl:    Clobetasol Propionate 0.05 % shampoo, Use a small amount on the affected areas twice a day as needed., Disp: 118 mL, Rfl: 0   fluticasone-salmeterol (ADVAIR HFA) 230-21 MCG/ACT inhaler, Inhale 2 puffs into the lungs 2 (two) times daily., Disp: 3 each, Rfl: 3   hydrochlorothiazide (HYDRODIURIL) 12.5 MG tablet, TAKE 1 TABLET DAILY, Disp: 90 tablet, Rfl: 3   hydrocortisone valerate cream (WESTCORT) 0.2 %, Apply 1 application topically 2 (two) times daily., Disp: 45 g, Rfl: 0   latanoprost (XALATAN) 0.005 % ophthalmic solution, Apply to eye., Disp: , Rfl:    metoprolol tartrate (LOPRESSOR) 25 MG tablet, TAKE 1 TABLET TWICE A DAY, Disp: 180 tablet, Rfl: 3   Multiple Vitamins-Minerals (CENTRUM SILVER PO), Take 1 tablet by mouth daily. , Disp: , Rfl:    omeprazole (PRILOSEC) 40 MG capsule, TAKE 1 CAPSULE TWICE A DAY, Disp: 180 capsule, Rfl: 3   Probiotic Product (TRUNATURE  DIGESTIVE PROBIOTIC) CAPS, Take 1 capsule by mouth daily. , Disp: , Rfl:   Past Medical History: Past Medical History:  Diagnosis Date   Arthritis    "left hip; right wrist" (06/12/2018)   Asthma    Depression    Family history of polyps in the colon    GERD (gastroesophageal reflux disease)    Glaucoma    Hypertension    Type 2 diabetes, diet controlled ([place])     Tobacco Use: Social History   Tobacco Use  Smoking Status Never  Smokeless Tobacco Never    Labs: Recent Review Flowsheet Data     Labs for ITP Cardiac and Pulmonary Rehab Latest Ref Rng & Units 05/30/2018 06/17/2019 03/18/2020   Cholestrol <200 mg/dL - 120 135   LDLCALC mg/dL (calc) - 63 76   HDL > OR = 50 mg/dL - 41.10 44(L)   Trlycerides <150 mg/dL - 80.0 74   Hemoglobin A1c <5.7 % of total Hgb 5.7(H) 6.0 5.6       Capillary Blood Glucose: Lab Results  Component Value Date   GLUCAP 168 (H) 06/12/2018   GLUCAP 115 (H) 06/12/2018   GLUCAP 92 06/12/2018   GLUCAP 91 06/12/2018   GLUCAP 95 05/30/2018     Pulmonary Assessment Scores:  Pulmonary Assessment Scores     Row Name 03/03/21 1536         ADL UCSD   ADL Phase Entry     SOB Score  total 17           CAT Score     CAT Score 11           mMRC Score     mMRC Score 2            UCSD: Self-administered rating of dyspnea associated with activities of daily living (ADLs) 6-point scale (0 = "not at all" to 5 = "maximal or unable to do because of breathlessness")  Scoring Scores range from 0 to 120.  Minimally important difference is 5 units  CAT: CAT can identify the health impairment of COPD patients and is better correlated with disease progression.  CAT has a scoring range of zero to 40. The CAT score is classified into four groups of low (less than 10), medium (10 - 20), high (21-30) and very high (31-40) based on the impact level of disease on health status. A CAT score over 10 suggests significant symptoms.  A worsening CAT score  could be explained by an exacerbation, poor medication adherence, poor inhaler technique, or progression of COPD or comorbid conditions.  CAT MCID is 2 points  mMRC: mMRC (Modified Medical Research Council) Dyspnea Scale is used to assess the degree of baseline functional disability in patients of respiratory disease due to dyspnea. No minimal important difference is established. A decrease in score of 1 point or greater is considered a positive change.   Pulmonary Function Assessment:  Pulmonary Function Assessment - 03/03/21 1418       Breath   Shortness of Breath Yes;Limiting activity;Panic with Shortness of Breath;Fear of Shortness of Breath             Exercise Target Goals: Exercise Program Goal: Individual exercise prescription set using results from initial 6 min walk test and THRR while considering  patient's activity barriers and safety.   Exercise Prescription Goal: Initial exercise prescription builds to 30-45 minutes a day of aerobic activity, 2-3 days per week.  Home exercise guidelines will be given to patient during program as part of exercise prescription that the participant will acknowledge.  Activity Barriers & Risk Stratification:  Activity Barriers & Cardiac Risk Stratification - 03/03/21 1417       Activity Barriers & Cardiac Risk Stratification   Activity Barriers Arthritis;Shortness of Breath;Deconditioning;Right Knee Replacement   Left shoulder surgery            6 Minute Walk:  6 Minute Walk     Row Name 03/03/21 1550         6 Minute Walk   Phase Initial     Distance 975 feet     Walk Time 6 minutes     # of Rest Breaks 0     MPH 1.85     METS 2.26     RPE 11     Perceived Dyspnea  1     VO2 Peak 7.91     Symptoms No     Resting HR 74 bpm     Resting BP 130/62     Resting Oxygen Saturation  97 %     Exercise Oxygen Saturation  during 6 min walk 92 %     Max Ex. HR 126 bpm     Max Ex. BP 172/70     2 Minute Post BP 132/60            Interval HR     1 Minute HR 100     2 Minute HR 111  3 Minute HR 117     4 Minute HR 119     5 Minute HR 121     6 Minute HR 126     2 Minute Post HR 88     Interval Heart Rate? Yes           Interval Oxygen     Interval Oxygen? Yes     Baseline Oxygen Saturation % 97 %     1 Minute Oxygen Saturation % 92 %     1 Minute Liters of Oxygen 0 L     2 Minute Oxygen Saturation % 92 %     2 Minute Liters of Oxygen 0 L     3 Minute Oxygen Saturation % 94 %     3 Minute Liters of Oxygen 0 L     4 Minute Oxygen Saturation % 93 %     4 Minute Liters of Oxygen 0 L     5 Minute Oxygen Saturation % 93 %     5 Minute Liters of Oxygen 0 L     6 Minute Oxygen Saturation % 93 %     6 Minute Liters of Oxygen 0 L     2 Minute Post Oxygen Saturation % 97 %     2 Minute Post Liters of Oxygen 0 L             Oxygen Initial Assessment:  Oxygen Initial Assessment - 03/03/21 1418       Home Oxygen   Home Oxygen Device None    Sleep Oxygen Prescription None    Home Exercise Oxygen Prescription None    Home Resting Oxygen Prescription None      Initial 6 min Walk   Oxygen Used None      Program Oxygen Prescription   Program Oxygen Prescription None      Intervention   Short Term Goals To learn and understand importance of monitoring SPO2 with pulse oximeter and demonstrate accurate use of the pulse oximeter.;To learn and understand importance of maintaining oxygen saturations>88%;To learn and demonstrate proper pursed lip breathing techniques or other breathing techniques. ;To learn and demonstrate proper use of respiratory medications    Long  Term Goals Verbalizes importance of monitoring SPO2 with pulse oximeter and return demonstration;Maintenance of O2 saturations>88%;Exhibits proper breathing techniques, such as pursed lip breathing or other method taught during program session;Compliance with respiratory medication;Demonstrates proper use of MDI's             Oxygen  Re-Evaluation:   Oxygen Discharge (Final Oxygen Re-Evaluation):   Initial Exercise Prescription:  Initial Exercise Prescription - 03/03/21 1500       Date of Initial Exercise RX and Referring Provider   Date 03/03/21    Referring Provider Dr. Loanne Drilling    Expected Discharge Date 05/04/21      NuStep   Level 1    SPM 70    Minutes 15      Track   Minutes 15      Prescription Details   Frequency (times per week) 2    Duration Progress to 30 minutes of continuous aerobic without signs/symptoms of physical distress      Intensity   THRR 40-80% of Max Heartrate 59-118    Ratings of Perceived Exertion 11-13    Perceived Dyspnea 0-4      Progression   Progression Continue to progress workloads to maintain intensity without signs/symptoms of physical distress.      Resistance  Training   Training Prescription Yes    Weight Red bands    Reps 10-15             Perform Capillary Blood Glucose checks as needed.  Exercise Prescription Changes:   Exercise Comments:   Exercise Goals and Review:   Exercise Goals     Row Name 03/03/21 1535             Exercise Goals   Increase Physical Activity Yes       Intervention Provide advice, education, support and counseling about physical activity/exercise needs.;Develop an individualized exercise prescription for aerobic and resistive training based on initial evaluation findings, risk stratification, comorbidities and participant's personal goals.       Expected Outcomes Short Term: Attend rehab on a regular basis to increase amount of physical activity.;Long Term: Add in home exercise to make exercise part of routine and to increase amount of physical activity.;Long Term: Exercising regularly at least 3-5 days a week.       Increase Strength and Stamina Yes       Intervention Provide advice, education, support and counseling about physical activity/exercise needs.;Develop an individualized exercise prescription for aerobic  and resistive training based on initial evaluation findings, risk stratification, comorbidities and participant's personal goals.       Expected Outcomes Short Term: Increase workloads from initial exercise prescription for resistance, speed, and METs.;Short Term: Perform resistance training exercises routinely during rehab and add in resistance training at home;Long Term: Improve cardiorespiratory fitness, muscular endurance and strength as measured by increased METs and functional capacity (6MWT)       Able to understand and use rate of perceived exertion (RPE) scale Yes       Intervention Provide education and explanation on how to use RPE scale       Expected Outcomes Long Term:  Able to use RPE to guide intensity level when exercising independently;Short Term: Able to use RPE daily in rehab to express subjective intensity level       Able to understand and use Dyspnea scale Yes       Intervention Provide education and explanation on how to use Dyspnea scale       Expected Outcomes Short Term: Able to use Dyspnea scale daily in rehab to express subjective sense of shortness of breath during exertion;Long Term: Able to use Dyspnea scale to guide intensity level when exercising independently       Knowledge and understanding of Target Heart Rate Range (THRR) Yes       Intervention Provide education and explanation of THRR including how the numbers were predicted and where they are located for reference       Expected Outcomes Short Term: Able to state/look up THRR;Long Term: Able to use THRR to govern intensity when exercising independently;Short Term: Able to use daily as guideline for intensity in rehab       Understanding of Exercise Prescription Yes       Intervention Provide education, explanation, and written materials on patient's individual exercise prescription       Expected Outcomes Short Term: Able to explain program exercise prescription;Long Term: Able to explain home exercise  prescription to exercise independently                Exercise Goals Re-Evaluation :   Discharge Exercise Prescription (Final Exercise Prescription Changes):   Nutrition:  Target Goals: Understanding of nutrition guidelines, daily intake of sodium 1500mg , cholesterol 200mg , calories 30% from fat and 7%  or less from saturated fats, daily to have 5 or more servings of fruits and vegetables.  Biometrics:  Pre Biometrics - 03/03/21 1535       Pre Biometrics   Grip Strength 27 kg              Nutrition Therapy Plan and Nutrition Goals:   Nutrition Assessments:  MEDIFICTS Score Key: ?70 Need to make dietary changes  40-70 Heart Healthy Diet ? 40 Therapeutic Level Cholesterol Diet   Picture Your Plate Scores: <93 Unhealthy dietary pattern with much room for improvement. 41-50 Dietary pattern unlikely to meet recommendations for good health and room for improvement. 51-60 More healthful dietary pattern, with some room for improvement.  >60 Healthy dietary pattern, although there may be some specific behaviors that could be improved.    Nutrition Goals Re-Evaluation:   Nutrition Goals Discharge (Final Nutrition Goals Re-Evaluation):   Psychosocial: Target Goals: Acknowledge presence or absence of significant depression and/or stress, maximize coping skills, provide positive support system. Participant is able to verbalize types and ability to use techniques and skills needed for reducing stress and depression.  Initial Review & Psychosocial Screening:  Initial Psych Review & Screening - 03/03/21 1558       Screening Interventions   Expected Outcomes Long Term Goal: Stressors or current issues are controlled or eliminated.;Long Term goal: The participant improves quality of Life and PHQ9 Scores as seen by post scores and/or verbalization of changes             Quality of Life Scores:  Scores of 19 and below usually indicate a poorer quality of life in  these areas.  A difference of  2-3 points is a clinically meaningful difference.  A difference of 2-3 points in the total score of the Quality of Life Index has been associated with significant improvement in overall quality of life, self-image, physical symptoms, and general health in studies assessing change in quality of life.  PHQ-9: Recent Review Flowsheet Data     Depression screen [facility] 2/9 03/03/2021 06/27/2020 05/25/2020 04/20/2020 03/18/2020   Decreased Interest 0 0 1 0 1   Down, Depressed, Hopeless 1 1 1 1 2    PHQ - 2 Score 1 1 2 1 3    Altered sleeping 1 0 1 1 2    Tired, decreased energy 1 1 1 1 3    Change in appetite 0 1 2 1 1    Feeling bad or failure about yourself  1 0 1 0 1   Trouble concentrating 0 0 1 1 2    Moving slowly or fidgety/restless 0 0 0 0 1   Suicidal thoughts 0 0 0 0 0   PHQ-9 Score - 3 8 5 13    Difficult doing work/chores Somewhat difficult Not difficult at all Somewhat difficult Somewhat difficult Somewhat difficult      Interpretation of Total Score  Total Score Depression Severity:  1-4 = Minimal depression, 5-9 = Mild depression, 10-14 = Moderate depression, 15-19 = Moderately severe depression, 20-27 = Severe depression   Psychosocial Evaluation and Intervention:  Psychosocial Evaluation - 03/03/21 1558       Psychosocial Evaluation & Interventions   Interventions Encouraged to exercise with the program and follow exercise prescription    Comments Patient does show signs of depressions and states she has a history of depression. She has been on depression medication in the past but is not currently on any medication. She attributes her depression to being stuck inside during the pandemic and  her physical limitations since she was diagnosed with an asthma attack. She states she is not able to do things she wants and has been isolated at home during [place] because she is nervous to go out anywhere due to fear of getting COVID. She also lost her sister in 2019  and is still stuggling with that. We discussed counseling and she said she was considering it.    Expected Outcomes For pt to learn and develop healthy coping skills to manage depression symptoms and to improve quality of life by participating in exercise program.    Continue Psychosocial Services  Follow up required by staff             Psychosocial Re-Evaluation:   Psychosocial Discharge (Final Psychosocial Re-Evaluation):   Education: Education Goals: Education classes will be provided on a weekly basis, covering required topics. Participant will state understanding/return demonstration of topics presented.  Learning Barriers/Preferences:  Learning Barriers/Preferences - 03/03/21 1420       Learning Barriers/Preferences   Learning Barriers Sight   Wears glasses   Learning Preferences Skilled Demonstration;Individual Instruction;Written Material             Education Topics: Risk Factor Reduction:  -Group instruction that is supported by a PowerPoint presentation. Instructor discusses the definition of a risk factor, different risk factors for pulmonary disease, and how the heart and lungs work together.     Nutrition for Pulmonary Patient:  -Group instruction provided by PowerPoint slides, verbal discussion, and written materials to support subject matter. The instructor gives an explanation and review of healthy diet recommendations, which includes a discussion on weight management, recommendations for fruit and vegetable consumption, as well as protein, fluid, caffeine, fiber, sodium, sugar, and alcohol. Tips for eating when patients are short of breath are discussed.   Pursed Lip Breathing:  -Group instruction that is supported by demonstration and informational handouts. Instructor discusses the benefits of pursed lip and diaphragmatic breathing and detailed demonstration on how to preform both.     Oxygen Safety:  -Group instruction provided by PowerPoint,  verbal discussion, and written material to support subject matter. There is an overview of "What is Oxygen" and "Why do we need it".  Instructor also reviews how to create a safe environment for oxygen use, the importance of using oxygen as prescribed, and the risks of noncompliance. There is a brief discussion on traveling with oxygen and resources the patient may utilize.   Oxygen Equipment:  -Group instruction provided by [facility] Staff utilizing handouts, written materials, and equipment demonstrations.   Signs and Symptoms:  -Group instruction provided by written material and verbal discussion to support subject matter. Warning signs and symptoms of infection, stroke, and heart attack are reviewed and when to call the physician/911 reinforced. Tips for preventing the spread of infection discussed.   Advanced Directives:  -Group instruction provided by verbal instruction and written material to support subject matter. Instructor reviews Advanced Directive laws and proper instruction for filling out document.   Pulmonary Video:  -Group video education that reviews the importance of medication and oxygen compliance, exercise, good nutrition, pulmonary hygiene, and pursed lip and diaphragmatic breathing for the pulmonary patient.   Exercise for the Pulmonary Patient:  -Group instruction that is supported by a PowerPoint presentation. Instructor discusses benefits of exercise, core components of exercise, frequency, duration, and intensity of an exercise routine, importance of utilizing pulse oximetry during exercise, safety while exercising, and options of places to exercise outside of rehab.  Pulmonary Medications:  -Verbally interactive group education provided by instructor with focus on inhaled medications and proper administration.   Anatomy and Physiology of the Respiratory System and Intimacy:  -Group instruction provided by PowerPoint, verbal discussion, and written  material to support subject matter. Instructor reviews respiratory cycle and anatomical components of the respiratory system and their functions. Instructor also reviews differences in obstructive and restrictive respiratory diseases with examples of each. Intimacy, Sex, and Sexuality differences are reviewed with a discussion on how relationships can change when diagnosed with pulmonary disease. Common sexual concerns are reviewed.   MD DAY -A group question and answer session with a medical doctor that allows participants to ask questions that relate to their pulmonary disease state.   OTHER EDUCATION -Group or individual verbal, written, or video instructions that support the educational goals of the pulmonary rehab program.   Holiday Eating Survival Tips:  -Group instruction provided by PowerPoint slides, verbal discussion, and written materials to support subject matter. The instructor gives patients tips, tricks, and techniques to help them not only survive but enjoy the holidays despite the onslaught of food that accompanies the holidays.   Knowledge Questionnaire Score:  Knowledge Questionnaire Score - 03/03/21 1537       Knowledge Questionnaire Score   Pre Score 14/18             Core Components/Risk Factors/Patient Goals at Admission:  Personal Goals and Risk Factors at Admission - 03/03/21 1603       Core Components/Risk Factors/Patient Goals on Admission    Weight Management Weight Loss    Improve shortness of breath with ADL's Yes    Intervention Provide education, individualized exercise plan and daily activity instruction to help decrease symptoms of SOB with activities of daily living.    Expected Outcomes Short Term: Improve cardiorespiratory fitness to achieve a reduction of symptoms when performing ADLs;Long Term: Be able to perform more ADLs without symptoms or delay the onset of symptoms    Hypertension Yes    Intervention Provide education on lifestyle  modifcations including regular physical activity/exercise, weight management, moderate sodium restriction and increased consumption of fresh fruit, vegetables, and low fat dairy, alcohol moderation, and smoking cessation.;Monitor prescription use compliance.    Expected Outcomes Short Term: Continued assessment and intervention until BP is < 140/19mm HG in hypertensive participants. < 130/63mm HG in hypertensive participants with diabetes, heart failure or chronic kidney disease.;Long Term: Maintenance of blood pressure at goal levels.             Core Components/Risk Factors/Patient Goals Review:    Core Components/Risk Factors/Patient Goals at Discharge (Final Review):    ITP Comments:   Comments:

## 2021-03-03 NOTE — Progress Notes (Signed)
Suzanne Conner 75 y.o. female Pulmonary Rehab Orientation Note This patient who was referred to Pulmonary rehab by Dr. Loanne Drilling with the diagnosis of Mild intermittent asthma without complication arrived today in Cardiac and Pulmonary Rehab. She arrived ambulatory with steady gait. She does not carry portable oxygen. Per pt, she uses oxygen never. Color good, skin warm and dry. Patient is oriented to time and place. Patient's medical history, psychosocial health, and medications reviewed. Psychosocial assessment reveals pt lives with their spouse. Pt is currently retired. Pt hobbies include reading. Pt reports her stress level is moderate. Areas of stress/anxiety include Health.  Pt does exhibit signs of depression. Signs of depression include anxiety and sadness. PHQ2/9 score 1/3. Pt shows fair  coping skills with positive outlook . Pt states she does have a history of depression and has been on depression medication before but is not taking anything currently. Encouraged her to seek counseling and talk to PCP about medication. [name] was offered emotional support and reassurance. Will continue to monitor and evaluate progress toward psychosocial goal(s) of improved quality of life and decreased depressive symptoms through healthy coping mechanisms. Patient reports she does take medications as prescribed. Patient states she follows a Regular diet. The patient reports no specific efforts to gain or lose weight. Patient's weight will be monitored closely. Demonstration and practice of PLB using pulse oximeter. Patient able to return demonstration satisfactorily. Safety and hand hygiene in the exercise area reviewed with patient. Patient voices understanding of the information reviewed. Department expectations discussed with patient and achievable goals were set. The patient shows enthusiasm about attending the program and we look forward to working with this nice lady. The patient completed a 6 min walk test today  and is scheduled to begin exercise on 03/07/21.  45 minutes was spent on a variety of activities such as assessment of the patient, obtaining baseline data including height, weight, BMI, and grip strength, verifying medical history, allergies, and current medications, and teaching patient strategies for performing tasks with less respiratory effort with emphasis on pursed lip breathing.  [name] MS, ACSM CEP 4:16 PM 03/03/2021

## 2021-03-03 NOTE — Progress Notes (Signed)
Pulmonary Rehab Orientation Physical Assessment Note  Physical assessment reveals  Pt is alert and oriented x 3.  Heart rate is normal, breath sounds diminished throughout. Short inspiration and expiration. Reports non-productive cough. Bowel sounds present.  Pt denies abdominal discomfort, nausea, vomiting or diarrhea. Grip strength equal, strong. Distal pulses palpable; Trace swelling to lower extremities. Cherre Huger, BSN Cardiac and Training and development officer

## 2021-03-07 ENCOUNTER — Encounter (HOSPITAL_COMMUNITY)
Admission: RE | Admit: 2021-03-07 | Discharge: 2021-03-07 | Disposition: A | Discharge status: Home / Self Care | Payer: Medicare Other | Source: Ambulatory Visit | Attending: Pulmonary Disease | Admitting: Pulmonary Disease

## 2021-03-07 ENCOUNTER — Other Ambulatory Visit: Payer: Self-pay

## 2021-03-07 VITALS — Wt 207.2 lb

## 2021-03-07 DIAGNOSIS — J4520 Mild intermittent asthma, uncomplicated: Secondary | ICD-10-CM | POA: Diagnosis not present

## 2021-03-07 NOTE — Progress Notes (Signed)
Daily Session Note  Patient Details  Name: Suzanne Conner MRN: [id number] Date of Birth: [birth year] Referring Provider:   April Manson Pulmonary Rehab Walk Test from 03/03/2021 in [place]  Referring Provider Dr. Loanne Drilling       Encounter Date: 03/07/2021  Check In:  Session Check In - 03/07/21 1421       Check-In   Supervising physician immediately available to respond to emergencies [facility] immediately available    Physician(s) Dr. Lonny Prude    Location [facility]    Staff Present Rodney Langton, RN;Olinty Celesta Aver, MS, ACSM CEP, Exercise Physiologist;Other;Delrae Hagey Hassell Done, MS, ACSM-CEP, Exercise Physiologist    Virtual Visit No    Medication changes reported     No    Fall or balance concerns reported    No    Tobacco Cessation No Change    Warm-up and Cool-down Performed as group-led instruction    Resistance Training Performed Yes    VAD Patient? No    PAD/SET Patient? No      Pain Assessment   Currently in Pain? No/denies    Pain Score 0-No pain    Multiple Pain Sites No             Capillary Blood Glucose: No results found for this or any previous visit (from the past 24 hour(s)).   Exercise Prescription Changes - 03/07/21 1500       Response to Exercise   Blood Pressure (Admit) 140/70    Blood Pressure (Exercise) 152/68    Blood Pressure (Exit) 130/62    Heart Rate (Admit) 79 bpm    Heart Rate (Exercise) 89 bpm    Heart Rate (Exit) 80 bpm    Oxygen Saturation (Admit) 99 %    Oxygen Saturation (Exercise) 100 %    Oxygen Saturation (Exit) 97 %    Rating of Perceived Exertion (Exercise) 13    Perceived Dyspnea (Exercise) 1    Duration Continue with 30 min of aerobic exercise without signs/symptoms of physical distress.    Intensity Other (comment)   40-80% HR Max     Progression   Progression Continue to progress workloads to maintain intensity without signs/symptoms of physical distress.       Resistance Training   Training Prescription Yes    Weight Red bands    Reps 10-15    Time 10 Minutes      NuStep   Level 1    SPM 70    Minutes 15    METs 1.6      Track   Laps 9    Minutes 15    METs 2.05             Social History   Tobacco Use  Smoking Status Never  Smokeless Tobacco Never    Goals Met:  Proper associated with RPD/PD & O2 Sat Exercise tolerated well No report of cardiac concerns or symptoms Strength training completed today  Goals Unmet:  Not Applicable  Comments: Service time is from 1315 to 1430 Patient completed first day of exercise and tolerated well with no complaints or concerns.     Dr. Fransico Him is Medical Director for Cardiac Rehab at [facility].

## 2021-03-09 ENCOUNTER — Other Ambulatory Visit: Payer: Self-pay

## 2021-03-09 ENCOUNTER — Encounter (HOSPITAL_COMMUNITY)
Admission: RE | Admit: 2021-03-09 | Discharge: 2021-03-09 | Disposition: A | Discharge status: Home / Self Care | Payer: Medicare Other | Source: Ambulatory Visit | Attending: Pulmonary Disease | Admitting: Pulmonary Disease

## 2021-03-09 DIAGNOSIS — J4520 Mild intermittent asthma, uncomplicated: Secondary | ICD-10-CM | POA: Diagnosis not present

## 2021-03-09 IMAGING — DX DG CHEST 2V
2 series · 2 of 2 positions shown · non-contrast
Comparison: 07/28/2020

CLINICAL DATA: Pulmonary nodule.  Follow-up examination.

EXAM:
CHEST - 2 VIEW

[chest pa]
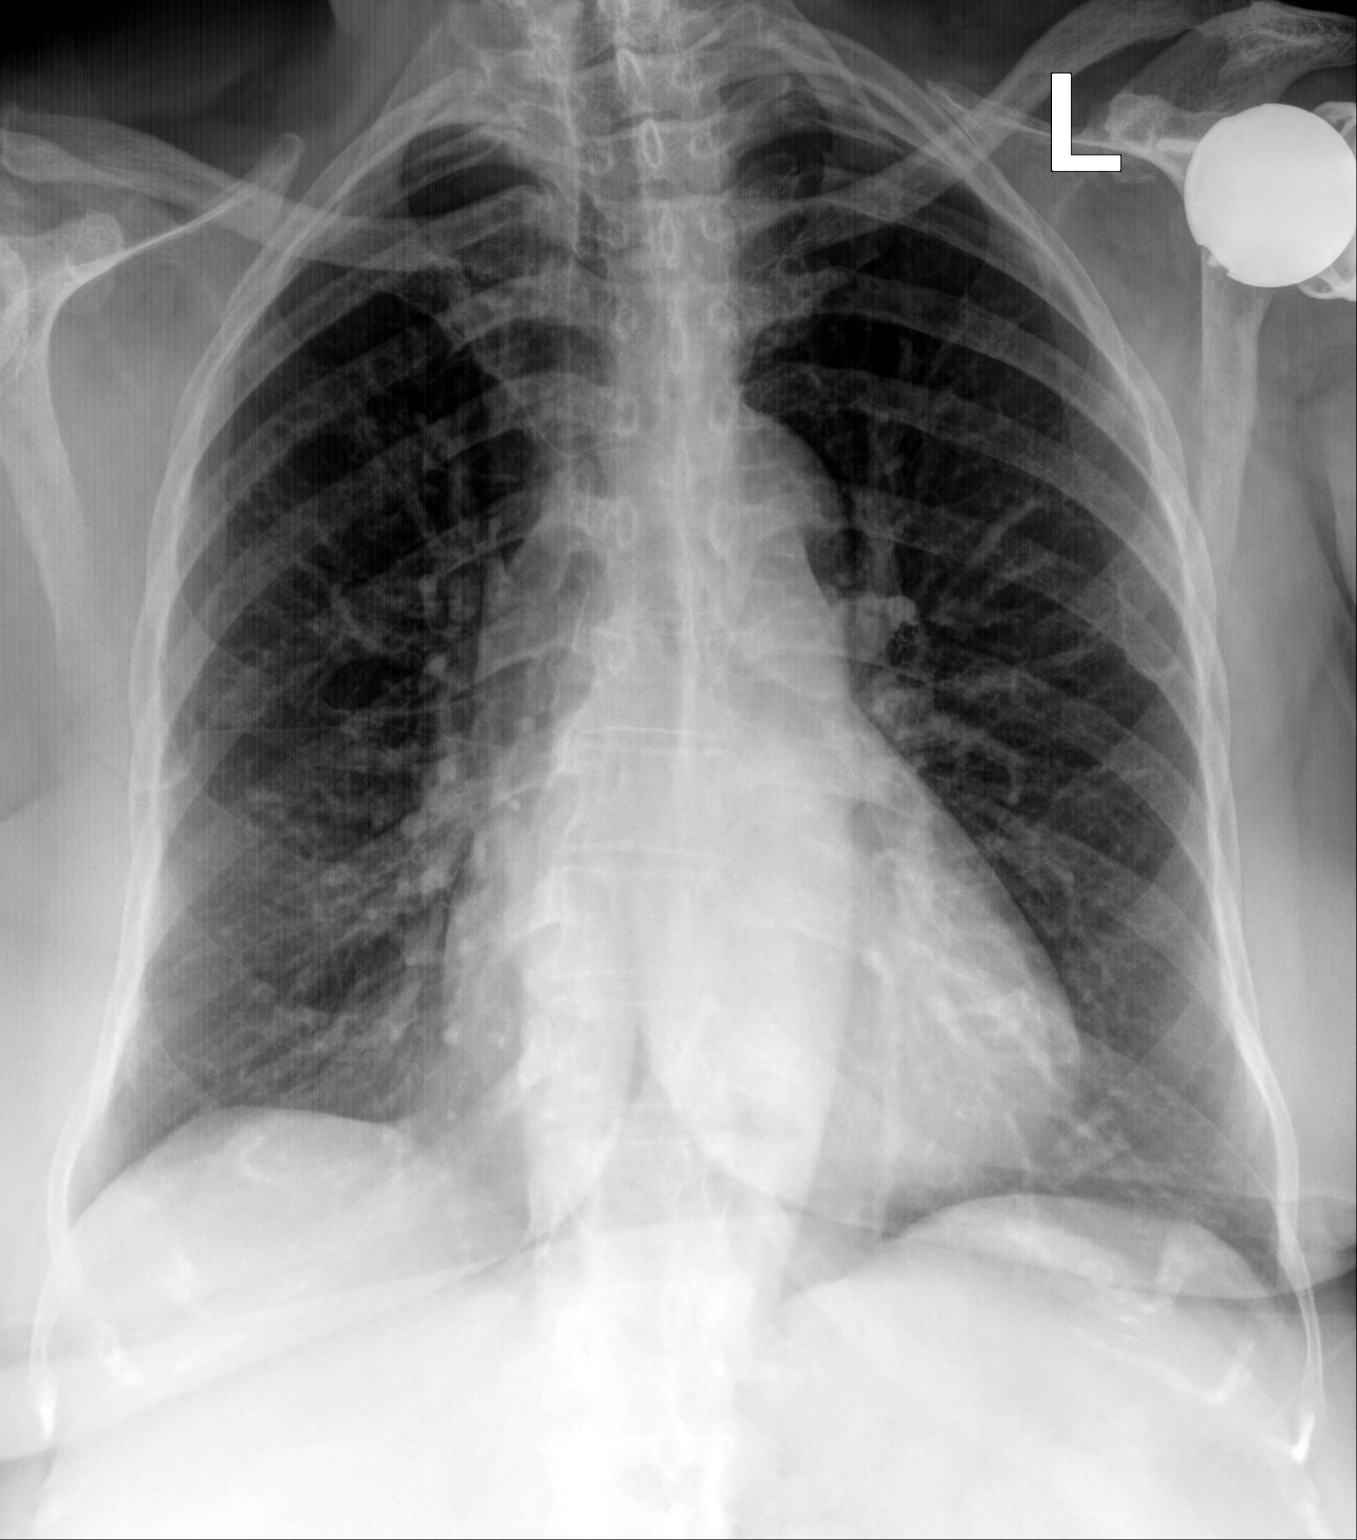

[chest lat]
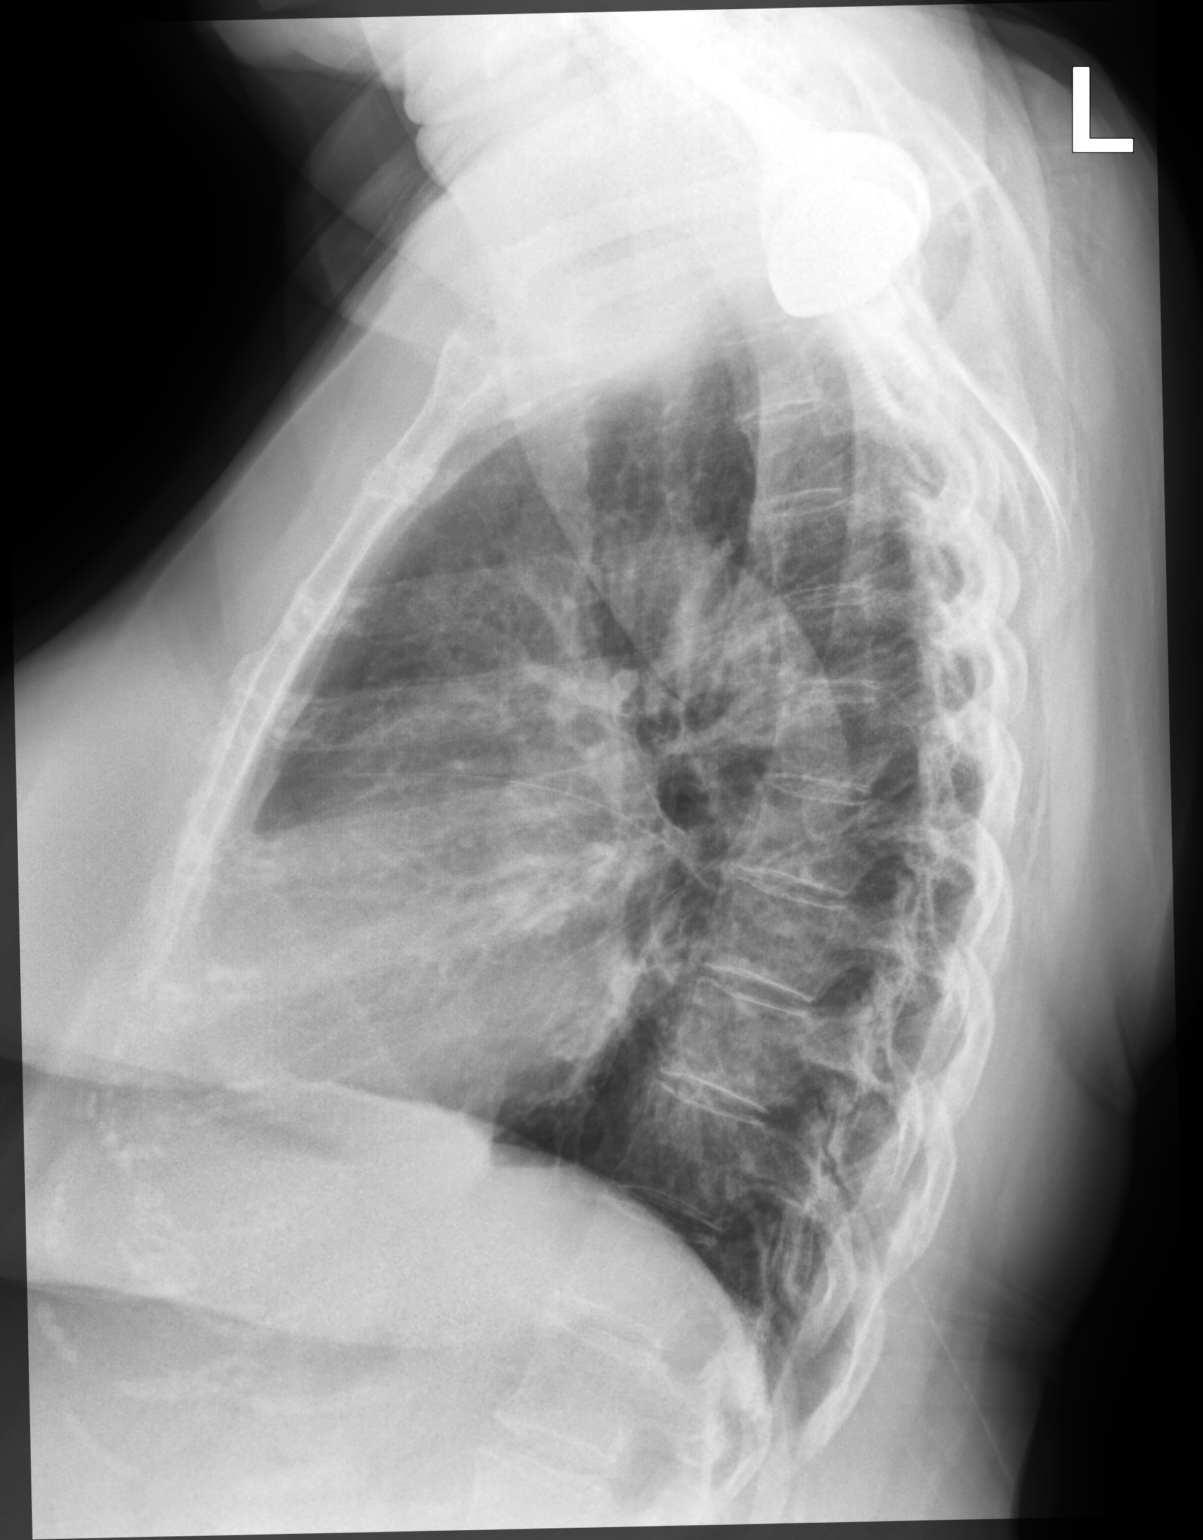

[2 of 2 positions shown; findings below may reference images not displayed]

FINDINGS: Lungs are well expanded, symmetric, and clear. No pneumothorax or
pleural effusion. Cardiac size within normal limits. Pulmonary
vascularity is normal. Osseous structures are age-appropriate. Left
total shoulder arthroplasty has been performed. No acute bone
abnormality.
IMPRESSION: Right mid lung zone nodular opacity has resolved. No focal pulmonary
nodule or infiltrate identified.

## 2021-03-09 NOTE — Progress Notes (Signed)
Daily Session Note  Patient Details  Name: Suzanne Conner MRN: [id number] Date of Birth: Sep 28, 1945 Referring Provider:   April Manson Pulmonary Rehab Walk Test from 03/03/2021 in [place]  Referring Provider Dr. Loanne Drilling       Encounter Date: 03/09/2021  Check In:   Capillary Blood Glucose: No results found for this or any previous visit (from the past 24 hour(s)).    Social History   Tobacco Use  Smoking Status Never  Smokeless Tobacco Never    Goals Met:  Proper associated with RPD/PD & O2 Sat Exercise tolerated well No report of cardiac concerns or symptoms Strength training completed today  Goals Unmet:  Not Applicable  Comments: Service time is from 1315 to 1427    Dr. Fransico Him is Medical Director for Cardiac Rehab at [facility].

## 2021-03-14 ENCOUNTER — Other Ambulatory Visit: Payer: Self-pay

## 2021-03-14 ENCOUNTER — Encounter (HOSPITAL_COMMUNITY)
Admission: RE | Admit: 2021-03-14 | Discharge: 2021-03-14 | Disposition: A | Discharge status: Home / Self Care | Payer: Medicare Other | Source: Ambulatory Visit | Attending: Pulmonary Disease | Admitting: Pulmonary Disease

## 2021-03-14 DIAGNOSIS — J4520 Mild intermittent asthma, uncomplicated: Secondary | ICD-10-CM | POA: Insufficient documentation

## 2021-03-14 NOTE — Progress Notes (Signed)
Daily Session Note  Patient Details  Name: ISEL SKUFCA MRN: [id number] Date of Birth: December 23, 1945 Referring Provider:   April Manson Pulmonary Rehab Walk Test from 03/03/2021 in [place]  Referring Provider Dr. Loanne Drilling       Encounter Date: 03/14/2021  Check In:   Capillary Blood Glucose: No results found for this or any previous visit (from the past 24 hour(s)).    Social History   Tobacco Use  Smoking Status Never  Smokeless Tobacco Never    Goals Met:  Proper associated with RPD/PD & O2 Sat Exercise tolerated well No report of cardiac concerns or symptoms Strength training completed today  Goals Unmet:  Not Applicable  Comments: Service time is from 1315 to 1420    Dr. Fransico Him is Medical Director for Cardiac Rehab at [facility].

## 2021-03-16 ENCOUNTER — Encounter (HOSPITAL_COMMUNITY)
Admission: RE | Admit: 2021-03-16 | Discharge: 2021-03-16 | Disposition: A | Discharge status: Home / Self Care | Payer: Medicare Other | Source: Ambulatory Visit | Attending: Pulmonary Disease | Admitting: Pulmonary Disease

## 2021-03-16 ENCOUNTER — Other Ambulatory Visit: Payer: Self-pay

## 2021-03-16 ENCOUNTER — Telehealth: Payer: Self-pay | Admitting: Family Medicine

## 2021-03-16 DIAGNOSIS — J4520 Mild intermittent asthma, uncomplicated: Secondary | ICD-10-CM

## 2021-03-16 NOTE — Telephone Encounter (Signed)
Left message for patient to call back and schedule Medicare Annual Wellness Visit (AWV) either virtually or in office.   Last AWV 03/18/20 please schedule at anytime with [facility] Nurse Health Advisor 1 or 2   This should be a 45 minute visit.

## 2021-03-16 NOTE — Progress Notes (Signed)
Daily Session Note  Patient Details  Name: Suzanne Conner MRN: [id number] Date of Birth: 08-10-1946 Referring Provider:   April Manson Pulmonary Rehab Walk Test from 03/03/2021 in [place]  Referring Provider Dr. Loanne Drilling       Encounter Date: 03/16/2021  Check In:  Session Check In - 03/16/21 1509       Check-In   Supervising physician immediately available to respond to emergencies [facility] immediately available    Physician(s) Dr. Cruzita Lederer    [facility]    Staff Present [name], RN, Isaac Laud, MS, ACSM-CEP, Exercise Physiologist;Annedrea Rosezella [place], RN, Ramonita Lab, RN    Virtual Visit No    Medication changes reported     No    Fall or balance concerns reported    No    Tobacco Cessation No Change    Warm-up and Cool-down Performed as group-led instruction    Resistance Training Performed Yes    VAD Patient? No    PAD/SET Patient? No      Pain Assessment   Currently in Pain? No/denies    Multiple Pain Sites No             Capillary Blood Glucose: No results found for this or any previous visit (from the past 24 hour(s)).    Social History   Tobacco Use  Smoking Status Never  Smokeless Tobacco Never    Goals Met:  Proper associated with RPD/PD & O2 Sat Exercise tolerated well No report of cardiac concerns or symptoms Strength training completed today  Goals Unmet:  Not Applicable  Comments: Service time is from 1315 to 1430    Dr. Fransico Him is Medical Director for Cardiac Rehab at [facility].

## 2021-03-17 NOTE — Progress Notes (Addendum)
DARCELL YACOUB 75 y.o. female Nutrition Note  Diagnosis: Mild intermittent asthma without complication  Past Medical History:  Diagnosis Date   Arthritis    "left hip; right wrist" (06/12/2018)   Asthma    Depression    Family history of polyps in the colon    GERD (gastroesophageal reflux disease)    Glaucoma    Hypertension    Type 2 diabetes, diet controlled ([place])      Medications reviewed.   Current Outpatient Medications:    albuterol (VENTOLIN HFA) 108 (90 Base) MCG/ACT inhaler, Inhale 2 puffs into the lungs every 6 (six) hours as needed for wheezing or shortness of breath., Disp: 3 each, Rfl: 2   aspirin EC 81 MG tablet, Take 81 mg by mouth daily., Disp: , Rfl:    Cholecalciferol (VITAMIN D) 2000 units CAPS, Take 2,000 Units by mouth daily., Disp: , Rfl:    clindamycin (CLEOCIN) 300 MG capsule, Take 300 mg by mouth 2 (two) times daily. Prior to dental appointments, Disp: , Rfl:    Clobetasol Propionate 0.05 % shampoo, Use a small amount on the affected areas twice a day as needed., Disp: 118 mL, Rfl: 0   fluticasone-salmeterol (ADVAIR HFA) 230-21 MCG/ACT inhaler, Inhale 2 puffs into the lungs 2 (two) times daily., Disp: 3 each, Rfl: 3   hydrochlorothiazide (HYDRODIURIL) 12.5 MG tablet, TAKE 1 TABLET DAILY, Disp: 90 tablet, Rfl: 3   hydrocortisone valerate cream (WESTCORT) 0.2 %, Apply 1 application topically 2 (two) times daily., Disp: 45 g, Rfl: 0   latanoprost (XALATAN) 0.005 % ophthalmic solution, Apply to eye., Disp: , Rfl:    metoprolol tartrate (LOPRESSOR) 25 MG tablet, TAKE 1 TABLET TWICE A DAY, Disp: 180 tablet, Rfl: 3   Multiple Vitamins-Minerals (CENTRUM SILVER PO), Take 1 tablet by mouth daily. , Disp: , Rfl:    omeprazole (PRILOSEC) 40 MG capsule, TAKE 1 CAPSULE TWICE A DAY, Disp: 180 capsule, Rfl: 3   Probiotic Product (TRUNATURE DIGESTIVE PROBIOTIC) CAPS, Take 1 capsule by mouth daily. , Disp: , Rfl:    Ht Readings from Last 1 Encounters:  03/03/21 5\' 3"  (1.6  m)     Wt Readings from Last 3 Encounters:  03/07/21 207 lb 3.7 oz (94 kg)  03/03/21 207 lb 3.7 oz (94 kg)  01/04/21 208 lb 12.8 oz (94.7 kg)     There is no height or weight on file to calculate BMI.   Social History   Tobacco Use  Smoking Status Never  Smokeless Tobacco Never      Nutrition Note  Spoke with pt. Nutrition Plan and Nutrition Survey goals reviewed with pt.  Pt has been sedentary due to isolation/pandemic. She has recently started adding exercise into her lifestyle at the [facility] and pulmonary rehab.  She reports emotional eating/eating as a coping skill.  She used to see a Social worker when she lived in [place] but does not have one anymore.  She eats 3 balanced meals per day. She thinks her snacks and portion sizes are causing her to gain weight.  She cooks with low sodium ingredients and uses healthy fats.  Pt states she would like to lose weight.  Reviewed weight history.   Pt expressed understanding of the information reviewed.    Nutrition Diagnosis Food-and nutrition-related knowledge deficit related to lack of exposure to information as related to diagnosis of: ? HTN? pt goal of sustainable weight loss  Nutrition Intervention Pt's individual nutrition plan reviewed with pt. Benefits of adopting healthy  diet reviewed with Rate My Plate survey   Continue client-centered nutrition education by RD, as part of interdisciplinary care.  Goal(s)  Pt to identify food quantities necessary to achieve weight loss of 6-24 lb at graduation from cardiac rehab.  Pt to build a healthy plate including vegetables, fruits, whole grains, and low-fat dairy products in a heart healthy meal plan.  Plan:  Will provide client-centered nutrition education as part of interdisciplinary care Monitor and evaluate progress toward nutrition goal with team.   Michaele Offer, MS, RDN, LDN

## 2021-03-21 ENCOUNTER — Encounter (HOSPITAL_COMMUNITY)
Admission: RE | Admit: 2021-03-21 | Discharge: 2021-03-21 | Disposition: A | Discharge status: Home / Self Care | Payer: Medicare Other | Source: Ambulatory Visit | Attending: Pulmonary Disease | Admitting: Pulmonary Disease

## 2021-03-21 ENCOUNTER — Ambulatory Visit (INDEPENDENT_AMBULATORY_CARE_PROVIDER_SITE_OTHER): Payer: Medicare Other | Admitting: Pulmonary Disease

## 2021-03-21 ENCOUNTER — Encounter: Payer: Self-pay | Admitting: Pulmonary Disease

## 2021-03-21 ENCOUNTER — Other Ambulatory Visit: Payer: Self-pay

## 2021-03-21 VITALS — Wt 208.8 lb

## 2021-03-21 VITALS — BP 130/70 | HR 72 | Temp 98.0°F | Ht 64.0 in | Wt 207.0 lb

## 2021-03-21 DIAGNOSIS — J4540 Moderate persistent asthma, uncomplicated: Secondary | ICD-10-CM

## 2021-03-21 DIAGNOSIS — J4520 Mild intermittent asthma, uncomplicated: Secondary | ICD-10-CM | POA: Diagnosis not present

## 2021-03-21 DIAGNOSIS — D860 Sarcoidosis of lung: Secondary | ICD-10-CM | POA: Insufficient documentation

## 2021-03-21 DIAGNOSIS — J4550 Severe persistent asthma, uncomplicated: Secondary | ICD-10-CM | POA: Insufficient documentation

## 2021-03-21 MED ORDER — ADVAIR HFA 230-21 MCG/ACT IN AERO: 2.0000 | INHALATION_SPRAY | Freq: Two times a day (BID) | RESPIRATORY_TRACT | 3 refills | Status: DC

## 2021-03-21 MED ORDER — ALBUTEROL SULFATE HFA 108 (90 BASE) MCG/ACT IN AERS: 2.0000 | INHALATION_SPRAY | Freq: Four times a day (QID) | RESPIRATORY_TRACT | 2 refills | Status: DC | PRN

## 2021-03-21 NOTE — Progress Notes (Signed)
Subjective:   PATIENT ID: Suzanne Conner GENDER: female DOB: 11/05/1945, MRN: [id number]   HPI  Chief Complaint  Patient presents with   Follow-up    Pt states she has been doing okay since last visit and states that she is about the same since last visit.    Reason for Visit: Follow-up  Suzanne Conner is a 75 year old female never smoker with childhood asthma, diabetes (diet controlled), hypertension and GERD who presents as a referral from her PCP for shortness of breath and wheezing.  Synopsis: She was previously seen by a Pulmonary doctor in [place] 22 years ago for sarcoid diagnosed via bronchoscopy. She is unable to recall any prolonged treatment. Has not had any respiratory issues since then. Was only seen for ~1 year and reports CXR improved during that time. Denies skin involvement except for seborrheic keratosis. Sees an ophthalmologist for high optic pressures. Two months prior to our consultation, she reports shortness of breath and wheezing. She was treated with prednisone and albuterol at an urgent care. Still had persistent wheezing especially worse at night and activity. She lives in a two story home and now having difficulty. Denies cough, chest pain. Compliant with her Advair which she takes two puffs BID. Still has breakthrough wheezing. Does not use albuterol. No longer has nocturnal awakenings since starting Advair  01/04/21 Since our last visit, we increased her Advair to 230-21 mcg. She reports minimal wheezing at night which is improved. Still has shortness of breath. She tries not to exert herself because she gets short of breath. Walking around the store can be difficult. Since the pandemic, she has been less active and has had 15lb weight gain during the time. She is wanting to be more active [facility] and doing [facility] aerobics.  03/21/21 Since our last visit, she has become more active. She started Pulmonary Rehab two weeks ago and is starting to  challenging herself. She is going to water aerobics twice a week. Still has shortness of breath and fatigue after activity but this is much improved. Compliant with her Advair. Uses albuterol twice a week. Reports wheezing at night has improved.   Review of Systems  Constitutional:  Positive for malaise/fatigue. Negative for chills, diaphoresis, fever and weight loss.  HENT:  Negative for congestion.   Respiratory:  Positive for shortness of breath. Negative for cough, hemoptysis, sputum production and wheezing.   Cardiovascular:  Negative for chest pain, palpitations and leg swelling.    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  Past Medical History:  Diagnosis Date   Arthritis    "left hip; right wrist" (06/12/2018)   Asthma    Depression    Family history of polyps in the colon    GERD (gastroesophageal reflux disease)    Glaucoma    Hypertension    Type 2 diabetes, diet controlled ([place])      Allergies  Allergen Reactions   Penicillins Hives and Other (See Comments)    PATIENT HAS HAD A PCN REACTION WITH IMMEDIATE RASH, FACIAL/TONGUE/THROAT SWELLING, SOB, OR LIGHTHEADEDNESS WITH HYPOTENSION:  #  #  YES  #  #  INCLUDING HIVES AS A CHILD Has patient had a PCN reaction causing severe rash involving mucus membranes or skin necrosis: No Has patient had a PCN reaction that required hospitalization: No Has patient had a PCN reaction occurring within the last 10 years: No If all of the above answers are "NO", then may proceed  with Cephalosporin use. PATIENT HAS HAD A PCN REACTION WITH IMMEDIATE RASH, FACIAL/TONGUE/THROAT SWELLING, SOB, OR LIGHTHEADEDNESS WITH HYPOTENSION:  #  #  YES  #  #  INCLUDING HIVES AS A CHILD Has patient had a PCN reaction causing severe rash involving mucus membranes or skin necrosis: No Has patient had a PCN reaction that required hospitalization: No Has patient had a PCN reaction occurring within the last 10 years: No If all of the above answers  are "NO", then may proceed with Cephalosporin use.    Paxil [Paroxetine]     Paxil 20 mg caused a tremor.     Outpatient Medications Prior to Visit  Medication Sig Dispense Refill   albuterol (VENTOLIN HFA) 108 (90 Base) MCG/ACT inhaler Inhale 2 puffs into the lungs every 6 (six) hours as needed for wheezing or shortness of breath. 3 each 2   aspirin EC 81 MG tablet Take 81 mg by mouth daily.     Cholecalciferol (VITAMIN D) 2000 units CAPS Take 2,000 Units by mouth daily.     clindamycin (CLEOCIN) 300 MG capsule Take 300 mg by mouth 2 (two) times daily. Prior to dental appointments     Clobetasol Propionate 0.05 % shampoo Use a small amount on the affected areas twice a day as needed. 118 mL 0   fluticasone-salmeterol (ADVAIR HFA) 230-21 MCG/ACT inhaler Inhale 2 puffs into the lungs 2 (two) times daily. 3 each 3   hydrochlorothiazide (HYDRODIURIL) 12.5 MG tablet TAKE 1 TABLET DAILY 90 tablet 3   hydrocortisone valerate cream (WESTCORT) 0.2 % Apply 1 application topically 2 (two) times daily. 45 g 0   latanoprost (XALATAN) 0.005 % ophthalmic solution Apply to eye.     Melatonin 10 MG TABS Take 1 tablet by mouth at bedtime as needed.     metoprolol tartrate (LOPRESSOR) 25 MG tablet TAKE 1 TABLET TWICE A DAY 180 tablet 3   Multiple Vitamins-Minerals (CENTRUM SILVER PO) Take 1 tablet by mouth daily.      omeprazole (PRILOSEC) 40 MG capsule TAKE 1 CAPSULE TWICE A DAY 180 capsule 3   Probiotic Product (TRUNATURE DIGESTIVE PROBIOTIC) CAPS Take 1 capsule by mouth daily.      No facility-administered medications prior to visit.    Objective:   Vitals:   03/21/21 0858  BP: 130/70  Pulse: 72  Temp: 98 F (36.7 C)  TempSrc: Oral  SpO2: 100%  Weight: 207 lb (93.9 kg)  Height: 5\' 4"  (1.626 m)   SpO2: 100 % (RA) O2 Device: None (Room air)  Physical Exam: General: Well-appearing, no acute distress HENT: [place], AT Eyes: EOMI, no scleral icterus Respiratory: Clear to auscultation  bilaterally.  No crackles, wheezing or rales Cardiovascular: RRR, -M/R/G, no JVD Extremities:-Edema,-tenderness Neuro: AAO x4, CNII-XII grossly intact Psych: Normal mood, normal affect  Data Reviewed:  Imaging: CXR 07/12/2020-no infiltrate, effusion or edema.  PFT: 01/04/21 FVC 1.49 (62%) FEV1 1.10 (59%) Ratio 73  TLC 63% DLCO 62% Interpretation: Mixed obstructive and restrictive defect with mildly reduced DLCO. No significant bronchodilator response however does not preclude benefit of bronchodilators  Labs: CBC    Component Value Date/Time   WBC 7.4 10/05/2020 1141   RBC 4.60 10/05/2020 1141   HGB 13.0 10/05/2020 1141   HCT 38.8 10/05/2020 1141   PLT 298.0 Repeated and verified X2. 10/05/2020 1141   MCV 84.4 10/05/2020 1141   MCH 27.7 03/18/2020 1135   MCHC 33.5 10/05/2020 1141   RDW 14.0 10/05/2020 1141   LYMPHSABS 2,025 03/18/2020  1135   EOSABS 70 03/18/2020 1135   BASOSABS 32 03/18/2020 1135  Absolute eos 03/18/20-70    Assessment & Plan:   Discussion: 75 year old female never smoker with history of childhood asthma and pulmonary sarcoid who presents for follow-up.  Based on her history of sarcoid management normal CXR, she likely had Lofgren syndrome.  PFTs demonstrate mixed defect likely related to asthma in setting of sarcoid. Current symptoms are consistent with asthma rather than reactivation of sarcoid.  Reviewed bronchodilator management and asthma action plan.  Mild/moderatere persistent asthma-improved - CONTINUE Adviar 230-21 mcg TWO puffs TWICE a day. REFILL via mail order (3 month supply) - CONTINUE Albuterol TWO puffs as needed for shortness of breath or wheezing. REFILL via mail order (3 month supply)   Asthma action plan Increase Advair 2 puffs three times a day for worsening shortness of breath, wheezing and cough. If you symptoms do not improve in 24-48 hours, please our office for evaluation and/or prednisone taper.   Pulmonary sarcoidosis- not  active - Dx in 1999 via endobronchial bx. No records available - No indication for prednisone therapy - Annual PFTs. Due 12/2021 - Recommend annual ophthalmology exam. Last visit on Dec 2021. Currently scheduled in 02/2021 - Reviewed EKG. Prolonged P-R interval - CONTINUE Pulmonary Rehab - Plan for repeat PFTs in 6-12 months pending clinical improvement (06/2021)   Health Maintenance Immunization History  Administered Date(s) Administered   Fluad Quad(high Dose 65+) 06/22/2020   Influenza, High Dose Seasonal PF 05/12/2019   Influenza-Unspecified 05/29/2018, 05/12/2019   PFIZER(Purple Top)SARS-COV-2 Vaccination 10/01/2019, 10/22/2019, 06/17/2020   Pneumococcal Polysaccharide-23 06/13/2018   Zoster Recombinat (Shingrix) 03/10/2019, 05/12/2019   Zoster, Live 04/27/2008   CT Lung Screen - not indicated  No orders of the defined types were placed in this encounter.  Meds ordered this encounter  Medications   albuterol (VENTOLIN HFA) 108 (90 Base) MCG/ACT inhaler    Sig: Inhale 2 puffs into the lungs every 6 (six) hours as needed for wheezing or shortness of breath.    Dispense:  3 each    Refill:  2    Provide 3 month supply.   fluticasone-salmeterol (ADVAIR HFA) 230-21 MCG/ACT inhaler    Sig: Inhale 2 puffs into the lungs 2 (two) times daily.    Dispense:  3 each    Refill:  3    Provide 3 month refill at a time.    No follow-ups on file.  I have spent a total time of 32-minutes on the day of the appointment reviewing prior documentation, coordinating care and discussing medical diagnosis and plan with the patient/family. Past medical history, allergies, medications were reviewed. Pertinent imaging, labs and tests included in this note have been reviewed and interpreted independently by me.   Pierce, MD [facility] 03/21/2021 9:06 AM  Office Number [phone]

## 2021-03-21 NOTE — Progress Notes (Signed)
Daily Session Note  Patient Details  Name: Suzanne Conner MRN: [id number] Date of Birth: February 17, 1946 Referring Provider:   April Manson Pulmonary Rehab Walk Test from 03/03/2021 in [place]  Referring Provider Dr. Loanne Drilling       Encounter Date: 03/21/2021  Check In:  Session Check In - 03/21/21 1429       Check-In   Supervising physician immediately available to respond to emergencies [facility] immediately available    Physician(s) Dr. Cruzita Lederer    Location [facility]    Staff Present [name], RN, BSN;Boomer Winders Ysidro Evert, RN;David [place], MS, ACSM-CEP, CCRP, Exercise Physiologist;Jessica Hassell Done, MS, ACSM-CEP, Exercise Physiologist    Virtual Visit No    Medication changes reported     No    Fall or balance concerns reported    No    Tobacco Cessation No Change    Warm-up and Cool-down Performed as group-led instruction    Resistance Training Performed Yes    VAD Patient? No    PAD/SET Patient? No      Pain Assessment   Currently in Pain? No/denies    Multiple Pain Sites No             Capillary Blood Glucose: No results found for this or any previous visit (from the past 24 hour(s)).   Exercise Prescription Changes - 03/21/21 1500       Response to Exercise   Blood Pressure (Admit) 138/60    Blood Pressure (Exercise) 136/64    Blood Pressure (Exit) 114/62    Heart Rate (Admit) 70 bpm    Heart Rate (Exercise) 109 bpm    Heart Rate (Exit) 81 bpm    Oxygen Saturation (Admit) 98 %    Oxygen Saturation (Exercise) 94 %    Oxygen Saturation (Exit) 98 %    Rating of Perceived Exertion (Exercise) 12    Perceived Dyspnea (Exercise) 2    Duration Continue with 30 min of aerobic exercise without signs/symptoms of physical distress.    Intensity THRR unchanged      Progression   Progression Continue to progress workloads to maintain intensity without signs/symptoms of physical distress.      Resistance  Training   Training Prescription Yes    Weight Red bands    Reps 10-15    Time 10 Minutes      NuStep   Level 3    SPM 80    Minutes 15    METs 1.9      Track   Laps 15    Minutes 15             Social History   Tobacco Use  Smoking Status Never  Smokeless Tobacco Never    Goals Met:  Exercise tolerated well No report of cardiac concerns or symptoms Strength training completed today  Goals Unmet:  Not Applicable  Comments: Service time is from 1317 to 1437    Dr. Fransico Him is Medical Director for Cardiac Rehab at [facility].

## 2021-03-21 NOTE — Patient Instructions (Addendum)
Mild/moderatere persistent asthma - CONTINUE Adviar 230-21 mcg TWO puffs TWICE a day. REFILL via mail order (3 month supply) - CONTINUE Albuterol TWO puffs as needed for shortness of breath or wheezing. REFILL via mail order (3 month supply)   Asthma action plan Increase Advair 2 puffs three times a day for worsening shortness of breath, wheezing and cough. If you symptoms do not improve in 24-48 hours, please our office for evaluation and/or prednisone taper.  Follow-up in 6 months

## 2021-03-23 ENCOUNTER — Encounter (HOSPITAL_COMMUNITY): Payer: Medicare Other

## 2021-03-23 ENCOUNTER — Telehealth (HOSPITAL_COMMUNITY): Payer: Self-pay | Admitting: Family Medicine

## 2021-03-28 ENCOUNTER — Other Ambulatory Visit: Payer: Self-pay

## 2021-03-28 ENCOUNTER — Encounter (HOSPITAL_COMMUNITY)
Admission: RE | Admit: 2021-03-28 | Discharge: 2021-03-28 | Disposition: A | Discharge status: Home / Self Care | Payer: Medicare Other | Source: Ambulatory Visit | Attending: Pulmonary Disease | Admitting: Pulmonary Disease

## 2021-03-28 DIAGNOSIS — J4520 Mild intermittent asthma, uncomplicated: Secondary | ICD-10-CM

## 2021-03-28 NOTE — Progress Notes (Signed)
Daily Session Note  Patient Details  Name: Suzanne Conner MRN: [id number] Date of Birth: 02/28/1946 Referring Provider:   April Manson Pulmonary Rehab Walk Test from 03/03/2021 in [place]  Referring Provider Dr. Loanne Drilling       Encounter Date: 03/28/2021  Check In:  Session Check In - 03/28/21 1506       Check-In   Supervising physician immediately available to respond to emergencies [facility] immediately available    Physician(s) Dr. Sabino Gasser    Location [facility]    Staff Present [name], RN, Isaac Laud, MS, ACSM-CEP, Exercise Physiologist;Raynah Gomes Ysidro Evert, RN    Virtual Visit No    Medication changes reported     No    Fall or balance concerns reported    No    Tobacco Cessation No Change    Warm-up and Cool-down Performed as group-led instruction    Resistance Training Performed Yes    VAD Patient? No    PAD/SET Patient? No      Pain Assessment   Currently in Pain? No/denies    Multiple Pain Sites No             Capillary Blood Glucose: No results found for this or any previous visit (from the past 24 hour(s)).    Social History   Tobacco Use  Smoking Status Never  Smokeless Tobacco Never    Goals Met:  Exercise tolerated well No report of cardiac concerns or symptoms Strength training completed today  Goals Unmet:  Not Applicable.  Comments: Service time is from 45 to [place]    Dr. Fransico Him is Medical Director for Cardiac Rehab at [facility].

## 2021-03-28 NOTE — Progress Notes (Signed)
Pulmonary Individual Treatment Plan  Patient Details  Name: Suzanne Conner MRN: [id number] Date of Birth: February 14, 1946 Referring Provider:   April Manson Pulmonary Rehab Walk Test from 03/03/2021 in [facility]  Referring Provider Dr. Loanne Drilling       Initial Encounter Date:  Flowsheet Row Pulmonary Rehab Walk Test from 03/03/2021 in [facility]  Date 03/03/21       Visit Diagnosis: Mild intermittent asthma without complication  Patient's Home Medications on Admission:   Current Outpatient Medications:    albuterol (VENTOLIN HFA) 108 (90 Base) MCG/ACT inhaler, Inhale 2 puffs into the lungs every 6 (six) hours as needed for wheezing or shortness of breath., Disp: 3 each, Rfl: 2   aspirin EC 81 MG tablet, Take 81 mg by mouth daily., Disp: , Rfl:    Cholecalciferol (VITAMIN D) 2000 units CAPS, Take 2,000 Units by mouth daily., Disp: , Rfl:    clindamycin (CLEOCIN) 300 MG capsule, Take 300 mg by mouth 2 (two) times daily. Prior to dental appointments, Disp: , Rfl:    Clobetasol Propionate 0.05 % shampoo, Use a small amount on the affected areas twice a day as needed., Disp: 118 mL, Rfl: 0   fluticasone-salmeterol (ADVAIR HFA) 230-21 MCG/ACT inhaler, Inhale 2 puffs into the lungs 2 (two) times daily., Disp: 3 each, Rfl: 3   hydrochlorothiazide (HYDRODIURIL) 12.5 MG tablet, TAKE 1 TABLET DAILY, Disp: 90 tablet, Rfl: 3   hydrocortisone valerate cream (WESTCORT) 0.2 %, Apply 1 application topically 2 (two) times daily., Disp: 45 g, Rfl: 0   latanoprost (XALATAN) 0.005 % ophthalmic solution, Apply to eye., Disp: , Rfl:    Melatonin 10 MG TABS, Take 1 tablet by mouth at bedtime as needed., Disp: , Rfl:    metoprolol tartrate (LOPRESSOR) 25 MG tablet, TAKE 1 TABLET TWICE A DAY, Disp: 180 tablet, Rfl: 3   Multiple Vitamins-Minerals (CENTRUM SILVER PO), Take 1 tablet by mouth daily. , Disp: , Rfl:    omeprazole (PRILOSEC) 40 MG capsule,  TAKE 1 CAPSULE TWICE A DAY, Disp: 180 capsule, Rfl: 3   Probiotic Product (TRUNATURE DIGESTIVE PROBIOTIC) CAPS, Take 1 capsule by mouth daily. , Disp: , Rfl:   Past Medical History: Past Medical History:  Diagnosis Date   Arthritis    "left hip; right wrist" (06/12/2018)   Asthma    Depression    Family history of polyps in the colon    GERD (gastroesophageal reflux disease)    Glaucoma    Hypertension    Type 2 diabetes, diet controlled ([place])     Tobacco Use: Social History   Tobacco Use  Smoking Status Never  Smokeless Tobacco Never    Labs: Recent Review Flowsheet Data     Labs for ITP Cardiac and Pulmonary Rehab Latest Ref Rng & Units 05/30/2018 06/17/2019 03/18/2020   Cholestrol <200 mg/dL - 120 135   LDLCALC mg/dL (calc) - 63 76   HDL > OR = 50 mg/dL - 41.10 44(L)   Trlycerides <150 mg/dL - 80.0 74   Hemoglobin A1c <5.7 % of total Hgb 5.7(H) 6.0 5.6       Capillary Blood Glucose: Lab Results  Component Value Date   GLUCAP 168 (H) 06/12/2018   GLUCAP 115 (H) 06/12/2018   GLUCAP 92 06/12/2018   GLUCAP 91 06/12/2018   GLUCAP 95 05/30/2018     Pulmonary Assessment Scores:  Pulmonary Assessment Scores     Row Name 03/03/21 1536  ADL UCSD   ADL Phase Entry     SOB Score total 17           CAT Score     CAT Score 11           mMRC Score     mMRC Score 2            UCSD: Self-administered rating of dyspnea associated with activities of daily living (ADLs) 6-point scale (0 = "not at all" to 5 = "maximal or unable to do because of breathlessness")  Scoring Scores range from 0 to 120.  Minimally important difference is 5 units  CAT: CAT can identify the health impairment of COPD patients and is better correlated with disease progression.  CAT has a scoring range of zero to 40. The CAT score is classified into four groups of low (less than 10), medium (10 - 20), high (21-30) and very high (31-40) based on the impact level of disease on health  status. A CAT score over 10 suggests significant symptoms.  A worsening CAT score could be explained by an exacerbation, poor medication adherence, poor inhaler technique, or progression of COPD or comorbid conditions.  CAT MCID is 2 points  mMRC: mMRC (Modified Medical Research Council) Dyspnea Scale is used to assess the degree of baseline functional disability in patients of respiratory disease due to dyspnea. No minimal important difference is established. A decrease in score of 1 point or greater is considered a positive change.   Pulmonary Function Assessment:  Pulmonary Function Assessment - 03/03/21 1418       Breath   Shortness of Breath Yes;Limiting activity;Panic with Shortness of Breath;Fear of Shortness of Breath             Exercise Target Goals: Exercise Program Goal: Individual exercise prescription set using results from initial 6 min walk test and THRR while considering  patient's activity barriers and safety.   Exercise Prescription Goal: Initial exercise prescription builds to 30-45 minutes a day of aerobic activity, 2-3 days per week.  Home exercise guidelines will be given to patient during program as part of exercise prescription that the participant will acknowledge.  Activity Barriers & Risk Stratification:  Activity Barriers & Cardiac Risk Stratification - 03/03/21 1417       Activity Barriers & Cardiac Risk Stratification   Activity Barriers Arthritis;Shortness of Breath;Deconditioning;Right Knee Replacement   Left shoulder surgery            6 Minute Walk:  6 Minute Walk     Row Name 03/03/21 1550         6 Minute Walk   Phase Initial     Distance 975 feet     Walk Time 6 minutes     # of Rest Breaks 0     MPH 1.85     METS 2.26     RPE 11     Perceived Dyspnea  1     VO2 Peak 7.91     Symptoms No     Resting HR 74 bpm     Resting BP 130/62     Resting Oxygen Saturation  97 %     Exercise Oxygen Saturation  during 6 min walk 92  %     Max Ex. HR 126 bpm     Max Ex. BP 172/70     2 Minute Post BP 132/60           Interval HR  1 Minute HR 100     2 Minute HR 111     3 Minute HR 117     4 Minute HR 119     5 Minute HR 121     6 Minute HR 126     2 Minute Post HR 88     Interval Heart Rate? Yes           Interval Oxygen     Interval Oxygen? Yes     Baseline Oxygen Saturation % 97 %     1 Minute Oxygen Saturation % 92 %     1 Minute Liters of Oxygen 0 L     2 Minute Oxygen Saturation % 92 %     2 Minute Liters of Oxygen 0 L     3 Minute Oxygen Saturation % 94 %     3 Minute Liters of Oxygen 0 L     4 Minute Oxygen Saturation % 93 %     4 Minute Liters of Oxygen 0 L     5 Minute Oxygen Saturation % 93 %     5 Minute Liters of Oxygen 0 L     6 Minute Oxygen Saturation % 93 %     6 Minute Liters of Oxygen 0 L     2 Minute Post Oxygen Saturation % 97 %     2 Minute Post Liters of Oxygen 0 L             Oxygen Initial Assessment:  Oxygen Initial Assessment - 03/03/21 1418       Home Oxygen   Home Oxygen Device None    Sleep Oxygen Prescription None    Home Exercise Oxygen Prescription None    Home Resting Oxygen Prescription None      Initial 6 min Walk   Oxygen Used None      Program Oxygen Prescription   Program Oxygen Prescription None      Intervention   Short Term Goals To learn and understand importance of monitoring SPO2 with pulse oximeter and demonstrate accurate use of the pulse oximeter.;To learn and understand importance of maintaining oxygen saturations>88%;To learn and demonstrate proper pursed lip breathing techniques or other breathing techniques. ;To learn and demonstrate proper use of respiratory medications    Long  Term Goals Verbalizes importance of monitoring SPO2 with pulse oximeter and return demonstration;Maintenance of O2 saturations>88%;Exhibits proper breathing techniques, such as pursed lip breathing or other method taught during program session;Compliance with  respiratory medication;Demonstrates proper use of MDI's             Oxygen Re-Evaluation:  Oxygen Re-Evaluation     Row Name 03/27/21 1646             Program Oxygen Prescription   Program Oxygen Prescription None               Home Oxygen     Home Oxygen Device None       Sleep Oxygen Prescription None       Home Exercise Oxygen Prescription None       Home Resting Oxygen Prescription None               Goals/Expected Outcomes     Short Term Goals To learn and understand importance of monitoring SPO2 with pulse oximeter and demonstrate accurate use of the pulse oximeter.;To learn and understand importance of maintaining oxygen saturations>88%;To learn and demonstrate proper pursed lip breathing techniques or other breathing techniques. ;  To learn and demonstrate proper use of respiratory medications       Long  Term Goals Verbalizes importance of monitoring SPO2 with pulse oximeter and return demonstration;Maintenance of O2 saturations>88%;Exhibits proper breathing techniques, such as pursed lip breathing or other method taught during program session;Compliance with respiratory medication;Demonstrates proper use of MDI's       Goals/Expected Outcomes compliance and understanding of monitoring oxygen saturation at home and importance of pursed lip breathing.               Oxygen Discharge (Final Oxygen Re-Evaluation):  Oxygen Re-Evaluation - 03/27/21 1646       Program Oxygen Prescription   Program Oxygen Prescription None      Home Oxygen   Home Oxygen Device None    Sleep Oxygen Prescription None    Home Exercise Oxygen Prescription None    Home Resting Oxygen Prescription None      Goals/Expected Outcomes   Short Term Goals To learn and understand importance of monitoring SPO2 with pulse oximeter and demonstrate accurate use of the pulse oximeter.;To learn and understand importance of maintaining oxygen saturations>88%;To learn and demonstrate proper pursed lip  breathing techniques or other breathing techniques. ;To learn and demonstrate proper use of respiratory medications    Long  Term Goals Verbalizes importance of monitoring SPO2 with pulse oximeter and return demonstration;Maintenance of O2 saturations>88%;Exhibits proper breathing techniques, such as pursed lip breathing or other method taught during program session;Compliance with respiratory medication;Demonstrates proper use of MDI's    Goals/Expected Outcomes compliance and understanding of monitoring oxygen saturation at home and importance of pursed lip breathing.             Initial Exercise Prescription:  Initial Exercise Prescription - 03/03/21 1500       Date of Initial Exercise RX and Referring Provider   Date 03/03/21    Referring Provider Dr. Loanne Drilling    Expected Discharge Date 05/04/21      NuStep   Level 1    SPM 70    Minutes 15      Track   Minutes 15      Prescription Details   Frequency (times per week) 2    Duration Progress to 30 minutes of continuous aerobic without signs/symptoms of physical distress      Intensity   THRR 40-80% of Max Heartrate 59-118    Ratings of Perceived Exertion 11-13    Perceived Dyspnea 0-4      Progression   Progression Continue to progress workloads to maintain intensity without signs/symptoms of physical distress.      Resistance Training   Training Prescription Yes    Weight Red bands    Reps 10-15             Perform Capillary Blood Glucose checks as needed.  Exercise Prescription Changes:   Exercise Prescription Changes     Row Name 03/07/21 1500 03/21/21 1500           Response to Exercise   Blood Pressure (Admit) 140/70 138/60      Blood Pressure (Exercise) 152/68 136/64      Blood Pressure (Exit) 130/62 114/62      Heart Rate (Admit) 79 bpm 70 bpm      Heart Rate (Exercise) 89 bpm 109 bpm      Heart Rate (Exit) 80 bpm 81 bpm      Oxygen Saturation (Admit) 99 % 98 %      Oxygen Saturation  (Exercise) 100 %  94 %      Oxygen Saturation (Exit) 97 % 98 %      Rating of Perceived Exertion (Exercise) 13 12      Perceived Dyspnea (Exercise) 1 2      Duration Continue with 30 min of aerobic exercise without signs/symptoms of physical distress. Continue with 30 min of aerobic exercise without signs/symptoms of physical distress.      Intensity Other (comment)  40-80% HR Max THRR unchanged             Progression      Progression Continue to progress workloads to maintain intensity without signs/symptoms of physical distress. Continue to progress workloads to maintain intensity without signs/symptoms of physical distress.             Resistance Training      Training Prescription Yes Yes      Weight Red bands Red bands      Reps 10-15 10-15      Time 10 Minutes 10 Minutes             NuStep      Level 1 3      SPM 70 80      Minutes 15 15      METs 1.6 1.9             Track      Laps 9 15      Minutes 15 15      METs 2.05 --              Exercise Comments:   Exercise Comments     Row Name 03/07/21 1510           Exercise Comments Patient completed first day of exercise and tolerated really well. She had no complaints or concerns during exercise. She was able to do 15 minutes on the Nustep without having to take a break and 15 minutes on the track. She was able to do all of the warm up-resistance bands, and cool down exercises standing and with no complaints. Will continue to monitor pt while exercising.                Exercise Goals and Review:   Exercise Goals     Row Name 03/03/21 1535             Exercise Goals   Increase Physical Activity Yes       Intervention Provide advice, education, support and counseling about physical activity/exercise needs.;Develop an individualized exercise prescription for aerobic and resistive training based on initial evaluation findings, risk stratification, comorbidities and participant's personal goals.        Expected Outcomes Short Term: Attend rehab on a regular basis to increase amount of physical activity.;Long Term: Add in home exercise to make exercise part of routine and to increase amount of physical activity.;Long Term: Exercising regularly at least 3-5 days a week.       Increase Strength and Stamina Yes       Intervention Provide advice, education, support and counseling about physical activity/exercise needs.;Develop an individualized exercise prescription for aerobic and resistive training based on initial evaluation findings, risk stratification, comorbidities and participant's personal goals.       Expected Outcomes Short Term: Increase workloads from initial exercise prescription for resistance, speed, and METs.;Short Term: Perform resistance training exercises routinely during rehab and add in resistance training at home;Long Term: Improve cardiorespiratory fitness, muscular endurance and strength as measured by increased METs and  functional capacity (6MWT)       Able to understand and use rate of perceived exertion (RPE) scale Yes       Intervention Provide education and explanation on how to use RPE scale       Expected Outcomes Long Term:  Able to use RPE to guide intensity level when exercising independently;Short Term: Able to use RPE daily in rehab to express subjective intensity level       Able to understand and use Dyspnea scale Yes       Intervention Provide education and explanation on how to use Dyspnea scale       Expected Outcomes Short Term: Able to use Dyspnea scale daily in rehab to express subjective sense of shortness of breath during exertion;Long Term: Able to use Dyspnea scale to guide intensity level when exercising independently       Knowledge and understanding of Target Heart Rate Range (THRR) Yes       Intervention Provide education and explanation of THRR including how the numbers were predicted and where they are located for reference       Expected Outcomes Short  Term: Able to state/look up THRR;Long Term: Able to use THRR to govern intensity when exercising independently;Short Term: Able to use daily as guideline for intensity in rehab       Understanding of Exercise Prescription Yes       Intervention Provide education, explanation, and written materials on patient's individual exercise prescription       Expected Outcomes Short Term: Able to explain program exercise prescription;Long Term: Able to explain home exercise prescription to exercise independently                Exercise Goals Re-Evaluation :  Exercise Goals Re-Evaluation     Row Name 03/27/21 1644             Exercise Goal Re-Evaluation   Exercise Goals Review Increase Physical Activity;Increase Strength and Stamina;Able to understand and use rate of perceived exertion (RPE) scale;Able to understand and use Dyspnea scale;Knowledge and understanding of Target Heart Rate Range (THRR);Understanding of Exercise Prescription       Comments Suzanne Conner has completed 5 exercise sessions and has tolerated well so far. In the short time she has been here she has already progressed her workload and METS on the Nustep and the track. She is exercising at 1.9 METS on the Nustep and 2.74 METS walking the track. Will continue to monitor and progress as she is able.       Expected Outcomes Through exercise at rehab and home, the patient will decrease shortness of breath with daily activities and feel confident in carrying out an exercise regimn at home.                Discharge Exercise Prescription (Final Exercise Prescription Changes):  Exercise Prescription Changes - 03/21/21 1500       Response to Exercise   Blood Pressure (Admit) 138/60    Blood Pressure (Exercise) 136/64    Blood Pressure (Exit) 114/62    Heart Rate (Admit) 70 bpm    Heart Rate (Exercise) 109 bpm    Heart Rate (Exit) 81 bpm    Oxygen Saturation (Admit) 98 %    Oxygen Saturation (Exercise) 94 %    Oxygen Saturation  (Exit) 98 %    Rating of Perceived Exertion (Exercise) 12    Perceived Dyspnea (Exercise) 2    Duration Continue with 30 min of aerobic exercise without  signs/symptoms of physical distress.    Intensity THRR unchanged      Progression   Progression Continue to progress workloads to maintain intensity without signs/symptoms of physical distress.      Resistance Training   Training Prescription Yes    Weight Red bands    Reps 10-15    Time 10 Minutes      NuStep   Level 3    SPM 80    Minutes 15    METs 1.9      Track   Laps 15    Minutes 15             Nutrition:  Target Goals: Understanding of nutrition guidelines, daily intake of sodium 1500mg , cholesterol 200mg , calories 30% from fat and 7% or less from saturated fats, daily to have 5 or more servings of fruits and vegetables.  Biometrics:  Pre Biometrics - 03/03/21 1535       Pre Biometrics   Grip Strength 27 kg              Nutrition Therapy Plan and Nutrition Goals:  Nutrition Therapy & Goals - 03/17/21 1516       Nutrition Therapy   Diet TLC      Personal Nutrition Goals   Nutrition Goal Pt to identify food quantities necessary to achieve weight loss of 6-24 lb at graduation from cardiac rehab.    Personal Goal #2 Pt to build a healthy plate including vegetables, fruits, whole grains, and low-fat dairy products in a heart healthy meal plan.      Intervention Plan   Intervention Prescribe, educate and counsel regarding individualized specific dietary modifications aiming towards targeted core components such as weight, hypertension, lipid management, diabetes, heart failure and other comorbidities.;Nutrition handout(s) given to patient.    Expected Outcomes Long Term Goal: Adherence to prescribed nutrition plan.;Short Term Goal: Understand basic principles of dietary content, such as calories, fat, sodium, cholesterol and nutrients.             Nutrition Assessments:  MEDIFICTS Score  Key: ?70 Need to make dietary changes  40-70 Heart Healthy Diet ? 40 Therapeutic Level Cholesterol Diet  Flowsheet Row PULMONARY REHAB OTHER RESPIRATORY from 03/16/2021 in [facility]  Picture Your Plate Total Score on Admission 65      Picture Your Plate Scores: <28 Unhealthy dietary pattern with much room for improvement. 41-50 Dietary pattern unlikely to meet recommendations for good health and room for improvement. 51-60 More healthful dietary pattern, with some room for improvement.  >60 Healthy dietary pattern, although there may be some specific behaviors that could be improved.    Nutrition Goals Re-Evaluation:  Nutrition Goals Re-Evaluation     Suzanne Conner Name 03/17/21 1516 03/27/21 1430           Goals   Current Weight 207 lb (93.9 kg) 208 lb 12.4 oz (94.7 kg)      Nutrition Goal -- Pt to identify food quantities necessary to achieve weight loss of 6-24 lb at graduation from cardiac rehab.             Personal Goal #2 Re-Evaluation      Personal Goal #2 -- Pt to build a healthy plate including vegetables, fruits, whole grains, and low-fat dairy products in a heart healthy meal plan.              Nutrition Goals Discharge (Final Nutrition Goals Re-Evaluation):  Nutrition Goals Re-Evaluation - 03/27/21 1430  Goals   Current Weight 208 lb 12.4 oz (94.7 kg)    Nutrition Goal Pt to identify food quantities necessary to achieve weight loss of 6-24 lb at graduation from cardiac rehab.      Personal Goal #2 Re-Evaluation   Personal Goal #2 Pt to build a healthy plate including vegetables, fruits, whole grains, and low-fat dairy products in a heart healthy meal plan.             Psychosocial: Target Goals: Acknowledge presence or absence of significant depression and/or stress, maximize coping skills, provide positive support system. Participant is able to verbalize types and ability to use techniques and skills needed for reducing  stress and depression.  Initial Review & Psychosocial Screening:  Initial Psych Review & Screening - 03/03/21 1558       Screening Interventions   Expected Outcomes Long Term Goal: Stressors or current issues are controlled or eliminated.;Long Term goal: The participant improves quality of Life and PHQ9 Scores as seen by post scores and/or verbalization of changes             Quality of Life Scores:  Scores of 19 and below usually indicate a poorer quality of life in these areas.  A difference of  2-3 points is a clinically meaningful difference.  A difference of 2-3 points in the total score of the Quality of Life Index has been associated with significant improvement in overall quality of life, self-image, physical symptoms, and general health in studies assessing change in quality of life.  PHQ-9: Recent Review Flowsheet Data     Depression screen [facility] 2/9 03/03/2021 06/27/2020 05/25/2020 04/20/2020 03/18/2020   Decreased Interest 0 0 1 0 1   Down, Depressed, Hopeless 1 1 1 1 2    PHQ - 2 Score 1 1 2 1 3    Altered sleeping 1 0 1 1 2    Tired, decreased energy 1 1 1 1 3    Change in appetite 0 1 2 1 1    Feeling bad or failure about yourself  1 0 1 0 1   Trouble concentrating 0 0 1 1 2    Moving slowly or fidgety/restless 0 0 0 0 1   Suicidal thoughts 0 0 0 0 0   PHQ-9 Score - 3 8 5 13    Difficult doing work/chores Somewhat difficult Not difficult at all Somewhat difficult Somewhat difficult Somewhat difficult      Interpretation of Total Score  Total Score Depression Severity:  1-4 = Minimal depression, 5-9 = Mild depression, 10-14 = Moderate depression, 15-19 = Moderately severe depression, 20-27 = Severe depression   Psychosocial Evaluation and Intervention:  Psychosocial Evaluation - 03/03/21 1558       Psychosocial Evaluation & Interventions   Interventions Encouraged to exercise with the program and follow exercise prescription    Comments Patient does show signs of  depressions and states she has a history of depression. She has been on depression medication in the past but is not currently on any medication. She attributes her depression to being stuck inside during the pandemic and her physical limitations since she was diagnosed with an asthma attack. She states she is not able to do things she wants and has been isolated at home during [place] because she is nervous to go out anywhere due to fear of getting COVID. She also lost her sister in 2019 and is still stuggling with that. We discussed counseling and she said she was considering it.    Expected Outcomes  For pt to learn and develop healthy coping skills to manage depression symptoms and to improve quality of life by participating in exercise program.    Continue Psychosocial Services  Follow up required by staff             Psychosocial Re-Evaluation:  Psychosocial Re-Evaluation     Suzanne Conner Name 03/27/21 1425             Psychosocial Re-Evaluation   Current issues with Current Depression;History of Depression       Comments Depression is stable at this time, she lost her sister in 2019.       Expected Outcomes For Suzanne Conner to remain positive and her depression stable.       Interventions Encouraged to attend Pulmonary Rehabilitation for the exercise;Relaxation education;Stress management education       Continue Psychosocial Services  Follow up required by staff                Psychosocial Discharge (Final Psychosocial Re-Evaluation):  Psychosocial Re-Evaluation - 03/27/21 1425       Psychosocial Re-Evaluation   Current issues with Current Depression;History of Depression    Comments Depression is stable at this time, she lost her sister in 2019.    Expected Outcomes For Suzanne Conner to remain positive and her depression stable.    Interventions Encouraged to attend Pulmonary Rehabilitation for the exercise;Relaxation education;Stress management education    Continue Psychosocial Services   Follow up required by staff             Education: Education Goals: Education classes will be provided on a weekly basis, covering required topics. Participant will state understanding/return demonstration of topics presented.  Learning Barriers/Preferences:  Learning Barriers/Preferences - 03/03/21 1420       Learning Barriers/Preferences   Learning Barriers Sight   Wears glasses   Learning Preferences Skilled Demonstration;Individual Instruction;Written Material             Education Topics: Risk Factor Reduction:  -Group instruction that is supported by a PowerPoint presentation. Instructor discusses the definition of a risk factor, different risk factors for pulmonary disease, and how the heart and lungs work together.     Nutrition for Pulmonary Patient:  -Group instruction provided by PowerPoint slides, verbal discussion, and written materials to support subject matter. The instructor gives an explanation and review of healthy diet recommendations, which includes a discussion on weight management, recommendations for fruit and vegetable consumption, as well as protein, fluid, caffeine, fiber, sodium, sugar, and alcohol. Tips for eating when patients are short of breath are discussed.   Pursed Lip Breathing:  -Group instruction that is supported by demonstration and informational handouts. Instructor discusses the benefits of pursed lip and diaphragmatic breathing and detailed demonstration on how to preform both.     Oxygen Safety:  -Group instruction provided by PowerPoint, verbal discussion, and written material to support subject matter. There is an overview of "What is Oxygen" and "Why do we need it".  Instructor also reviews how to create a safe environment for oxygen use, the importance of using oxygen as prescribed, and the risks of noncompliance. There is a brief discussion on traveling with oxygen and resources the patient may utilize.   Oxygen Equipment:   -Group instruction provided by [facility] Staff utilizing handouts, written materials, and equipment demonstrations.   Signs and Symptoms:  -Group instruction provided by written material and verbal discussion to support subject matter. Warning signs and symptoms of infection, stroke, and heart attack  are reviewed and when to call the physician/911 reinforced. Tips for preventing the spread of infection discussed.   Advanced Directives:  -Group instruction provided by verbal instruction and written material to support subject matter. Instructor reviews Advanced Directive laws and proper instruction for filling out document.   Pulmonary Video:  -Group video education that reviews the importance of medication and oxygen compliance, exercise, good nutrition, pulmonary hygiene, and pursed lip and diaphragmatic breathing for the pulmonary patient.   Exercise for the Pulmonary Patient:  -Group instruction that is supported by a PowerPoint presentation. Instructor discusses benefits of exercise, core components of exercise, frequency, duration, and intensity of an exercise routine, importance of utilizing pulse oximetry during exercise, safety while exercising, and options of places to exercise outside of rehab.     Pulmonary Medications:  -Verbally interactive group education provided by instructor with focus on inhaled medications and proper administration.   Anatomy and Physiology of the Respiratory System and Intimacy:  -Group instruction provided by PowerPoint, verbal discussion, and written material to support subject matter. Instructor reviews respiratory cycle and anatomical components of the respiratory system and their functions. Instructor also reviews differences in obstructive and restrictive respiratory diseases with examples of each. Intimacy, Sex, and Sexuality differences are reviewed with a discussion on how relationships can change when diagnosed with pulmonary disease. Common  sexual concerns are reviewed.   MD DAY -A group question and answer session with a medical doctor that allows participants to ask questions that relate to their pulmonary disease state.   OTHER EDUCATION -Group or individual verbal, written, or video instructions that support the educational goals of the pulmonary rehab program. [place] from 03/16/2021 in [place]  Date 03/16/21  [facility]]  Educator handout       Holiday Eating Survival Tips:  -Group instruction provided by PowerPoint slides, verbal discussion, and written materials to support subject matter. The instructor gives patients tips, tricks, and techniques to help them not only survive but enjoy the holidays despite the onslaught of food that accompanies the holidays.   Knowledge Questionnaire Score:  Knowledge Questionnaire Score - 03/03/21 1537       Knowledge Questionnaire Score   Pre Score 14/18             Core Components/Risk Factors/Patient Goals at Admission:  Personal Goals and Risk Factors at Admission - 03/03/21 1603       Core Components/Risk Factors/Patient Goals on Admission    Weight Management Weight Loss    Improve shortness of breath with ADL's Yes    Intervention Provide education, individualized exercise plan and daily activity instruction to help decrease symptoms of SOB with activities of daily living.    Expected Outcomes Short Term: Improve cardiorespiratory fitness to achieve a reduction of symptoms when performing ADLs;Long Term: Be able to perform more ADLs without symptoms or delay the onset of symptoms    Hypertension Yes    Intervention Provide education on lifestyle modifcations including regular physical activity/exercise, weight management, moderate sodium restriction and increased consumption of fresh fruit, vegetables, and low fat dairy, alcohol moderation, and smoking cessation.;Monitor  prescription use compliance.    Expected Outcomes Short Term: Continued assessment and intervention until BP is < 140/44mm HG in hypertensive participants. < 130/69mm HG in hypertensive participants with diabetes, heart failure or chronic kidney disease.;Long Term: Maintenance of blood pressure at goal levels.  Core Components/Risk Factors/Patient Goals Review:   Goals and Risk Factor Review     Row Name 03/27/21 1427             Core Components/Risk Factors/Patient Goals Review   Personal Goals Review Develop more efficient breathing techniques such as purse lipped breathing and diaphragmatic breathing and practicing self-pacing with activity.;Increase knowledge of respiratory medications and ability to use respiratory devices properly.;Improve shortness of breath with ADL's       Review Suzanne Conner has attended 5 exercise sessions, too early to see progression towards program goals.       Expected Outcomes See admission goals.                Core Components/Risk Factors/Patient Goals at Discharge (Final Review):   Goals and Risk Factor Review - 03/27/21 1427       Core Components/Risk Factors/Patient Goals Review   Personal Goals Review Develop more efficient breathing techniques such as purse lipped breathing and diaphragmatic breathing and practicing self-pacing with activity.;Increase knowledge of respiratory medications and ability to use respiratory devices properly.;Improve shortness of breath with ADL's    Review Suzanne Conner has attended 5 exercise sessions, too early to see progression towards program goals.    Expected Outcomes See admission goals.             ITP Comments:   Comments:  Suzanne Conner has completed 5 exercise session in Pulmonary rehab. Pt maintains good attendance. Pulmonary rehab staff will continue to monitor and reassess progress toward goals during her participation in Pulmonary Rehab.

## 2021-03-30 ENCOUNTER — Encounter (HOSPITAL_COMMUNITY)
Admission: RE | Admit: 2021-03-30 | Discharge: 2021-03-30 | Disposition: A | Discharge status: Home / Self Care | Payer: Medicare Other | Source: Ambulatory Visit | Attending: Pulmonary Disease | Admitting: Pulmonary Disease

## 2021-03-30 ENCOUNTER — Other Ambulatory Visit: Payer: Self-pay

## 2021-03-30 DIAGNOSIS — J4520 Mild intermittent asthma, uncomplicated: Secondary | ICD-10-CM | POA: Diagnosis not present

## 2021-03-30 NOTE — Progress Notes (Signed)
Daily Session Note  Patient Details  Name: Suzanne Conner MRN: [id number] Date of Birth: 05-23-1946 Referring Provider:   April Manson Pulmonary Rehab Walk Test from 03/03/2021 in [place]  Referring Provider Dr. Loanne Drilling       Encounter Date: 03/30/2021  Check In:  Session Check In - 03/30/21 1411       Check-In   Supervising physician immediately available to respond to emergencies [facility] immediately available    Physician(s) Dr. Sabino Gasser    Location [facility]    Staff Present [name], RN, BSN;Shantel Wesely Ysidro Evert, Cathleen Fears, MS, ACSM-CEP, Exercise Physiologist;Jessica Hassell Done, MS, ACSM-CEP, Exercise Physiologist    Virtual Visit No    Medication changes reported     No    Fall or balance concerns reported    No    Tobacco Cessation No Change    Warm-up and Cool-down Performed as group-led instruction    Resistance Training Performed Yes    VAD Patient? No    PAD/SET Patient? No      Pain Assessment   Currently in Pain? No/denies    Pain Score 0-No pain    Multiple Pain Sites No             Capillary Blood Glucose: No results found for this or any previous visit (from the past 24 hour(s)).    Social History   Tobacco Use  Smoking Status Never  Smokeless Tobacco Never    Goals Met:  Exercise tolerated well No report of cardiac concerns or symptoms Strength training completed today  Goals Unmet:  Not Applicable  Comments: Service time is from 1320 to 1430    Dr. Fransico Him is Medical Director for Cardiac Rehab at [facility].

## 2021-04-04 ENCOUNTER — Encounter (HOSPITAL_COMMUNITY)
Admission: RE | Admit: 2021-04-04 | Discharge: 2021-04-04 | Disposition: A | Discharge status: Home / Self Care | Payer: Medicare Other | Source: Ambulatory Visit | Attending: Pulmonary Disease | Admitting: Pulmonary Disease

## 2021-04-04 ENCOUNTER — Other Ambulatory Visit: Payer: Self-pay

## 2021-04-04 DIAGNOSIS — J4520 Mild intermittent asthma, uncomplicated: Secondary | ICD-10-CM

## 2021-04-04 NOTE — Progress Notes (Signed)
Daily Session Note  Patient Details  Name: Suzanne Conner MRN: [id number] Date of Birth: 06-22-1946 Referring Provider:   April Manson Pulmonary Rehab Walk Test from 03/03/2021 in [place]  Referring Provider Dr. Loanne Drilling       Encounter Date: 04/04/2021  Check In:  Session Check In - 04/04/21 1435       Check-In   Supervising physician immediately available to respond to emergencies [facility] immediately available    Physician(s) Dr. Sabino Gasser    [facility]    Staff Present [name], RN, BSN;Lisa Ysidro Evert, Cathleen Fears, MS, ACSM-CEP, Exercise Physiologist;Cherryl Babin Hassell Done, MS, ACSM-CEP, Exercise Physiologist    Virtual Visit No    Medication changes reported     No    Fall or balance concerns reported    No    Tobacco Cessation No Change    Warm-up and Cool-down Performed as group-led instruction    Resistance Training Performed Yes    VAD Patient? No    PAD/SET Patient? No      Pain Assessment   Currently in Pain? No/denies    Multiple Pain Sites No             Capillary Blood Glucose: No results found for this or any previous visit (from the past 24 hour(s)).   Exercise Prescription Changes - 04/04/21 1600       Response to Exercise   Blood Pressure (Admit) 136/70    Blood Pressure (Exercise) 136/72    Blood Pressure (Exit) 118/70    Heart Rate (Admit) 74 bpm    Heart Rate (Exercise) 111 bpm    Heart Rate (Exit) 69 bpm    Oxygen Saturation (Admit) 99 %    Oxygen Saturation (Exercise) 95 %    Oxygen Saturation (Exit) 98 %    Rating of Perceived Exertion (Exercise) 11    Perceived Dyspnea (Exercise) 1    Duration Continue with 30 min of aerobic exercise without signs/symptoms of physical distress.    Intensity THRR unchanged      Progression   Progression Continue to progress workloads to maintain intensity without signs/symptoms of physical distress.      Resistance Training    Training Prescription Yes    Weight Red bands    Reps 10-15    Time 10 Minutes      NuStep   Level 3    SPM 80    Minutes 15    METs 1.7      Track   Laps 15    Minutes 15    METs 2.74             Social History   Tobacco Use  Smoking Status Never  Smokeless Tobacco Never    Goals Met:  Proper associated with RPD/PD & O2 Sat [place] with exercise equipment Exercise tolerated well No report of cardiac concerns or symptoms Strength training completed today  Goals Unmet:  Not Applicable  Comments: Service time is from 1320 to 1435    Dr. Fransico Him is Medical Director for Cardiac Rehab at [facility].

## 2021-04-04 NOTE — Progress Notes (Signed)
I have reviewed a Home Exercise Prescription with Suzanne Conner.  Suzanne Conner is currently exercising at the [facility]. She does water aerobics 2x/week and also does the Nustep at least once a week. The patient was advised to continue water aerobics and other cardio, but to also add in resistance training at least 2 days a week for 30-45 minutes.  Suzanne Conner and I discussed how to progress their exercise prescription.  The patient stated that their goals were to increase stamina and lung capacity and to also lose weight. The patient stated that they understand the exercise prescription.  We reviewed exercise guidelines, target heart rate during exercise, RPE Scale, weather conditions, use of rescue inhaler, endpoints for exercise, warmup and cool down.  Patient is encouraged to come to me with any questions. I will continue to follow up with the patient to assist them with progression and safety.    [name] MS, ACSM CEP 3:49 PM 04/04/2021

## 2021-04-06 ENCOUNTER — Encounter (HOSPITAL_COMMUNITY): Payer: Medicare Other

## 2021-04-06 ENCOUNTER — Telehealth (HOSPITAL_COMMUNITY): Payer: Self-pay | Admitting: *Deleted

## 2021-04-06 NOTE — Telephone Encounter (Signed)
Called Cannon to inform her that we would not be having the PR afternoon class on Tuesday 8/2 due to a staff required class. Explained to her that we will be adding a extra class on to the end of her time to make up for this. She voices understanding.

## 2021-04-11 ENCOUNTER — Encounter (HOSPITAL_COMMUNITY): Payer: Medicare Other

## 2021-04-13 ENCOUNTER — Encounter (HOSPITAL_COMMUNITY)
Admission: RE | Admit: 2021-04-13 | Discharge: 2021-04-13 | Disposition: A | Discharge status: Home / Self Care | Payer: Medicare Other | Source: Ambulatory Visit | Attending: Pulmonary Disease | Admitting: Pulmonary Disease

## 2021-04-13 ENCOUNTER — Other Ambulatory Visit: Payer: Self-pay

## 2021-04-13 DIAGNOSIS — J4520 Mild intermittent asthma, uncomplicated: Secondary | ICD-10-CM | POA: Diagnosis not present

## 2021-04-13 NOTE — Progress Notes (Signed)
Daily Session Note  Patient Details  Name: CLYDIA NIEVES MRN: [id number] Date of Birth: 1946/04/27 Referring Provider:   April Manson Pulmonary Rehab Walk Test from 03/03/2021 in [place]  Referring Provider Dr. Loanne Drilling       Encounter Date: 04/13/2021  Check In:  Session Check In - 04/13/21 1444       Check-In   Supervising physician immediately available to respond to emergencies [facility] immediately available    Physician(s) Dr. Broadus John    [facility]    Staff Present [name], RN, Milus Glazier, MS, ACSM-CEP, CCRP, Exercise Physiologist;Carlette Wilber [name], RN, BSN;Ramon Dredge, RN, MHA;Lisa Ysidro Evert, RN    Virtual Visit No    Medication changes reported     No    Fall or balance concerns reported    No    Tobacco Cessation No Change    Warm-up and Cool-down Performed as group-led instruction    Resistance Training Performed Yes    VAD Patient? No    PAD/SET Patient? No      Pain Assessment   Currently in Pain? No/denies    Multiple Pain Sites No             Capillary Blood Glucose: No results found for this or any previous visit (from the past 24 hour(s)).    Social History   Tobacco Use  Smoking Status Never  Smokeless Tobacco Never    Goals Met:  Proper associated with RPD/PD & O2 Sat Exercise tolerated well No report of cardiac concerns or symptoms Strength training completed today  Goals Unmet:  Not Applicable  Comments: Service time is from 1320 to 1430.    Dr. Fransico Him is Medical Director for Cardiac Rehab at [facility].

## 2021-04-14 DIAGNOSIS — L218 Other seborrheic dermatitis: Secondary | ICD-10-CM | POA: Diagnosis not present

## 2021-04-14 DIAGNOSIS — L820 Inflamed seborrheic keratosis: Secondary | ICD-10-CM | POA: Diagnosis not present

## 2021-04-18 ENCOUNTER — Other Ambulatory Visit: Payer: Self-pay

## 2021-04-18 ENCOUNTER — Encounter (HOSPITAL_COMMUNITY)
Admission: RE | Admit: 2021-04-18 | Discharge: 2021-04-18 | Disposition: A | Discharge status: Home / Self Care | Payer: Medicare Other | Source: Ambulatory Visit | Attending: Pulmonary Disease | Admitting: Pulmonary Disease

## 2021-04-18 DIAGNOSIS — J4520 Mild intermittent asthma, uncomplicated: Secondary | ICD-10-CM

## 2021-04-18 NOTE — Progress Notes (Signed)
Daily Session Note  Patient Details  Name: Suzanne Conner MRN: [id number] Date of Birth: Feb 26, 1946 Referring Provider:   April Manson Pulmonary Rehab Walk Test from 03/03/2021 in [place]  Referring Provider Dr. Loanne Drilling       Encounter Date: 04/18/2021  Check In:  Session Check In - 04/18/21 1504       Check-In   Supervising physician immediately available to respond to emergencies [facility] immediately available    Physician(s) Dr. Broadus John    [facility]    Staff Present [name], RN, BSN;Carlette Wilber [name], RN, BSN;Lisa Ysidro Evert, RN;Jessica Hassell Done, MS, ACSM-CEP, Exercise Physiologist    Virtual Visit No    Medication changes reported     No    Fall or balance concerns reported    No    Tobacco Cessation No Change    Warm-up and Cool-down Performed as group-led instruction    Resistance Training Performed Yes    VAD Patient? No    PAD/SET Patient? No      Pain Assessment   Currently in Pain? No/denies    Multiple Pain Sites No             Capillary Blood Glucose: No results found for this or any previous visit (from the past 24 hour(s)).   Exercise Prescription Changes - 04/18/21 1500       Response to Exercise   Blood Pressure (Admit) 142/66    Blood Pressure (Exercise) 132/56    Blood Pressure (Exit) 114/60    Heart Rate (Admit) 79 bpm    Heart Rate (Exercise) 109 bpm    Heart Rate (Exit) 80 bpm    Oxygen Saturation (Admit) 96 %    Oxygen Saturation (Exercise) 95 %    Oxygen Saturation (Exit) 98 %    Rating of Perceived Exertion (Exercise) 11    Perceived Dyspnea (Exercise) 1    Duration Continue with 30 min of aerobic exercise without signs/symptoms of physical distress.    Intensity THRR unchanged      Progression   Progression Continue to progress workloads to maintain intensity without signs/symptoms of physical distress.      Resistance Training   Training Prescription Yes     Weight Red bands    Reps 10-15    Time 10 Minutes      NuStep   Level 4    SPM 80    Minutes 15    METs 2.3      Track   Laps 14    Minutes 15             Social History   Tobacco Use  Smoking Status Never  Smokeless Tobacco Never    Goals Met:  Proper associated with RPD/PD & O2 Sat Exercise tolerated well No report of cardiac concerns or symptoms Strength training completed today  Goals Unmet:  Not Applicable  Comments: Service time is from 1319 to 1445.    Dr. Fransico Him is Medical Director for Cardiac Rehab at [facility]

## 2021-04-20 ENCOUNTER — Telehealth: Payer: Self-pay | Admitting: Family Medicine

## 2021-04-20 ENCOUNTER — Encounter (HOSPITAL_COMMUNITY): Payer: Medicare Other

## 2021-04-20 NOTE — Telephone Encounter (Signed)
Spoke to patient to schedule Medicare Annual Wellness Visit (AWV) either virtually or in office. She is out of town death in family  she will call back to schedule next week  Left both  my jabber number [phone] and office number    Last AWV 03/18/20  please schedule at anytime with [facility] or 2   This should be a 45 minute visit.

## 2021-04-24 ENCOUNTER — Encounter: Payer: Self-pay | Admitting: Pulmonary Disease

## 2021-04-25 ENCOUNTER — Encounter (HOSPITAL_COMMUNITY)
Admission: RE | Admit: 2021-04-25 | Discharge: 2021-04-25 | Disposition: A | Discharge status: Home / Self Care | Payer: Medicare Other | Source: Ambulatory Visit | Attending: Pulmonary Disease | Admitting: Pulmonary Disease

## 2021-04-25 ENCOUNTER — Other Ambulatory Visit: Payer: Self-pay

## 2021-04-25 DIAGNOSIS — J4520 Mild intermittent asthma, uncomplicated: Secondary | ICD-10-CM | POA: Diagnosis not present

## 2021-04-25 NOTE — Progress Notes (Signed)
Daily Session Note  Patient Details  Name: Suzanne Conner MRN: [id number] Date of Birth: November 25, 1945 Referring Provider:   April Manson Pulmonary Rehab Walk Test from 03/03/2021 in [place]  Referring Provider Dr. Loanne Drilling       Encounter Date: 04/25/2021  Check In:  Session Check In - 04/25/21 1434       Check-In   Supervising physician immediately available to respond to emergencies [facility] immediately available    Physician(s) Dr. Alfredia Ferguson    [facility]    Staff Present Lesly Rubenstein, MS, ACSM-CEP, CCRP, Exercise Physiologist;Tuyet Bader Ysidro Evert, RN;Joan Leonia Reeves, RN, Quentin Ore, MS, ACSM-CEP, Exercise Physiologist    Virtual Visit No    Medication changes reported     No    Fall or balance concerns reported    No    Tobacco Cessation No Change    Warm-up and Cool-down Performed as group-led instruction    Resistance Training Performed Yes    VAD Patient? No    PAD/SET Patient? No      Pain Assessment   Currently in Pain? No/denies    Multiple Pain Sites No             Capillary Blood Glucose: No results found for this or any previous visit (from the past 24 hour(s)).    Social History   Tobacco Use  Smoking Status Never  Smokeless Tobacco Never    Goals Met:  Exercise tolerated well No report of cardiac concerns or symptoms Strength training completed today  Goals Unmet:  Not Applicable  Comments: Service time is from 1315 to 1430    Dr. Fransico Him is Medical Director for Cardiac Rehab at [facility].

## 2021-04-25 NOTE — Progress Notes (Signed)
Pulmonary Individual Treatment Plan  Patient Details  Name: Suzanne Conner MRN: [id number] Date of Birth: 05/19/1946 Referring Provider:   April Manson Pulmonary Rehab Walk Test from 03/03/2021 in [place]  Referring Provider Dr. Loanne Drilling       Initial Encounter Date:  Flowsheet Row Pulmonary Rehab Walk Test from 03/03/2021 in [place]  Date 03/03/21       Visit Diagnosis: Mild intermittent asthma without complication  Patient's Home Medications on Admission:   Current Outpatient Medications:    albuterol (VENTOLIN HFA) 108 (90 Base) MCG/ACT inhaler, Inhale 2 puffs into the lungs every 6 (six) hours as needed for wheezing or shortness of breath., Disp: 3 each, Rfl: 2   aspirin EC 81 MG tablet, Take 81 mg by mouth daily., Disp: , Rfl:    Cholecalciferol (VITAMIN D) 2000 units CAPS, Take 2,000 Units by mouth daily., Disp: , Rfl:    clindamycin (CLEOCIN) 300 MG capsule, Take 300 mg by mouth 2 (two) times daily. Prior to dental appointments, Disp: , Rfl:    Clobetasol Propionate 0.05 % shampoo, Use a small amount on the affected areas twice a day as needed., Disp: 118 mL, Rfl: 0   fluticasone-salmeterol (ADVAIR HFA) 230-21 MCG/ACT inhaler, Inhale 2 puffs into the lungs 2 (two) times daily., Disp: 3 each, Rfl: 3   hydrochlorothiazide (HYDRODIURIL) 12.5 MG tablet, TAKE 1 TABLET DAILY, Disp: 90 tablet, Rfl: 3   hydrocortisone valerate cream (WESTCORT) 0.2 %, Apply 1 application topically 2 (two) times daily., Disp: 45 g, Rfl: 0   latanoprost (XALATAN) 0.005 % ophthalmic solution, Apply to eye., Disp: , Rfl:    Melatonin 10 MG TABS, Take 1 tablet by mouth at bedtime as needed., Disp: , Rfl:    metoprolol tartrate (LOPRESSOR) 25 MG tablet, TAKE 1 TABLET TWICE A DAY, Disp: 180 tablet, Rfl: 3   Multiple Vitamins-Minerals (CENTRUM SILVER PO), Take 1 tablet by mouth daily. , Disp: , Rfl:    omeprazole (PRILOSEC) 40 MG capsule,  TAKE 1 CAPSULE TWICE A DAY, Disp: 180 capsule, Rfl: 3   Probiotic Product (TRUNATURE DIGESTIVE PROBIOTIC) CAPS, Take 1 capsule by mouth daily. , Disp: , Rfl:   Past Medical History: Past Medical History:  Diagnosis Date   Arthritis    "left hip; right wrist" (06/12/2018)   Asthma    Depression    Family history of polyps in the colon    GERD (gastroesophageal reflux disease)    Glaucoma    Hypertension    Type 2 diabetes, diet controlled ([place])     Tobacco Use: Social History   Tobacco Use  Smoking Status Never  Smokeless Tobacco Never    Labs: Recent Review Flowsheet Data     Labs for ITP Cardiac and Pulmonary Rehab Latest Ref Rng & Units 05/30/2018 06/17/2019 03/18/2020   Cholestrol <200 mg/dL - 120 135   LDLCALC mg/dL (calc) - 63 76   HDL > OR = 50 mg/dL - 41.10 44(L)   Trlycerides <150 mg/dL - 80.0 74   Hemoglobin A1c <5.7 % of total Hgb 5.7(H) 6.0 5.6       Capillary Blood Glucose: Lab Results  Component Value Date   GLUCAP 168 (H) 06/12/2018   GLUCAP 115 (H) 06/12/2018   GLUCAP 92 06/12/2018   GLUCAP 91 06/12/2018   GLUCAP 95 05/30/2018     Pulmonary Assessment Scores:  Pulmonary Assessment Scores     Row Name 03/03/21 1536  ADL UCSD   ADL Phase Entry     SOB Score total 17           CAT Score   CAT Score 11           mMRC Score   mMRC Score 2             UCSD: Self-administered rating of dyspnea associated with activities of daily living (ADLs) 6-point scale (0 = "not at all" to 5 = "maximal or unable to do because of breathlessness")  Scoring Scores range from 0 to 120.  Minimally important difference is 5 units  CAT: CAT can identify the health impairment of COPD patients and is better correlated with disease progression.  CAT has a scoring range of zero to 40. The CAT score is classified into four groups of low (less than 10), medium (10 - 20), high (21-30) and very high (31-40) based on the impact level of disease on health  status. A CAT score over 10 suggests significant symptoms.  A worsening CAT score could be explained by an exacerbation, poor medication adherence, poor inhaler technique, or progression of COPD or comorbid conditions.  CAT MCID is 2 points  mMRC: mMRC (Modified Medical Research Council) Dyspnea Scale is used to assess the degree of baseline functional disability in patients of respiratory disease due to dyspnea. No minimal important difference is established. A decrease in score of 1 point or greater is considered a positive change.   Pulmonary Function Assessment:  Pulmonary Function Assessment - 03/03/21 1418       Breath   Shortness of Breath Yes;Limiting activity;Panic with Shortness of Breath;Fear of Shortness of Breath             Exercise Target Goals: Exercise Program Goal: Individual exercise prescription set using results from initial 6 min walk test and THRR while considering  patient's activity barriers and safety.   Exercise Prescription Goal: Initial exercise prescription builds to 30-45 minutes a day of aerobic activity, 2-3 days per week.  Home exercise guidelines will be given to patient during program as part of exercise prescription that the participant will acknowledge.  Activity Barriers & Risk Stratification:  Activity Barriers & Cardiac Risk Stratification - 03/03/21 1417       Activity Barriers & Cardiac Risk Stratification   Activity Barriers Arthritis;Shortness of Breath;Deconditioning;Right Knee Replacement   Left shoulder surgery            6 Minute Walk:  6 Minute Walk     Row Name 03/03/21 1550         6 Minute Walk   Phase Initial     Distance 975 feet     Walk Time 6 minutes     # of Rest Breaks 0     MPH 1.85     METS 2.26     RPE 11     Perceived Dyspnea  1     VO2 Peak 7.91     Symptoms No     Resting HR 74 bpm     Resting BP 130/62     Resting Oxygen Saturation  97 %     Exercise Oxygen Saturation  during 6 min walk 92  %     Max Ex. HR 126 bpm     Max Ex. BP 172/70     2 Minute Post BP 132/60           Interval HR   1 Minute HR 100  2 Minute HR 111     3 Minute HR 117     4 Minute HR 119     5 Minute HR 121     6 Minute HR 126     2 Minute Post HR 88     Interval Heart Rate? Yes           Interval Oxygen   Interval Oxygen? Yes     Baseline Oxygen Saturation % 97 %     1 Minute Oxygen Saturation % 92 %     1 Minute Liters of Oxygen 0 L     2 Minute Oxygen Saturation % 92 %     2 Minute Liters of Oxygen 0 L     3 Minute Oxygen Saturation % 94 %     3 Minute Liters of Oxygen 0 L     4 Minute Oxygen Saturation % 93 %     4 Minute Liters of Oxygen 0 L     5 Minute Oxygen Saturation % 93 %     5 Minute Liters of Oxygen 0 L     6 Minute Oxygen Saturation % 93 %     6 Minute Liters of Oxygen 0 L     2 Minute Post Oxygen Saturation % 97 %     2 Minute Post Liters of Oxygen 0 L              Oxygen Initial Assessment:  Oxygen Initial Assessment - 03/03/21 1418       Home Oxygen   Home Oxygen Device None    Sleep Oxygen Prescription None    Home Exercise Oxygen Prescription None    Home Resting Oxygen Prescription None      Initial 6 min Walk   Oxygen Used None      Program Oxygen Prescription   Program Oxygen Prescription None      Intervention   Short Term Goals To learn and understand importance of monitoring SPO2 with pulse oximeter and demonstrate accurate use of the pulse oximeter.;To learn and understand importance of maintaining oxygen saturations>88%;To learn and demonstrate proper pursed lip breathing techniques or other breathing techniques. ;To learn and demonstrate proper use of respiratory medications    Long  Term Goals Verbalizes importance of monitoring SPO2 with pulse oximeter and return demonstration;Maintenance of O2 saturations>88%;Exhibits proper breathing techniques, such as pursed lip breathing or other method taught during program session;Compliance with  respiratory medication;Demonstrates proper use of MDI's             Oxygen Re-Evaluation:  Oxygen Re-Evaluation     Row Name 03/27/21 1646 04/25/21 0849           Program Oxygen Prescription   Program Oxygen Prescription None None             Home Oxygen   Home Oxygen Device None None      Sleep Oxygen Prescription None None      Home Exercise Oxygen Prescription None --      Home Resting Oxygen Prescription None None      Compliance with Home Oxygen Use -- Yes             Goals/Expected Outcomes   Short Term Goals To learn and understand importance of monitoring SPO2 with pulse oximeter and demonstrate accurate use of the pulse oximeter.;To learn and understand importance of maintaining oxygen saturations>88%;To learn and demonstrate proper pursed lip breathing techniques or other breathing techniques. ;To learn and  demonstrate proper use of respiratory medications --      Long  Term Goals Verbalizes importance of monitoring SPO2 with pulse oximeter and return demonstration;Maintenance of O2 saturations>88%;Exhibits proper breathing techniques, such as pursed lip breathing or other method taught during program session;Compliance with respiratory medication;Demonstrates proper use of MDI's --      Comments -- No supplemental oxygen required.      Goals/Expected Outcomes compliance and understanding of monitoring oxygen saturation at home and importance of pursed lip breathing. --               Oxygen Discharge (Final Oxygen Re-Evaluation):  Oxygen Re-Evaluation - 04/25/21 0849       Program Oxygen Prescription   Program Oxygen Prescription None      Home Oxygen   Home Oxygen Device None    Sleep Oxygen Prescription None    Home Resting Oxygen Prescription None    Compliance with Home Oxygen Use Yes      Goals/Expected Outcomes   Comments No supplemental oxygen required.             Initial Exercise Prescription:  Initial Exercise Prescription - 03/03/21  1500       Date of Initial Exercise RX and Referring Provider   Date 03/03/21    Referring Provider Dr. Loanne Drilling    Expected Discharge Date 05/04/21      NuStep   Level 1    SPM 70    Minutes 15      Track   Minutes 15      Prescription Details   Frequency (times per week) 2    Duration Progress to 30 minutes of continuous aerobic without signs/symptoms of physical distress      Intensity   THRR 40-80% of Max Heartrate 59-118    Ratings of Perceived Exertion 11-13    Perceived Dyspnea 0-4      Progression   Progression Continue to progress workloads to maintain intensity without signs/symptoms of physical distress.      Resistance Training   Training Prescription Yes    Weight Red bands    Reps 10-15             Perform Capillary Blood Glucose checks as needed.  Exercise Prescription Changes:   Exercise Prescription Changes     Row Name 03/07/21 1500 03/21/21 1500 04/04/21 1500 04/04/21 1600 04/18/21 1500     Response to Exercise   Blood Pressure (Admit) 140/70 138/60 -- 136/70 142/66   Blood Pressure (Exercise) 152/68 136/64 -- 136/72 132/56   Blood Pressure (Exit) 130/62 114/62 -- 118/70 114/60   Heart Rate (Admit) 79 bpm 70 bpm -- 74 bpm 79 bpm   Heart Rate (Exercise) 89 bpm 109 bpm -- 111 bpm 109 bpm   Heart Rate (Exit) 80 bpm 81 bpm -- 69 bpm 80 bpm   Oxygen Saturation (Admit) 99 % 98 % -- 99 % 96 %   Oxygen Saturation (Exercise) 100 % 94 % -- 95 % 95 %   Oxygen Saturation (Exit) 97 % 98 % -- 98 % 98 %   Rating of Perceived Exertion (Exercise) 13 12 -- 11 11   Perceived Dyspnea (Exercise) 1 2 -- 1 1   Duration Continue with 30 min of aerobic exercise without signs/symptoms of physical distress. Continue with 30 min of aerobic exercise without signs/symptoms of physical distress. -- Continue with 30 min of aerobic exercise without signs/symptoms of physical distress. Continue with 30 min of aerobic exercise without  signs/symptoms of physical distress.    Intensity Other (comment)  40-80% HR Max THRR unchanged -- THRR unchanged THRR unchanged     Progression   Progression Continue to progress workloads to maintain intensity without signs/symptoms of physical distress. Continue to progress workloads to maintain intensity without signs/symptoms of physical distress. -- Continue to progress workloads to maintain intensity without signs/symptoms of physical distress. Continue to progress workloads to maintain intensity without signs/symptoms of physical distress.     Resistance Training   Training Prescription Yes Yes -- Yes Yes   Weight Red bands Red bands -- Red bands Red bands   Reps 10-15 10-15 -- 10-15 10-15   Time 10 Minutes 10 Minutes -- 10 Minutes 10 Minutes     NuStep   Level 1 3 -- 3 4   SPM 70 80 -- 80 80   Minutes 15 15 -- 15 15   METs 1.6 1.9 -- 1.7 2.3     Track   Laps 9 15 -- 15 14   Minutes 15 15 -- 15 15   METs 2.05 -- -- 2.74 --     Home Exercise Plan   Plans to continue exercise at -- -- [employer] (comment)  [employer] -- --   Frequency -- -- Add 3 additional days to program exercise sessions. -- --   Initial Home Exercises Provided -- -- 04/04/21 -- --            Exercise Comments:   Exercise Comments     Row Name 03/07/21 1510 04/04/21 1531         Exercise Comments Patient completed first day of exercise and tolerated really well. She had no complaints or concerns during exercise. She was able to do 15 minutes on the Nustep without having to take a break and 15 minutes on the track. She was able to do all of the warm up-resistance bands, and cool down exercises standing and with no complaints. Will continue to monitor pt while exercising. Completed home exercise with pt. She exercises at the [facility] She does water aerobics twice a week and [facility] We discussed ways to progress exercise and to add in resistance training exercises. Pt verbalized understanding. Will  continue to follow up on home exercise prescription.               Exercise Goals and Review:   Exercise Goals     Row Name 03/03/21 1535 04/25/21 0956           Exercise Goals   Increase Physical Activity Yes Yes      Intervention Provide advice, education, support and counseling about physical activity/exercise needs.;Develop an individualized exercise prescription for aerobic and resistive training based on initial evaluation findings, risk stratification, comorbidities and participant's personal goals. Provide advice, education, support and counseling about physical activity/exercise needs.;Develop an individualized exercise prescription for aerobic and resistive training based on initial evaluation findings, risk stratification, comorbidities and participant's personal goals.      Expected Outcomes Short Term: Attend rehab on a regular basis to increase amount of physical activity.;Long Term: Add in home exercise to make exercise part of routine and to increase amount of physical activity.;Long Term: Exercising regularly at least 3-5 days a week. Short Term: Attend rehab on a regular basis to increase amount of physical activity.;Long Term: Add in home exercise to make exercise part of routine and to increase amount of physical activity.;Long Term: Exercising  regularly at least 3-5 days a week.      Increase Strength and Stamina Yes Yes      Intervention Provide advice, education, support and counseling about physical activity/exercise needs.;Develop an individualized exercise prescription for aerobic and resistive training based on initial evaluation findings, risk stratification, comorbidities and participant's personal goals. Provide advice, education, support and counseling about physical activity/exercise needs.;Develop an individualized exercise prescription for aerobic and resistive training based on initial evaluation findings, risk stratification, comorbidities and participant's  personal goals.      Expected Outcomes Short Term: Increase workloads from initial exercise prescription for resistance, speed, and METs.;Short Term: Perform resistance training exercises routinely during rehab and add in resistance training at home;Long Term: Improve cardiorespiratory fitness, muscular endurance and strength as measured by increased METs and functional capacity (6MWT) Short Term: Increase workloads from initial exercise prescription for resistance, speed, and METs.;Short Term: Perform resistance training exercises routinely during rehab and add in resistance training at home;Long Term: Improve cardiorespiratory fitness, muscular endurance and strength as measured by increased METs and functional capacity (6MWT)      Able to understand and use rate of perceived exertion (RPE) scale Yes Yes      Intervention Provide education and explanation on how to use RPE scale Provide education and explanation on how to use RPE scale      Expected Outcomes Long Term:  Able to use RPE to guide intensity level when exercising independently;Short Term: Able to use RPE daily in rehab to express subjective intensity level Long Term:  Able to use RPE to guide intensity level when exercising independently;Short Term: Able to use RPE daily in rehab to express subjective intensity level      Able to understand and use Dyspnea scale Yes Yes      Intervention Provide education and explanation on how to use Dyspnea scale Provide education and explanation on how to use Dyspnea scale      Expected Outcomes Short Term: Able to use Dyspnea scale daily in rehab to express subjective sense of shortness of breath during exertion;Long Term: Able to use Dyspnea scale to guide intensity level when exercising independently Short Term: Able to use Dyspnea scale daily in rehab to express subjective sense of shortness of breath during exertion;Long Term: Able to use Dyspnea scale to guide intensity level when exercising  independently      Knowledge and understanding of Target Heart Rate Range (THRR) Yes Yes      Intervention Provide education and explanation of THRR including how the numbers were predicted and where they are located for reference Provide education and explanation of THRR including how the numbers were predicted and where they are located for reference      Expected Outcomes Short Term: Able to state/look up THRR;Long Term: Able to use THRR to govern intensity when exercising independently;Short Term: Able to use daily as guideline for intensity in rehab Short Term: Able to state/look up THRR;Long Term: Able to use THRR to govern intensity when exercising independently;Short Term: Able to use daily as guideline for intensity in rehab      Understanding of Exercise Prescription Yes Yes      Intervention Provide education, explanation, and written materials on patient's individual exercise prescription Provide education, explanation, and written materials on patient's individual exercise prescription      Expected Outcomes Short Term: Able to explain program exercise prescription;Long Term: Able to explain home exercise prescription to exercise independently Short Term: Able to explain program exercise prescription;Long  Term: Able to explain home exercise prescription to exercise independently               Exercise Goals Re-Evaluation :  Exercise Goals Re-Evaluation     Row Name 03/27/21 1644 04/25/21 0956           Exercise Goal Re-Evaluation   Exercise Goals Review Increase Physical Activity;Increase Strength and Stamina;Able to understand and use rate of perceived exertion (RPE) scale;Able to understand and use Dyspnea scale;Knowledge and understanding of Target Heart Rate Range (THRR);Understanding of Exercise Prescription Increase Physical Activity;Increase Strength and Stamina;Able to understand and use rate of perceived exertion (RPE) scale;Able to understand and use Dyspnea  scale;Knowledge and understanding of Target Heart Rate Range (THRR);Understanding of Exercise Prescription      Comments Suzanne Conner has completed 5 exercise sessions and has tolerated well so far. In the short time she has been here she has already progressed her workload and METS on the Nustep and the track. She is exercising at 1.9 METS on the Nustep and 2.74 METS walking the track. Will continue to monitor and progress as she is able. Suzanne Conner has completed 10 exercise sessions and has tolerated well. She continues to progress on the Nustep and track. She averages 2.3 METs on the Nustep and 3.09 METs on the track. Suzanne Conner performs the warm up and resistance exercises/ cool down standing without limitations. Will continue to monitor and progress as she is able.      Expected Outcomes Through exercise at rehab and home, the patient will decrease shortness of breath with daily activities and feel confident in carrying out an exercise regimn at home. Through exercise at rehab and home, the patient will decrease shortness of breath with daily activities and feel confident in carrying out an exercise regimn at home.               Discharge Exercise Prescription (Final Exercise Prescription Changes):  Exercise Prescription Changes - 04/18/21 1500       Response to Exercise   Blood Pressure (Admit) 142/66    Blood Pressure (Exercise) 132/56    Blood Pressure (Exit) 114/60    Heart Rate (Admit) 79 bpm    Heart Rate (Exercise) 109 bpm    Heart Rate (Exit) 80 bpm    Oxygen Saturation (Admit) 96 %    Oxygen Saturation (Exercise) 95 %    Oxygen Saturation (Exit) 98 %    Rating of Perceived Exertion (Exercise) 11    Perceived Dyspnea (Exercise) 1    Duration Continue with 30 min of aerobic exercise without signs/symptoms of physical distress.    Intensity THRR unchanged      Progression   Progression Continue to progress workloads to maintain intensity without signs/symptoms of physical distress.       Resistance Training   Training Prescription Yes    Weight Red bands    Reps 10-15    Time 10 Minutes      NuStep   Level 4    SPM 80    Minutes 15    METs 2.3      Track   Laps 14    Minutes 15             Nutrition:  Target Goals: Understanding of nutrition guidelines, daily intake of sodium '1500mg'$ , cholesterol '200mg'$ , calories 30% from fat and 7% or less from saturated fats, daily to have 5 or more servings of fruits and vegetables.  Biometrics:  Pre Biometrics - 03/03/21 1535  Pre Biometrics   Grip Strength 27 kg              Nutrition Therapy Plan and Nutrition Goals:  Nutrition Therapy & Goals - 03/17/21 1516       Nutrition Therapy   Diet TLC      Personal Nutrition Goals   Nutrition Goal Pt to identify food quantities necessary to achieve weight loss of 6-24 lb at graduation from cardiac rehab.    Personal Goal #2 Pt to build a healthy plate including vegetables, fruits, whole grains, and low-fat dairy products in a heart healthy meal plan.      Intervention Plan   Intervention Prescribe, educate and counsel regarding individualized specific dietary modifications aiming towards targeted core components such as weight, hypertension, lipid management, diabetes, heart failure and other comorbidities.;Nutrition handout(s) given to patient.    Expected Outcomes Long Term Goal: Adherence to prescribed nutrition plan.;Short Term Goal: Understand basic principles of dietary content, such as calories, fat, sodium, cholesterol and nutrients.             Nutrition Assessments:  MEDIFICTS Score Key: ?70 Need to make dietary changes  40-70 Heart Healthy Diet ? 40 Therapeutic Level Cholesterol Diet  Flowsheet Row PULMONARY REHAB OTHER RESPIRATORY from 03/16/2021 in [facility]  Picture Your Plate Total Score on Admission 65      Picture Your Plate Scores: [id number] Unhealthy dietary pattern with much room for  improvement. 41-50 Dietary pattern unlikely to meet recommendations for good health and room for improvement. 51-60 More healthful dietary pattern, with some room for improvement.  >60 Healthy dietary pattern, although there may be some specific behaviors that could be improved.    Nutrition Goals Re-Evaluation:  Nutrition Goals Re-Evaluation     [place] Name 03/17/21 1516 03/27/21 1430 04/20/21 0723         Goals   Current Weight 207 lb (93.9 kg) 208 lb 12.4 oz (94.7 kg) 207 lb 0.2 oz (93.9 kg)     Nutrition Goal -- Pt to identify food quantities necessary to achieve weight loss of 6-24 lb at graduation from cardiac rehab. Pt to identify food quantities necessary to achieve weight loss of 6-24 lb at graduation from cardiac rehab.           Personal Goal #2 Re-Evaluation   Personal Goal #2 -- Pt to build a healthy plate including vegetables, fruits, whole grains, and low-fat dairy products in a heart healthy meal plan. Pt to build a healthy plate including vegetables, fruits, whole grains, and low-fat dairy products in a heart healthy meal plan.              Nutrition Goals Discharge (Final Nutrition Goals Re-Evaluation):  Nutrition Goals Re-Evaluation - 04/20/21 0723       Goals   Current Weight 207 lb 0.2 oz (93.9 kg)    Nutrition Goal Pt to identify food quantities necessary to achieve weight loss of 6-24 lb at graduation from cardiac rehab.      Personal Goal #2 Re-Evaluation   Personal Goal #2 Pt to build a healthy plate including vegetables, fruits, whole grains, and low-fat dairy products in a heart healthy meal plan.             Psychosocial: Target Goals: Acknowledge presence or absence of significant depression and/or stress, maximize coping skills, provide positive support system. Participant is able to verbalize types and ability to use techniques and skills needed for reducing stress and depression.  Initial Review & Psychosocial Screening:  Initial Psych  Review & Screening - 03/03/21 1558       Screening Interventions   Expected Outcomes Long Term Goal: Stressors or current issues are controlled or eliminated.;Long Term goal: The participant improves quality of Life and PHQ9 Scores as seen by post scores and/or verbalization of changes             Quality of Life Scores:  Scores of 19 and below usually indicate a poorer quality of life in these areas.  A difference of  2-3 points is a clinically meaningful difference.  A difference of 2-3 points in the total score of the Quality of Life Index has been associated with significant improvement in overall quality of life, self-image, physical symptoms, and general health in studies assessing change in quality of life.  PHQ-9: Recent Review Flowsheet Data     Depression screen [facility] 2/9 03/03/2021 06/27/2020 05/25/2020 04/20/2020 03/18/2020   Decreased Interest 0 0 1 0 1   Down, Depressed, Hopeless '1 1 1 1 2   '$ PHQ - 2 Score '1 1 2 1 3   '$ Altered sleeping 1 0 '1 1 2   '$ Tired, decreased energy '1 1 1 1 3   '$ Change in appetite 0 '1 2 1 1   '$ Feeling bad or failure about yourself  1 0 1 0 1   Trouble concentrating 0 0 '1 1 2   '$ Moving slowly or fidgety/restless 0 0 0 0 1   Suicidal thoughts 0 0 0 0 0   PHQ-9 Score - '3 8 5 13   '$ Difficult doing work/chores Somewhat difficult Not difficult at all Somewhat difficult Somewhat difficult Somewhat difficult      Interpretation of Total Score  Total Score Depression Severity:  1-4 = Minimal depression, 5-9 = Mild depression, 10-14 = Moderate depression, 15-19 = Moderately severe depression, 20-27 = Severe depression   Psychosocial Evaluation and Intervention:  Psychosocial Evaluation - 03/03/21 1558       Psychosocial Evaluation & Interventions   Interventions Encouraged to exercise with the program and follow exercise prescription    Comments Patient does show signs of depressions and states she has a history of depression. She has been on depression  medication in the past but is not currently on any medication. She attributes her depression to being stuck inside during the pandemic and her physical limitations since she was diagnosed with an asthma attack. She states she is not able to do things she wants and has been isolated at home during [place] because she is nervous to go out anywhere due to fear of getting COVID. She also lost her sister in 2019 and is still stuggling with that. We discussed counseling and she said she was considering it.    Expected Outcomes For pt to learn and develop healthy coping skills to manage depression symptoms and to improve quality of life by participating in exercise program.    Continue Psychosocial Services  Follow up required by staff             Psychosocial Re-Evaluation:  Psychosocial Re-Evaluation     [place] Name 03/27/21 1425 04/24/21 1439           Psychosocial Re-Evaluation   Current issues with Current Depression;History of Depression Current Depression;History of Depression      Comments Depression is stable at this time, she lost her sister in 2019. Suzanne Conner seems to have a positive attitude and depression is stable at this time.  Expected Outcomes For Suzanne Conner to remain positive and her depression stable. For Suzanne Conner to handle her depression in healthy ways and reach out to her physician if her depression worsens.      Interventions Encouraged to attend Pulmonary Rehabilitation for the exercise;Relaxation education;Stress management education Encouraged to attend Pulmonary Rehabilitation for the exercise;Relaxation education;Stress management education      Continue Psychosocial Services  Follow up required by staff No Follow up required               Psychosocial Discharge (Final Psychosocial Re-Evaluation):  Psychosocial Re-Evaluation - 04/24/21 1439       Psychosocial Re-Evaluation   Current issues with Current Depression;History of Depression    Comments Suzanne Conner seems to  have a positive attitude and depression is stable at this time.    Expected Outcomes For Suzanne Conner to handle her depression in healthy ways and reach out to her physician if her depression worsens.    Interventions Encouraged to attend Pulmonary Rehabilitation for the exercise;Relaxation education;Stress management education    Continue Psychosocial Services  No Follow up required             Education: Education Goals: Education classes will be provided on a weekly basis, covering required topics. Participant will state understanding/return demonstration of topics presented.  Learning Barriers/Preferences:  Learning Barriers/Preferences - 03/03/21 1420       Learning Barriers/Preferences   Learning Barriers Sight   Wears glasses   Learning Preferences Skilled Demonstration;Individual Instruction;Written Material             Education Topics: Risk Factor Reduction:  -Group instruction that is supported by a PowerPoint presentation. Instructor discusses the definition of a risk factor, different risk factors for pulmonary disease, and how the heart and lungs work together.     Nutrition for Pulmonary Patient:  -Group instruction provided by PowerPoint slides, verbal discussion, and written materials to support subject matter. The instructor gives an explanation and review of healthy diet recommendations, which includes a discussion on weight management, recommendations for fruit and vegetable consumption, as well as protein, fluid, caffeine, fiber, sodium, sugar, and alcohol. Tips for eating when patients are short of breath are discussed.   Pursed Lip Breathing:  -Group instruction that is supported by demonstration and informational handouts. Instructor discusses the benefits of pursed lip and diaphragmatic breathing and detailed demonstration on how to preform both.     Oxygen Safety:  -Group instruction provided by PowerPoint, verbal discussion, and written material to  support subject matter. There is an overview of "What is Oxygen" and "Why do we need it".  Instructor also reviews how to create a safe environment for oxygen use, the importance of using oxygen as prescribed, and the risks of noncompliance. There is a brief discussion on traveling with oxygen and resources the patient may utilize.   Oxygen Equipment:  -Group instruction provided by [facility] Staff utilizing handouts, written materials, and equipment demonstrations.   Signs and Symptoms:  -Group instruction provided by written material and verbal discussion to support subject matter. Warning signs and symptoms of infection, stroke, and heart attack are reviewed and when to call the physician/911 reinforced. Tips for preventing the spread of infection discussed.   Advanced Directives:  -Group instruction provided by verbal instruction and written material to support subject matter. Instructor reviews Advanced Directive laws and proper instruction for filling out document.   Pulmonary Video:  -Group video education that reviews the importance of medication and oxygen compliance, exercise, good nutrition,  pulmonary hygiene, and pursed lip and diaphragmatic breathing for the pulmonary patient.   Exercise for the Pulmonary Patient:  -Group instruction that is supported by a PowerPoint presentation. Instructor discusses benefits of exercise, core components of exercise, frequency, duration, and intensity of an exercise routine, importance of utilizing pulse oximetry during exercise, safety while exercising, and options of places to exercise outside of rehab.     Pulmonary Medications:  -Verbally interactive group education provided by instructor with focus on inhaled medications and proper administration. Flowsheet Row PULMONARY REHAB OTHER RESPIRATORY from 04/18/2021 in [place]  Date 04/13/21  Educator handout       Anatomy and Physiology of the  Respiratory System and Intimacy:  -Group instruction provided by PowerPoint, verbal discussion, and written material to support subject matter. Instructor reviews respiratory cycle and anatomical components of the respiratory system and their functions. Instructor also reviews differences in obstructive and restrictive respiratory diseases with examples of each. Intimacy, Sex, and Sexuality differences are reviewed with a discussion on how relationships can change when diagnosed with pulmonary disease. Common sexual concerns are reviewed.   MD DAY -A group question and answer session with a medical doctor that allows participants to ask questions that relate to their pulmonary disease state.   OTHER EDUCATION -Group or individual verbal, written, or video instructions that support the educational goals of the pulmonary rehab program. [place] from 04/18/2021 in [place]  Date 04/18/21  [METS]  Educator Jess-H/O  [METS]  Instruction Review Code 1- Verbalizes Understanding       Holiday Eating Survival Tips:  -Group instruction provided by PowerPoint slides, verbal discussion, and written materials to support subject matter. The instructor gives patients tips, tricks, and techniques to help them not only survive but enjoy the holidays despite the onslaught of food that accompanies the holidays.   Knowledge Questionnaire Score:  Knowledge Questionnaire Score - 03/03/21 1537       Knowledge Questionnaire Score   Pre Score 14/18             Core Components/Risk Factors/Patient Goals at Admission:  Personal Goals and Risk Factors at Admission - 03/03/21 1603       Core Components/Risk Factors/Patient Goals on Admission    Weight Management Weight Loss    Improve shortness of breath with ADL's Yes    Intervention Provide education, individualized exercise plan and daily activity instruction to help decrease  symptoms of SOB with activities of daily living.    Expected Outcomes Short Term: Improve cardiorespiratory fitness to achieve a reduction of symptoms when performing ADLs;Long Term: Be able to perform more ADLs without symptoms or delay the onset of symptoms    Hypertension Yes    Intervention Provide education on lifestyle modifcations including regular physical activity/exercise, weight management, moderate sodium restriction and increased consumption of fresh fruit, vegetables, and low fat dairy, alcohol moderation, and smoking cessation.;Monitor prescription use compliance.    Expected Outcomes Short Term: Continued assessment and intervention until BP is < 140/28m HG in hypertensive participants. < 130/815mHG in hypertensive participants with diabetes, heart failure or chronic kidney disease.;Long Term: Maintenance of blood pressure at goal levels.             Core Components/Risk Factors/Patient Goals Review:   Goals and Risk Factor Review     Row Name 03/27/21 1427 04/24/21 1440           Core Components/Risk Factors/Patient  Goals Review   Personal Goals Review Develop more efficient breathing techniques such as purse lipped breathing and diaphragmatic breathing and practicing self-pacing with activity.;Increase knowledge of respiratory medications and ability to use respiratory devices properly.;Improve shortness of breath with ADL's Develop more efficient breathing techniques such as purse lipped breathing and diaphragmatic breathing and practicing self-pacing with activity.;Increase knowledge of respiratory medications and ability to use respiratory devices properly.;Improve shortness of breath with ADL's      Review Suzanne Conner has attended 5 exercise sessions, too early to see progression towards program goals. Suzanne Conner has profressed to walking 13-16 laps on the track in 15 mins and exercising at level 4 on the nustep.  She has made great progress and has increased her strength and  stamina.      Expected Outcomes See admission goals. See admission goals.               Core Components/Risk Factors/Patient Goals at Discharge (Final Review):   Goals and Risk Factor Review - 04/24/21 1440       Core Components/Risk Factors/Patient Goals Review   Personal Goals Review Develop more efficient breathing techniques such as purse lipped breathing and diaphragmatic breathing and practicing self-pacing with activity.;Increase knowledge of respiratory medications and ability to use respiratory devices properly.;Improve shortness of breath with ADL's    Review Suzanne Conner has profressed to walking 13-16 laps on the track in 15 mins and exercising at level 4 on the nustep.  She has made great progress and has increased her strength and stamina.    Expected Outcomes See admission goals.             ITP Comments:   Comments: ITP REVIEW Pt is making expected progress toward pulmonary rehab goals after completing 10 sessions. Recommend continued exercise, life style modification, education, and utilization of breathing techniques to increase stamina and strength and decrease shortness of breath with exertion.

## 2021-04-27 ENCOUNTER — Other Ambulatory Visit: Payer: Self-pay

## 2021-04-27 ENCOUNTER — Encounter (HOSPITAL_COMMUNITY): Admission: RE | Admit: 2021-04-27 | Payer: Medicare Other | Source: Ambulatory Visit

## 2021-05-02 ENCOUNTER — Other Ambulatory Visit: Payer: Self-pay

## 2021-05-02 ENCOUNTER — Encounter (HOSPITAL_COMMUNITY)
Admission: RE | Admit: 2021-05-02 | Discharge: 2021-05-02 | Disposition: A | Discharge status: Home / Self Care | Payer: Medicare Other | Source: Ambulatory Visit | Attending: Pulmonary Disease | Admitting: Pulmonary Disease

## 2021-05-02 VITALS — Wt 208.3 lb

## 2021-05-02 DIAGNOSIS — J4520 Mild intermittent asthma, uncomplicated: Secondary | ICD-10-CM | POA: Diagnosis not present

## 2021-05-02 NOTE — Progress Notes (Signed)
Daily Session Note  Patient Details  Name: Suzanne Conner MRN: [id number] Date of Birth: 29-Oct-1945 Referring Provider:   April Manson Pulmonary Rehab Walk Test from 03/03/2021 in [place]  Referring Provider Dr. Loanne Drilling       Encounter Date: 05/02/2021  Check In:  Session Check In - 05/02/21 1457       Check-In   Supervising physician immediately available to respond to emergencies [facility] immediately available    Physician(s) Dr. Ernestina Columbia    Location [facility]    Staff Present Rodney Langton, RN;Jessica Hassell Done, MS, ACSM-CEP, Exercise Physiologist;Kaylee Rosana Hoes, MS, ACSM-CEP, Exercise Physiologist    Virtual Visit No    Medication changes reported     No    Fall or balance concerns reported    No    Tobacco Cessation No Change    Warm-up and Cool-down Performed as group-led instruction    Resistance Training Performed Yes    VAD Patient? No    PAD/SET Patient? No      Pain Assessment   Currently in Pain? No/denies    Multiple Pain Sites No             Capillary Blood Glucose: No results found for this or any previous visit (from the past 24 hour(s)).   Exercise Prescription Changes - 05/02/21 1500       Response to Exercise   Blood Pressure (Admit) 122/60    Blood Pressure (Exercise) 122/64    Blood Pressure (Exit) 118/60    Heart Rate (Admit) 75 bpm    Heart Rate (Exercise) 110 bpm    Heart Rate (Exit) 82 bpm    Oxygen Saturation (Admit) 99 %    Oxygen Saturation (Exercise) 90 %    Oxygen Saturation (Exit) 98 %    Rating of Perceived Exertion (Exercise) 11    Perceived Dyspnea (Exercise) 2    Duration Continue with 30 min of aerobic exercise without signs/symptoms of physical distress.    Intensity THRR unchanged      Progression   Progression Continue to progress workloads to maintain intensity without signs/symptoms of physical distress.      Resistance Training   Training Prescription Yes     Weight red bands    Reps 10-15    Time 10 Minutes      NuStep   Level 4    SPM 80    Minutes 15    METs 2.1      Track   Laps 14    Minutes 15             Social History   Tobacco Use  Smoking Status Never  Smokeless Tobacco Never    Goals Met:  Exercise tolerated well No report of cardiac concerns or symptoms Strength training completed today  Goals Unmet:  Not Applicable  Comments: Service time is from 1:15 to 14:45    Dr. Fransico Him is Medical Director for Cardiac Rehab at [facility].

## 2021-05-03 ENCOUNTER — Telehealth (INDEPENDENT_AMBULATORY_CARE_PROVIDER_SITE_OTHER): Payer: Medicare Other | Admitting: Family Medicine

## 2021-05-03 ENCOUNTER — Encounter: Payer: Self-pay | Admitting: Family Medicine

## 2021-05-03 DIAGNOSIS — R635 Abnormal weight gain: Secondary | ICD-10-CM | POA: Diagnosis not present

## 2021-05-03 DIAGNOSIS — I10 Essential (primary) hypertension: Secondary | ICD-10-CM

## 2021-05-03 DIAGNOSIS — J4520 Mild intermittent asthma, uncomplicated: Secondary | ICD-10-CM | POA: Diagnosis not present

## 2021-05-03 NOTE — Progress Notes (Signed)
Nutrition Note Spoke with pt.  She has maintained her weight during rehab. She reports intense fear of gaining weight. Encouraged pt to seek out mental health therapist for this.  She reports hunger all the time. She has been craving carbs. Per diet recall, she is eating 1000-1100 calories and 75 g carbs each day. Estimated energy needs are 1500 kcals for weight loss. Provided pt with increased calorie recommendations. Reviewed importance of eating adequate calories for weight loss.  Pt verbalizes understanding.   Nutrition Diagnosis  Food-and nutrition-related knowledge deficit related to lack of exposure to information as related to diagnosis of: ? HTN? pt goal of sustainable weight loss   Nutrition Intervention  Pt's individual nutrition plan reviewed with pt. Benefits of adopting healthy diet reviewed with Rate My Plate survey               Continue client-centered nutrition education by RD, as part of interdisciplinary care.   Goal(s)   Pt to identify food quantities necessary to achieve weight loss of 6-24 lb at graduation from cardiac rehab.  Pt to build a healthy plate including vegetables, fruits, whole grains, and low-fat dairy products in a heart healthy meal plan.   Plan:  Will provide client-centered nutrition education as part of interdisciplinary care Monitor and evaluate progress toward nutrition goal with team.   Michaele Offer, MS, RDN, LDN, CDCES

## 2021-05-03 NOTE — Progress Notes (Signed)
Virtual Visit via Video Note  I connected with Dezirea Stranger on 05/03/21 at  4:30 PM EDT by a video enabled telemedicine application 2/2 [id number] pandemic and verified that I am speaking with the correct person using two identifiers.  Location patient: home Location provider:work or home office Persons participating in the virtual visit: patient, provider  I discussed the limitations of evaluation and management by telemedicine and the availability of in person appointments. The patient expressed understanding and agreed to proceed.   HPI: Pt seen by Pulm for asthma.  In pulm rehab.  Notes some improvement in breathing.  Using inhalers.  Pt notes bp at times >140 sys in the am.  Taking melatonin and ashwaghanda to help with sleep.  Wakes up feeling rested most nights.  Pt notes insatiable appetite, loves carbs, states food is comfort.  Pt notes feeling in a better place than during previous visits.   ROS: See pertinent positives and negatives per HPI.  Past Medical History:  Diagnosis Date   Arthritis    "left hip; right wrist" (06/12/2018)   Asthma    Depression    Family history of polyps in the colon    GERD (gastroesophageal reflux disease)    Glaucoma    Hypertension    Type 2 diabetes, diet controlled ([place])     Past Surgical History:  Procedure Laterality Date   BUNIONECTOMY Right    CATARACT EXTRACTION W/ INTRAOCULAR LENS IMPLANT Right    JOINT REPLACEMENT     REPLACEMENT TOTAL KNEE Right 2015   REVERSE SHOULDER ARTHROPLASTY Left 06/12/2018   REVERSE SHOULDER ARTHROPLASTY Left 06/12/2018   Procedure: LEFT REVERSE SHOULDER ARTHROPLASTY;  Surgeon: Justice Britain, MD;  Location: [place];  Service: Orthopedics;  Laterality: Left;   SHOULDER ARTHROSCOPY W/ ROTATOR CUFF REPAIR Right 2015   TUBAL LIGATION      Family History  Problem Relation Age of Onset   Asthma Brother    Diabetes Brother    Dementia Mother    Cancer Father    Heart disease Sister    Diabetes Brother     Diabetes Brother    Healthy Son    Healthy Daughter     Current Outpatient Medications:    albuterol (VENTOLIN HFA) 108 (90 Base) MCG/ACT inhaler, Inhale 2 puffs into the lungs every 6 (six) hours as needed for wheezing or shortness of breath., Disp: 3 each, Rfl: 2   aspirin EC 81 MG tablet, Take 81 mg by mouth daily., Disp: , Rfl:    Cholecalciferol (VITAMIN D) 2000 units CAPS, Take 2,000 Units by mouth daily., Disp: , Rfl:    clindamycin (CLEOCIN) 300 MG capsule, Take 300 mg by mouth 2 (two) times daily. Prior to dental appointments, Disp: , Rfl:    Clobetasol Propionate 0.05 % shampoo, Use a small amount on the affected areas twice a day as needed., Disp: 118 mL, Rfl: 0   fluticasone-salmeterol (ADVAIR HFA) 230-21 MCG/ACT inhaler, Inhale 2 puffs into the lungs 2 (two) times daily., Disp: 3 each, Rfl: 3   hydrochlorothiazide (HYDRODIURIL) 12.5 MG tablet, TAKE 1 TABLET DAILY, Disp: 90 tablet, Rfl: 3   hydrocortisone valerate cream (WESTCORT) 0.2 %, Apply 1 application topically 2 (two) times daily., Disp: 45 g, Rfl: 0   latanoprost (XALATAN) 0.005 % ophthalmic solution, Apply to eye., Disp: , Rfl:    Melatonin 10 MG TABS, Take 1 tablet by mouth at bedtime as needed., Disp: , Rfl:    metoprolol tartrate (LOPRESSOR) 25 MG tablet, TAKE  1 TABLET TWICE A DAY, Disp: 180 tablet, Rfl: 3   Multiple Vitamins-Minerals (CENTRUM SILVER PO), Take 1 tablet by mouth daily. , Disp: , Rfl:    omeprazole (PRILOSEC) 40 MG capsule, TAKE 1 CAPSULE TWICE A DAY, Disp: 180 capsule, Rfl: 3   Probiotic Product (TRUNATURE DIGESTIVE PROBIOTIC) CAPS, Take 1 capsule by mouth daily. , Disp: , Rfl:   EXAM:  VITALS per patient if applicable: RR between 123456 bpm  GENERAL: alert, oriented, appears well and in no acute distress  HEENT: atraumatic, conjunctiva clear, no obvious abnormalities on inspection of external nose and ears  NECK: normal movements of the head and neck  LUNGS: on inspection no signs of respiratory  distress, breathing rate appears normal, no obvious gross SOB, gasping or wheezing  CV: no obvious cyanosis  MS: moves all visible extremities without noticeable abnormality  PSYCH/NEURO: pleasant and cooperative, no obvious depression or anxiety, speech and thought processing grossly intact  ASSESSMENT AND PLAN:  Discussed the following assessment and plan:  Mild intermittent asthma without complication -Controlled/improving -Continue albuterol as needed and Advair 230-21 2 puffs twice daily -Continue pulmonary rehab  Essential hypertension -Continue current medications -Continue lifestyle modifications -Patient encouraged to bring log of BP readings with her to next OFV  Weight gain -Continue lifestyle modifications -We will obtain labs in next OFV  Pt to schedule AWV/follow-up in the next few weeks.   I discussed the assessment and treatment plan with the patient. The patient was provided an opportunity to ask questions and all were answered. The patient agreed with the plan and demonstrated an understanding of the instructions.   The patient was advised to call back or seek an in-person evaluation if the symptoms worsen or if the condition fails to improve as anticipated.  Billie Ruddy, MD

## 2021-05-04 ENCOUNTER — Other Ambulatory Visit: Payer: Self-pay

## 2021-05-04 ENCOUNTER — Encounter (HOSPITAL_COMMUNITY)
Admission: RE | Admit: 2021-05-04 | Discharge: 2021-05-04 | Disposition: A | Discharge status: Home / Self Care | Payer: Medicare Other | Source: Ambulatory Visit | Attending: Pulmonary Disease | Admitting: Pulmonary Disease

## 2021-05-04 DIAGNOSIS — J4520 Mild intermittent asthma, uncomplicated: Secondary | ICD-10-CM | POA: Diagnosis not present

## 2021-05-04 NOTE — Progress Notes (Signed)
Daily Session Note  Patient Details  Name: Suzanne Conner MRN: [id number] Date of Birth: 1946/06/24 Referring Provider:   April Manson Pulmonary Rehab Walk Test from 03/03/2021 in [place]  Referring Provider Dr. Loanne Drilling       Encounter Date: 05/04/2021  Check In:  Session Check In - 05/04/21 1505       Check-In   Supervising physician immediately available to respond to emergencies [facility] immediately available    Physician(s) Dr. Sloan Leiter    Location [facility]    Staff Present Rodney Langton, RN;Jessica Hassell Done, MS, ACSM-CEP, Exercise Physiologist;Zyairah Wacha Rosana Hoes, MS, ACSM-CEP, Exercise Physiologist    Virtual Visit No    Medication changes reported     No    Fall or balance concerns reported    No    Tobacco Cessation No Change    Warm-up and Cool-down Performed as group-led instruction    Resistance Training Performed Yes    VAD Patient? No      Pain Assessment   Currently in Pain? No/denies    Pain Score 0-No pain    Multiple Pain Sites No             Capillary Blood Glucose: No results found for this or any previous visit (from the past 24 hour(s)).    Social History   Tobacco Use  Smoking Status Never  Smokeless Tobacco Never    Goals Met:  Proper associated with RPD/PD & O2 Sat Exercise tolerated well No report of concerns or symptoms today Strength training completed today  Goals Unmet:  Not Applicable  Comments: Service time is from 1322 to 1425. Pt left early to pick up her grandson.     Dr. Fransico Him is Medical Director for Cardiac Rehab at [facility]

## 2021-05-09 ENCOUNTER — Other Ambulatory Visit: Payer: Self-pay

## 2021-05-09 ENCOUNTER — Encounter (HOSPITAL_COMMUNITY)
Admission: RE | Admit: 2021-05-09 | Discharge: 2021-05-09 | Disposition: A | Discharge status: Home / Self Care | Payer: Medicare Other | Source: Ambulatory Visit | Attending: Pulmonary Disease | Admitting: Pulmonary Disease

## 2021-05-09 VITALS — Wt 207.2 lb

## 2021-05-09 DIAGNOSIS — J4520 Mild intermittent asthma, uncomplicated: Secondary | ICD-10-CM | POA: Diagnosis not present

## 2021-05-09 NOTE — Progress Notes (Addendum)
Daily Session Note  Patient Details  Name: Suzanne Conner MRN: [id number] Date of Birth: 10/25/1945 Referring Provider:   April Manson Pulmonary Rehab Walk Test from 03/03/2021 in [place]  Referring Provider Dr. Loanne Drilling       Encounter Date: 05/09/2021  Check In:  Session Check In - 05/09/21 1509       Check-In   Supervising physician immediately available to respond to emergencies [facility] immediately available    Physician(s) Dr. Doristine Bosworth    Location [facility]    Staff Present [name], RN, Deland Pretty, MS, ACSM CEP, Exercise Physiologist;Naseem Varden Rosana Hoes, MS, ACSM-CEP, Exercise Physiologist;Lisa Ysidro Evert, RN    Virtual Visit No    Medication changes reported     No    Fall or balance concerns reported    No    Tobacco Cessation No Change    Warm-up and Cool-down Performed as group-led instruction    Resistance Training Performed Yes    VAD Patient? No    PAD/SET Patient? No      Pain Assessment   Currently in Pain? No/denies    Multiple Pain Sites No             Capillary Blood Glucose: No results found for this or any previous visit (from the past 24 hour(s)).    Social History   Tobacco Use  Smoking Status Never  Smokeless Tobacco Never    Goals Met:  Proper associated with RPD/PD & O2 Sat Exercise tolerated well No report of concerns or symptoms today  Goals Unmet:  Not Applicable  Comments: Service time is from 1325 to 1415  Pt last exercise session was today and 6 MWT was completed.   Dr. Fransico Him is Medical Director for Cardiac Rehab at [facility].

## 2021-05-10 ENCOUNTER — Other Ambulatory Visit: Payer: Self-pay

## 2021-05-11 ENCOUNTER — Ambulatory Visit (INDEPENDENT_AMBULATORY_CARE_PROVIDER_SITE_OTHER): Payer: Medicare Other | Admitting: Family Medicine

## 2021-05-11 ENCOUNTER — Encounter: Payer: Self-pay | Admitting: Family Medicine

## 2021-05-11 VITALS — BP 130/80 | HR 80 | Temp 97.9°F | Ht 63.0 in | Wt 206.0 lb

## 2021-05-11 DIAGNOSIS — I10 Essential (primary) hypertension: Secondary | ICD-10-CM

## 2021-05-11 DIAGNOSIS — D860 Sarcoidosis of lung: Secondary | ICD-10-CM | POA: Diagnosis not present

## 2021-05-11 DIAGNOSIS — R0602 Shortness of breath: Secondary | ICD-10-CM

## 2021-05-11 DIAGNOSIS — E118 Type 2 diabetes mellitus with unspecified complications: Secondary | ICD-10-CM

## 2021-05-11 DIAGNOSIS — R200 Anesthesia of skin: Secondary | ICD-10-CM | POA: Diagnosis not present

## 2021-05-11 DIAGNOSIS — Z0000 Encounter for general adult medical examination without abnormal findings: Secondary | ICD-10-CM

## 2021-05-11 DIAGNOSIS — R209 Unspecified disturbances of skin sensation: Secondary | ICD-10-CM | POA: Diagnosis not present

## 2021-05-11 LAB — T4, FREE: Free T4: 0.99 ng/dL (ref 0.60–1.60)

## 2021-05-11 LAB — CBC WITH DIFFERENTIAL/PLATELET
Basophils Absolute: 0 10*3/uL (ref 0.0–0.1)
Basophils Relative: 0.6 % (ref 0.0–3.0)
Eosinophils Absolute: 0.1 10*3/uL (ref 0.0–0.7)
Eosinophils Relative: 1.7 % (ref 0.0–5.0)
HCT: 40.1 % (ref 36.0–46.0)
Hemoglobin: 13.3 g/dL (ref 12.0–15.0)
Lymphocytes Relative: 36.9 % (ref 12.0–46.0)
Lymphs Abs: 2.9 10*3/uL (ref 0.7–4.0)
MCHC: 33.2 g/dL (ref 30.0–36.0)
MCV: 85.1 fl (ref 78.0–100.0)
Monocytes Absolute: 0.7 10*3/uL (ref 0.1–1.0)
Monocytes Relative: 8.5 % (ref 3.0–12.0)
Neutro Abs: 4.1 10*3/uL (ref 1.4–7.7)
Neutrophils Relative %: 52.3 % (ref 43.0–77.0)
Platelets: 292 10*3/uL (ref 150.0–400.0)
RBC: 4.71 Mil/uL (ref 3.87–5.11)
RDW: 14.3 % (ref 11.5–15.5)
WBC: 7.9 10*3/uL (ref 4.0–10.5)

## 2021-05-11 LAB — COMPREHENSIVE METABOLIC PANEL
ALT: 18 U/L (ref 0–35)
AST: 19 U/L (ref 0–37)
Albumin: 4.1 g/dL (ref 3.5–5.2)
Alkaline Phosphatase: 65 U/L (ref 39–117)
BUN: 15 mg/dL (ref 6–23)
CO2: 31 mEq/L (ref 19–32)
Calcium: 9.8 mg/dL (ref 8.4–10.5)
Chloride: 102 mEq/L (ref 96–112)
Creatinine, Ser: 0.65 mg/dL (ref 0.40–1.20)
GFR: 87.12 mL/min (ref >60.00)
Glucose, Bld: 103 mg/dL — ABNORMAL HIGH (ref 70–99)
Potassium: 4.6 mEq/L (ref 3.5–5.1)
Sodium: 142 mEq/L (ref 135–145)
Total Bilirubin: 0.6 mg/dL (ref 0.2–1.2)
Total Protein: 7.2 g/dL (ref 6.0–8.3)

## 2021-05-11 LAB — LIPID PANEL
Cholesterol: 127 mg/dL (ref 0–200)
HDL: 53.5 mg/dL (ref >39.00)
LDL Cholesterol: 61 mg/dL (ref 0–99)
NonHDL: 73.34
Total CHOL/HDL Ratio: 2
Triglycerides: 62 mg/dL (ref 0.0–149.0)
VLDL: 12.4 mg/dL (ref 0.0–40.0)

## 2021-05-11 LAB — VITAMIN B12: Vitamin B-12: >1550 pg/mL — ABNORMAL HIGH (ref 211–911)

## 2021-05-11 LAB — FOLATE: Folate: >24.4 ng/mL (ref >5.9)

## 2021-05-11 LAB — TSH: TSH: 1 u[IU]/mL (ref 0.35–5.50)

## 2021-05-11 LAB — HEMOGLOBIN A1C: Hgb A1c MFr Bld: 5.9 % (ref 4.6–6.5)

## 2021-05-11 NOTE — Progress Notes (Signed)
Annual Wellness Visit  Patient: Suzanne Conner, Female    DOB: 09-Feb-1946, 75 y.o.   MRN: [id number] Visit Date: 05/11/2021  Today's Provider: Billie Ruddy, MD  Subjective:    Chief Complaint  Patient presents with   Annual Exam   Suzanne Conner is a 75 y.o. female who presents today for her Annual Wellness Visit.  HPI Patient states she is doing okay.  Still endorses SOB with going up stairs and exertion.  Completed pulmonary rehab.  Notes sarcoidosis of lung stable and thought less likely to be contributing to symptoms.  Patient inquires if metoprolol is causing numbness and cold sensation in feet.  Patient exercising by walking on a treadmill and doing water aerobics 2-3 times per week.    Medications: Outpatient Medications Prior to Visit  Medication Sig   albuterol (VENTOLIN HFA) 108 (90 Base) MCG/ACT inhaler Inhale 2 puffs into the lungs every 6 (six) hours as needed for wheezing or shortness of breath.   aspirin EC 81 MG tablet Take 81 mg by mouth daily.   Cholecalciferol (VITAMIN D) 2000 units CAPS Take 2,000 Units by mouth daily.   clindamycin (CLEOCIN) 300 MG capsule Take 300 mg by mouth 2 (two) times daily. Prior to dental appointments   Clobetasol Propionate 0.05 % shampoo Use a small amount on the affected areas twice a day as needed.   fluticasone-salmeterol (ADVAIR HFA) 230-21 MCG/ACT inhaler Inhale 2 puffs into the lungs 2 (two) times daily.   hydrochlorothiazide (HYDRODIURIL) 12.5 MG tablet TAKE 1 TABLET DAILY   hydrocortisone valerate cream (WESTCORT) 0.2 % Apply 1 application topically 2 (two) times daily.   latanoprost (XALATAN) 0.005 % ophthalmic solution Apply to eye.   Melatonin 10 MG TABS Take 1 tablet by mouth at bedtime as needed.   metoprolol tartrate (LOPRESSOR) 25 MG tablet TAKE 1 TABLET TWICE A DAY   Multiple Vitamins-Minerals (CENTRUM SILVER PO) Take 1 tablet by mouth daily.    omeprazole (PRILOSEC) 40 MG capsule TAKE 1 CAPSULE TWICE A DAY    Probiotic Product (TRUNATURE DIGESTIVE PROBIOTIC) CAPS Take 1 capsule by mouth daily.    No facility-administered medications prior to visit.    Allergies  Allergen Reactions   Penicillins Hives and Other (See Comments)    PATIENT HAS HAD A PCN REACTION WITH IMMEDIATE RASH, FACIAL/TONGUE/THROAT SWELLING, SOB, OR LIGHTHEADEDNESS WITH HYPOTENSION:  #  #  YES  #  #  INCLUDING HIVES AS A CHILD Has patient had a PCN reaction causing severe rash involving mucus membranes or skin necrosis: No Has patient had a PCN reaction that required hospitalization: No Has patient had a PCN reaction occurring within the last 10 years: No If all of the above answers are "NO", then may proceed with Cephalosporin use. PATIENT HAS HAD A PCN REACTION WITH IMMEDIATE RASH, FACIAL/TONGUE/THROAT SWELLING, SOB, OR LIGHTHEADEDNESS WITH HYPOTENSION:  #  #  YES  #  #  INCLUDING HIVES AS A CHILD Has patient had a PCN reaction causing severe rash involving mucus membranes or skin necrosis: No Has patient had a PCN reaction that required hospitalization: No Has patient had a PCN reaction occurring within the last 10 years: No If all of the above answers are "NO", then may proceed with Cephalosporin use.    Paxil [Paroxetine]     Paxil 20 mg caused a tremor.    Patient Care Team: Billie Ruddy, MD as PCP - General (Family Medicine) Paralee Cancel, MD as Consulting Physician (Orthopedic  Surgery) Boisvert, Marvia Pickles, MD as Referring Physician (Ophthalmology) Hortencia Pilar, MD as Consulting Physician (Surgery)  Review of Systems General: Denies fever, chills, night sweats, changes in weight, changes in appetite HEENT: Denies headaches, ear pain, changes in vision, rhinorrhea, sore throat CV: Denies CP, palpitations, orthopnea + SOB, cold feet Pulm: Denies cough, wheezing + SOB GI: Denies abdominal pain, nausea, vomiting, diarrhea, constipation GU: Denies dysuria, hematuria, frequency, vaginal discharge Msk:  Denies muscle cramps, joint pains Neuro: Denies weakness,tingling + numbness in bilateral feet Skin: Denies rashes, bruising   Psych: Denies depression, anxiety, hallucinations  Objective:    Vitals: BP 130/80 (BP Location: Right Arm, Patient Position: Sitting, Cuff Size: Normal)   Pulse 80   Temp 97.9 F (36.6 C) (Oral)   Ht '5\' 3"'$  (1.6 m)   Wt 206 lb (93.4 kg)   SpO2 98%   BMI 36.49 kg/m   Physical Exam Gen. Pleasant, well developed, anxious, well-nourished, in NAD HEENT - [place]/AT, PERRL, EOMI, conjunctive clear, no scleral icterus, no nasal drainage, pharynx without erythema or exudate.  TMs normal bilaterally. Neck: No JVD, no thyromegaly, no carotid bruits Lungs: no use of accessory muscles, CTAB, no wheezes, rales or rhonchi Cardiovascular: RRR, No r/g/m, no peripheral edema Abdomen: BS present, soft, nontender,nondistended, no hepatosplenomegaly Musculoskeletal: No deformities, moves all four extremities, no cyanosis or clubbing, normal tone Neuro:  A&Ox3, CN II-XII intact, normal gait Skin:  Warm, dry, intact, no lesions   Most recent functional status assessment: Able to complete ADLs  Most recent fall risk assessment: Fall Risk  05/11/2021  Falls in the past year? 0     Most recent depression screenings: PHQ 2/9 Scores 05/09/2021 03/03/2021  PHQ - 2 Score 1 1  PHQ- 9 Score 5 -      Assessment & Plan:      Annual wellness visit done today including the all of the following: Reviewed patient's Family Medical History Reviewed and updated list of patient's medical providers Assessment of cognitive impairment was done Assessed patient's functional ability Established a written schedule for health screening services Health Risk Assessent Completed and Reviewed   Immunization History  Administered Date(s) Administered   Fluad Quad(high Dose 65+) 06/22/2020   Influenza, High Dose Seasonal PF 05/12/2019   Influenza-Unspecified 05/29/2018, 05/12/2019    PFIZER(Purple Top)SARS-COV-2 Vaccination 10/01/2019, 10/22/2019, 06/17/2020   Pneumococcal Polysaccharide-23 06/13/2018   Zoster Recombinat (Shingrix) 03/10/2019, 05/12/2019   Zoster, Live 04/27/2008    Health Maintenance  Topic Date Due   FOOT EXAM  Never done   URINE MICROALBUMIN  Never done   Hepatitis C Screening  Never done   TETANUS/TDAP  Never done   MAMMOGRAM  Never done   DEXA SCAN  Never done   PNA vac Low Risk Adult (2 of 2 - PCV13) 06/14/2019   COVID-19 Vaccine (4 - Booster for Pfizer series) 09/17/2020   HEMOGLOBIN A1C  09/18/2020   INFLUENZA VACCINE  04/10/2021   OPHTHALMOLOGY EXAM  02/13/2022   COLONOSCOPY (Pts 45-49yr Insurance coverage will need to be confirmed)  11/08/2027   Zoster Vaccines- Shingrix  Completed   HPV VACCINES  Aged Out    Discussed health benefits of physical activity, and encouraged her to engage in regular exercise appropriate for her age and condition.  Patient has advanced directives in place.   Medicare annual wellness visit, subsequent  Bilateral cold feet -Possible causes including peripheral vascular disease, anemia -Discussed supportive care including elevating LEs, compression socks/TED hose  Numbness in feet -Concern  medication may be causing symptoms.  Discussed vitamin deficiencies/electrolyte abnormalities, DM can also contribute -We will obtain labs -open to d/c'ing metoprolol and starting a new medication for blood pressure - Plan: TSH, T4, Free, CMP, Vitamin B12, Folate  SOB (shortness of breath)  -Continue follow-up with pulmonology, Dr. Loanne Drilling -Encouraged to keep upcoming ENT appointment as vocal cord paralysis may be contributing to symptoms -EKG this visit sinus rhythm, T wave inversion in lead III as seen on prior EKG from 10/05/2020 - Plan: EKG 12-Lead, CBC with Differential/Platelet, TSH, T4, Free  Essential hypertension -Elevated -Continue lifestyle modifications -Continue metoprolol tartrate 25 mg twice daily  and HCTZ 12.5 mg daily -Concern metoprolol may be contributing to numbness/cold feet.  Discussed obtaining labs and if labs normal open to switching blood pressure medication. - Plan: Lipid panel, CMP  Pulmonary sarcoidosis (HCC) -Stable -Continue follow-up with pulmonology  Controlled type 2 diabetes mellitus with complication, without long-term current use of insulin ([place])  -Hemoglobin A1c 5.6% on 03/18/2020 -Diet controlled -Foot exam due -Eye exam done 02/13/2021 -Offered flu shot. - Plan: Hemoglobin A1c  Billie Ruddy, MD

## 2021-05-12 ENCOUNTER — Encounter: Payer: Self-pay | Admitting: Family Medicine

## 2021-05-16 NOTE — Progress Notes (Signed)
Discharge Progress Report  Patient Details  Name: Suzanne Conner MRN: [id number] Date of Birth: 1945-09-30 Referring Provider:   April [facility] Walk Test from 03/03/2021 in [place]  Referring Provider Dr. Loanne Drilling        Number of Visits: 14  Reason for Discharge:  Patient reached a stable level of exercise. Patient independent in their exercise. Patient has met program and personal goals.  Smoking History:  Social History   Tobacco Use  Smoking Status Never  Smokeless Tobacco Never    Diagnosis:  Mild intermittent asthma without complication  ADL UCSD:  Pulmonary Assessment Scores     Row Name 03/03/21 1536 05/09/21 1514       ADL UCSD   ADL Phase Entry Exit    SOB Score total 17 27         CAT Score   CAT Score 11 20         mMRC Score   mMRC Score 2 2             Initial Exercise Prescription:  Initial Exercise Prescription - 03/03/21 1500       Date of Initial Exercise RX and Referring Provider   Date 03/03/21    Referring Provider Dr. Loanne Drilling    Expected Discharge Date 05/04/21      NuStep   Level 1    SPM 70    Minutes 15      Track   Minutes 15      Prescription Details   Frequency (times per week) 2    Duration Progress to 30 minutes of continuous aerobic without signs/symptoms of physical distress      Intensity   THRR 40-80% of Max Heartrate 59-118    Ratings of Perceived Exertion 11-13    Perceived Dyspnea 0-4      Progression   Progression Continue to progress workloads to maintain intensity without signs/symptoms of physical distress.      Resistance Training   Training Prescription Yes    Weight Red bands    Reps 10-15             Discharge Exercise Prescription (Final Exercise Prescription Changes):  Exercise Prescription Changes - 05/02/21 1500       Response to Exercise   Blood Pressure (Admit) 122/60    Blood Pressure (Exercise) 122/64    Blood  Pressure (Exit) 118/60    Heart Rate (Admit) 75 bpm    Heart Rate (Exercise) 110 bpm    Heart Rate (Exit) 82 bpm    Oxygen Saturation (Admit) 99 %    Oxygen Saturation (Exercise) 90 %    Oxygen Saturation (Exit) 98 %    Rating of Perceived Exertion (Exercise) 11    Perceived Dyspnea (Exercise) 2    Duration Continue with 30 min of aerobic exercise without signs/symptoms of physical distress.    Intensity THRR unchanged      Progression   Progression Continue to progress workloads to maintain intensity without signs/symptoms of physical distress.      Resistance Training   Training Prescription Yes    Weight red bands    Reps 10-15    Time 10 Minutes      NuStep   Level 4    SPM 80    Minutes 15    METs 2.1      Track   Laps 14    Minutes 15  Functional Capacity:  6 Minute Walk     Row Name 03/03/21 1550 05/09/21 1503       6 Minute Walk   Phase Initial Discharge    Distance 975 feet 1061 feet    Distance % Change -- 8.82 %    Distance Feet Change -- 86 ft    Walk Time 6 minutes 6 minutes    # of Rest Breaks 0 0    MPH 1.85 2.01    METS 2.26 1.93    RPE 11 10    Perceived Dyspnea  1 1    VO2 Peak 7.91 6.75    Symptoms No No    Resting HR 74 bpm 80 bpm    Resting BP 130/62 144/62    Resting Oxygen Saturation  97 % 96 %    Exercise Oxygen Saturation  during 6 min walk 92 % 91 %    Max Ex. HR 126 bpm 107 bpm    Max Ex. BP 172/70 142/68    2 Minute Post BP 132/60 130/64         Interval HR   1 Minute HR 100 100    2 Minute HR 111 99    3 Minute HR 117 105    4 Minute HR 119 103    5 Minute HR 121 107    6 Minute HR 126 104    2 Minute Post HR 88 84    Interval Heart Rate? Yes Yes         Interval Oxygen   Interval Oxygen? Yes Yes    Baseline Oxygen Saturation % 97 % 96 %    1 Minute Oxygen Saturation % 92 % 96 %    1 Minute Liters of Oxygen 0 L 0 L    2 Minute Oxygen Saturation % 92 % 93 %    2 Minute Liters of Oxygen 0 L 0 L     3 Minute Oxygen Saturation % 94 % 93 %    3 Minute Liters of Oxygen 0 L 0 L    4 Minute Oxygen Saturation % 93 % 93 %    4 Minute Liters of Oxygen 0 L 0 L    5 Minute Oxygen Saturation % 93 % 94 %    5 Minute Liters of Oxygen 0 L 0 L    6 Minute Oxygen Saturation % 93 % 91 %    6 Minute Liters of Oxygen 0 L 0 L    2 Minute Post Oxygen Saturation % 97 % 98 %    2 Minute Post Liters of Oxygen 0 L 0 L             Psychological, QOL, Others - Outcomes: PHQ 2/9: Depression screen [facility] 2/9 05/09/2021 03/03/2021 06/27/2020 05/25/2020 04/20/2020  Decreased Interest 0 0 0 1 0  Down, Depressed, Hopeless 1 1 1 1 1   PHQ - 2 Score 1 1 1 2 1   Altered sleeping 1 1 0 1 1  Tired, decreased energy 1 1 1 1 1   Change in appetite 2 0 1 2 1   Feeling bad or failure about yourself  0 1 0 1 0  Trouble concentrating 0 0 0 1 1  Moving slowly or fidgety/restless 0 0 0 0 0  Suicidal thoughts 0 0 0 0 0  PHQ-9 Score 5 - 3 8 5   Difficult doing work/chores Somewhat difficult Somewhat difficult Not difficult at all Somewhat difficult Somewhat difficult  Quality of Life:   Personal Goals: Goals established at orientation with interventions provided to work toward goal.  Personal Goals and Risk Factors at Admission - 03/03/21 1603       Core Components/Risk Factors/Patient Goals on Admission    Weight Management Weight Loss    Improve shortness of breath with ADL's Yes    Intervention Provide education, individualized exercise plan and daily activity instruction to help decrease symptoms of SOB with activities of daily living.    Expected Outcomes Short Term: Improve cardiorespiratory fitness to achieve a reduction of symptoms when performing ADLs;Long Term: Be able to perform more ADLs without symptoms or delay the onset of symptoms    Hypertension Yes    Intervention Provide education on lifestyle modifcations including regular physical activity/exercise, weight management, moderate sodium restriction and  increased consumption of fresh fruit, vegetables, and low fat dairy, alcohol moderation, and smoking cessation.;Monitor prescription use compliance.    Expected Outcomes Short Term: Continued assessment and intervention until BP is < 140/18mm HG in hypertensive participants. < 130/5mm HG in hypertensive participants with diabetes, heart failure or chronic kidney disease.;Long Term: Maintenance of blood pressure at goal levels.              Personal Goals Discharge:  Goals and Risk Factor Review     Row Name 03/27/21 1427 04/24/21 1440           Core Components/Risk Factors/Patient Goals Review   Personal Goals Review Develop more efficient breathing techniques such as purse lipped breathing and diaphragmatic breathing and practicing self-pacing with activity.;Increase knowledge of respiratory medications and ability to use respiratory devices properly.;Improve shortness of breath with ADL's Develop more efficient breathing techniques such as purse lipped breathing and diaphragmatic breathing and practicing self-pacing with activity.;Increase knowledge of respiratory medications and ability to use respiratory devices properly.;Improve shortness of breath with ADL's      Review Sama has attended 5 exercise sessions, too early to see progression towards program goals. Diamonique has profressed to walking 13-16 laps on the track in 15 mins and exercising at level 4 on the nustep.  She has made great progress and has increased her strength and stamina.      Expected Outcomes See admission goals. See admission goals.               Exercise Goals and Review:  Exercise Goals     Row Name 03/03/21 1535 04/25/21 0956           Exercise Goals   Increase Physical Activity Yes Yes      Intervention Provide advice, education, support and counseling about physical activity/exercise needs.;Develop an individualized exercise prescription for aerobic and resistive training based on initial  evaluation findings, risk stratification, comorbidities and participant's personal goals. Provide advice, education, support and counseling about physical activity/exercise needs.;Develop an individualized exercise prescription for aerobic and resistive training based on initial evaluation findings, risk stratification, comorbidities and participant's personal goals.      Expected Outcomes Short Term: Attend rehab on a regular basis to increase amount of physical activity.;Long Term: Add in home exercise to make exercise part of routine and to increase amount of physical activity.;Long Term: Exercising regularly at least 3-5 days a week. Short Term: Attend rehab on a regular basis to increase amount of physical activity.;Long Term: Add in home exercise to make exercise part of routine and to increase amount of physical activity.;Long Term: Exercising regularly at least 3-5 days a week.  Increase Strength and Stamina Yes Yes      Intervention Provide advice, education, support and counseling about physical activity/exercise needs.;Develop an individualized exercise prescription for aerobic and resistive training based on initial evaluation findings, risk stratification, comorbidities and participant's personal goals. Provide advice, education, support and counseling about physical activity/exercise needs.;Develop an individualized exercise prescription for aerobic and resistive training based on initial evaluation findings, risk stratification, comorbidities and participant's personal goals.      Expected Outcomes Short Term: Increase workloads from initial exercise prescription for resistance, speed, and METs.;Short Term: Perform resistance training exercises routinely during rehab and add in resistance training at home;Long Term: Improve cardiorespiratory fitness, muscular endurance and strength as measured by increased METs and functional capacity ( ) Short Term: Increase workloads from initial exercise  prescription for resistance, speed, and METs.;Short Term: Perform resistance training exercises routinely during rehab and add in resistance training at home;Long Term: Improve cardiorespiratory fitness, muscular endurance and strength as measured by increased METs and functional capacity ( )      Able to understand and use rate of perceived exertion (RPE) scale Yes Yes      Intervention Provide education and explanation on how to use RPE scale Provide education and explanation on how to use RPE scale      Expected Outcomes Long Term:  Able to use RPE to guide intensity level when exercising independently;Short Term: Able to use RPE daily in rehab to express subjective intensity level Long Term:  Able to use RPE to guide intensity level when exercising independently;Short Term: Able to use RPE daily in rehab to express subjective intensity level      Able to understand and use Dyspnea scale Yes Yes      Intervention Provide education and explanation on how to use Dyspnea scale Provide education and explanation on how to use Dyspnea scale      Expected Outcomes Short Term: Able to use Dyspnea scale daily in rehab to express subjective sense of shortness of breath during exertion;Long Term: Able to use Dyspnea scale to guide intensity level when exercising independently Short Term: Able to use Dyspnea scale daily in rehab to express subjective sense of shortness of breath during exertion;Long Term: Able to use Dyspnea scale to guide intensity level when exercising independently      Knowledge and understanding of Target Heart Rate Range (THRR) Yes Yes      Intervention Provide education and explanation of THRR including how the numbers were predicted and where they are located for reference Provide education and explanation of THRR including how the numbers were predicted and where they are located for reference      Expected Outcomes Short Term: Able to state/look up THRR;Long Term: Able to use THRR to  govern intensity when exercising independently;Short Term: Able to use daily as guideline for intensity in rehab Short Term: Able to state/look up THRR;Long Term: Able to use THRR to govern intensity when exercising independently;Short Term: Able to use daily as guideline for intensity in rehab      Understanding of Exercise Prescription Yes Yes      Intervention Provide education, explanation, and written materials on patient's individual exercise prescription Provide education, explanation, and written materials on patient's individual exercise prescription      Expected Outcomes Short Term: Able to explain program exercise prescription;Long Term: Able to explain home exercise prescription to exercise independently Short Term: Able to explain program exercise prescription;Long Term: Able to explain home exercise prescription to exercise independently  Exercise Goals Re-Evaluation:  Exercise Goals Re-Evaluation     Row Name 03/27/21 1644 04/25/21 0956           Exercise Goal Re-Evaluation   Exercise Goals Review Increase Physical Activity;Increase Strength and Stamina;Able to understand and use rate of perceived exertion (RPE) scale;Able to understand and use Dyspnea scale;Knowledge and understanding of Target Heart Rate Range (THRR);Understanding of Exercise Prescription Increase Physical Activity;Increase Strength and Stamina;Able to understand and use rate of perceived exertion (RPE) scale;Able to understand and use Dyspnea scale;Knowledge and understanding of Target Heart Rate Range (THRR);Understanding of Exercise Prescription      Comments Tayllor has completed 5 exercise sessions and has tolerated well so far. In the short time she has been here she has already progressed her workload and METS on the Nustep and the track. She is exercising at 1.9 METS on the Nustep and 2.74 METS walking the track. Will continue to monitor and progress as she is able. Inette has completed 10  exercise sessions and has tolerated well. She continues to progress on the Nustep and track. She averages 2.3 METs on the Nustep and 3.09 METs on the track. Tiffani performs the warm up and resistance exercises/ cool down standing without limitations. Will continue to monitor and progress as she is able.      Expected Outcomes Through exercise at rehab and home, the patient will decrease shortness of breath with daily activities and feel confident in carrying out an exercise regimn at home. Through exercise at rehab and home, the patient will decrease shortness of breath with daily activities and feel confident in carrying out an exercise regimn at home.               Nutrition & Weight - Outcomes:  Pre Biometrics - 03/03/21 1535       Pre Biometrics   Grip Strength 27 kg             Post Biometrics - 05/09/21 1508        Post  Biometrics   Weight 94 kg    Grip Strength 28 kg             Nutrition:  Nutrition Therapy & Goals - 03/17/21 1516       Nutrition Therapy   Diet TLC      Personal Nutrition Goals   Nutrition Goal Pt to identify food quantities necessary to achieve weight loss of 6-24 lb at graduation from cardiac rehab.    Personal Goal #2 Pt to build a healthy plate including vegetables, fruits, whole grains, and low-fat dairy products in a heart healthy meal plan.      Intervention Plan   Intervention Prescribe, educate and counsel regarding individualized specific dietary modifications aiming towards targeted core components such as weight, hypertension, lipid management, diabetes, heart failure and other comorbidities.;Nutrition handout(s) given to patient.    Expected Outcomes Long Term Goal: Adherence to prescribed nutrition plan.;Short Term Goal: Understand basic principles of dietary content, such as calories, fat, sodium, cholesterol and nutrients.             Nutrition Discharge:   Education Questionnaire Score:  Knowledge Questionnaire  Score - 05/09/21 1518       Knowledge Questionnaire Score   Post Score 14/18             Goals reviewed with patient; copy given to patient.

## 2021-05-19 ENCOUNTER — Other Ambulatory Visit: Payer: Self-pay

## 2021-06-01 DIAGNOSIS — H04123 Dry eye syndrome of bilateral lacrimal glands: Secondary | ICD-10-CM | POA: Diagnosis not present

## 2021-06-01 DIAGNOSIS — Z9889 Other specified postprocedural states: Secondary | ICD-10-CM | POA: Diagnosis not present

## 2021-06-01 DIAGNOSIS — H532 Diplopia: Secondary | ICD-10-CM | POA: Diagnosis not present

## 2021-06-13 ENCOUNTER — Other Ambulatory Visit: Payer: Self-pay

## 2021-06-14 ENCOUNTER — Ambulatory Visit (INDEPENDENT_AMBULATORY_CARE_PROVIDER_SITE_OTHER): Payer: Medicare Other | Admitting: Family Medicine

## 2021-06-14 ENCOUNTER — Encounter: Payer: Self-pay | Admitting: Family Medicine

## 2021-06-14 VITALS — BP 142/78 | HR 78 | Temp 97.9°F | Wt 208.4 lb

## 2021-06-14 DIAGNOSIS — R635 Abnormal weight gain: Secondary | ICD-10-CM | POA: Diagnosis not present

## 2021-06-14 DIAGNOSIS — F419 Anxiety disorder, unspecified: Secondary | ICD-10-CM

## 2021-06-14 DIAGNOSIS — J4520 Mild intermittent asthma, uncomplicated: Secondary | ICD-10-CM

## 2021-06-14 DIAGNOSIS — I10 Essential (primary) hypertension: Secondary | ICD-10-CM | POA: Diagnosis not present

## 2021-06-14 MED ORDER — TELMISARTAN 20 MG PO TABS: 20.0000 mg | ORAL_TABLET | Freq: Every day | ORAL | 3 refills | Status: DC

## 2021-06-14 NOTE — Patient Instructions (Addendum)
You can start taking one half tab (12.5 mg) of metoprolol tartrate twice a day for the next 2 weeks.  At the end of the 2 weeks you can stop the metoprolol.  While you are cutting down the dose of metoprolol continue taking hydrochlorothiazide 12.5 mg and the new medication telmisartan 20 mg.  Keep checking your blood pressure at home and keeping a log of the readings.  It appears that you have refills on hydrochlorothiazide until April 2023.  Check with [facility] to see if this is accurate.  Counseling info: Dennison Bulla, [place]

## 2021-06-14 NOTE — Progress Notes (Signed)
Subjective:    Patient ID: Suzanne Conner, female    DOB: 1946-01-09, 75 y.o.   MRN: [id number]  Chief Complaint  Patient presents with   Follow-up    BP    HPI Patient was seen today for HTN.  Pt states bp 140s-150s/70s-80s at home.  Pt has yet to take meds this am.  States expected bp to be elevated this visit as she has been up all night anxious about today's appt. Pt unsure why.  Pt frustrated as [facility]  Pt also made changes to diet. Still having intermittent SOB, followed by Pulmonology.  Completed Pulm rehab.  Pt concerned Metoprolol may be contributing to symptoms.    Past Medical History:  Diagnosis Date   Arthritis    "left hip; right wrist" (06/12/2018)   Asthma    Depression    Family history of polyps in the colon    GERD (gastroesophageal reflux disease)    Glaucoma    Hypertension    Type 2 diabetes, diet controlled ([place])     Allergies  Allergen Reactions   Penicillins Hives and Other (See Comments)    PATIENT HAS HAD A PCN REACTION WITH IMMEDIATE RASH, FACIAL/TONGUE/THROAT SWELLING, SOB, OR LIGHTHEADEDNESS WITH HYPOTENSION:  #  #  YES  #  #  INCLUDING HIVES AS A CHILD Has patient had a PCN reaction causing severe rash involving mucus membranes or skin necrosis: No Has patient had a PCN reaction that required hospitalization: No Has patient had a PCN reaction occurring within the last 10 years: No If all of the above answers are "NO", then may proceed with Cephalosporin use. PATIENT HAS HAD A PCN REACTION WITH IMMEDIATE RASH, FACIAL/TONGUE/THROAT SWELLING, SOB, OR LIGHTHEADEDNESS WITH HYPOTENSION:  #  #  YES  #  #  INCLUDING HIVES AS A CHILD Has patient had a PCN reaction causing severe rash involving mucus membranes or skin necrosis: No Has patient had a PCN reaction that required hospitalization: No Has patient had a PCN reaction occurring within the last 10 years: No If all of the above answers are "NO", then may  proceed with Cephalosporin use.    Paxil [Paroxetine]     Paxil 20 mg caused a tremor.    ROS General: Denies fever, chills, night sweats, changes in appetite  +changes in weight HEENT: Denies headaches, ear pain, changes in vision, rhinorrhea, sore throat CV: Denies CP, palpitations, SOB, orthopnea +SOB Pulm: Denies SOB, cough, wheezing +SOB GI: Denies abdominal pain, nausea, vomiting, diarrhea, constipation GU: Denies dysuria, hematuria, frequency, vaginal discharge Msk: Denies muscle cramps, joint pains Neuro: Denies weakness, numbness, tingling Skin: Denies rashes, bruising Psych: Denies depression, anxiety, hallucinations +anxiety  Objective:    Blood pressure (!) 142/78, pulse 78, temperature 97.9 F (36.6 C), temperature source Oral, weight 208 lb 6.4 oz (94.5 kg), SpO2 98 %.  Gen. Pleasant, well-nourished, in no distress, euthymic.   HEENT: [place]/AT, face symmetric, conjunctiva clear, no scleral icterus, PERRLA, EOMI, nares patent without drainage Lungs: no accessory muscle use, CTAB, no wheezes or rales Cardiovascular: RRR, no m/r/g, no peripheral edema Musculoskeletal: No deformities, no cyanosis or clubbing, normal tone Neuro:  A&Ox3, CN II-XII intact, normal gait Skin:  Warm, no lesions/ rash  Wt Readings from Last 3 Encounters:  06/14/21 208 lb 6.4 oz (94.5 kg)  05/11/21 206 lb (93.4 kg)  05/09/21 207 lb 3.7 oz (94 kg)    Lab Results  Component Value Date  WBC 7.9 05/11/2021   HGB 13.3 05/11/2021   HCT 40.1 05/11/2021   PLT 292.0 05/11/2021   GLUCOSE 103 (H) 05/11/2021   CHOL 127 05/11/2021   TRIG 62.0 05/11/2021   HDL 53.50 05/11/2021   LDLCALC 61 05/11/2021   ALT 18 05/11/2021   AST 19 05/11/2021   NA 142 05/11/2021   K 4.6 05/11/2021   CL 102 05/11/2021   CREATININE 0.65 05/11/2021   BUN 15 05/11/2021   CO2 31 05/11/2021   TSH 1.00 05/11/2021   HGBA1C 5.9 05/11/2021    Assessment/Plan:  Essential hypertension  -elevated -pt encouraged to  take medication this am -given concern Metoprolol may be causing s/e -will start weaning off Metoprolol tartrate 25 mg BID.  Pt to take 12.5 mg of Metoprolol tartrate BID x 2 wks, then stop metoprolol tartarte.  Continue current medications including HCTZ 12.5 mg daily. -will start telmisartan 20 mg daily. -continue checking bp at home and keeping a log of readings. - Plan: telmisartan (MICARDIS) 20 MG tablet  Weight gain -continue lifestyle modifications -discussed possible causes including medications, thyroid dysfunction, vitamin def., hormone changes, depression. -continue lifestyle modifications -TSH normal 05/11/21 -working to wean off metoprolol tartrate. -pt encouraged to consider counseling.  Anxiety -discussed anxiety may be contributing to elevated bp, SOB, and other concerns. -encouraged to strongly consider counseling. -continue to monitor  Mild intermittent asthma without complication -stable -continue current meds advair and albuterol -continue f/u with Pulmonology  F/u in 5 wks, sooner if needed.  Grier Mitts, MD

## 2021-06-19 ENCOUNTER — Telehealth: Payer: Self-pay | Admitting: *Deleted

## 2021-06-19 NOTE — Chronic Care Management (AMB) (Signed)
  Chronic Care Management   Note  06/19/2021 Name: Suzanne Conner MRN: [id number] DOB: 02-18-1946  Suzanne Conner is a 74 y.o. year old female who is a primary care patient of Billie Ruddy, MD. I reached out to Valeda Malm by phone today in response to a referral sent by Ms. Suzanne Conner's PCP.  Suzanne Conner was given information about Chronic Care Management services today including:  CCM service includes personalized support from designated clinical staff supervised by her physician, including individualized plan of care and coordination with other care providers 24/7 contact phone numbers for assistance for urgent and routine care needs. Service will only be billed when office clinical staff spend 20 minutes or more in a month to coordinate care. Only one practitioner may furnish and bill the service in a calendar month. The patient may stop CCM services at any time (effective at the end of the month) by phone call to the office staff. The patient is responsible for co-pay (up to 20% after annual deductible is met) if co-pay is required by the individual health plan.   Patient agreed to services and verbal consent obtained.   Follow up plan: Patient declines further follow up and engagement by the care management team. Appropriate care team members and provider have been notified via electronic communication.  The care management team is available to follow up with the patient after provider conversation with the patient regarding recommendation for care management engagement and subsequent re-referral to the care management team.   [facility]  Direct Dial: [phone]

## 2021-06-27 ENCOUNTER — Encounter: Payer: Self-pay | Admitting: Family Medicine

## 2021-07-18 ENCOUNTER — Other Ambulatory Visit: Payer: Self-pay

## 2021-07-19 ENCOUNTER — Ambulatory Visit: Payer: Medicare Other | Admitting: Family Medicine

## 2021-07-19 ENCOUNTER — Ambulatory Visit (INDEPENDENT_AMBULATORY_CARE_PROVIDER_SITE_OTHER): Payer: Medicare Other | Admitting: Family Medicine

## 2021-07-19 VITALS — BP 136/64 | HR 95 | Temp 98.3°F | Wt 207.8 lb

## 2021-07-19 DIAGNOSIS — I10 Essential (primary) hypertension: Secondary | ICD-10-CM

## 2021-07-19 DIAGNOSIS — F419 Anxiety disorder, unspecified: Secondary | ICD-10-CM | POA: Diagnosis not present

## 2021-07-19 DIAGNOSIS — R635 Abnormal weight gain: Secondary | ICD-10-CM | POA: Diagnosis not present

## 2021-07-19 DIAGNOSIS — R002 Palpitations: Secondary | ICD-10-CM | POA: Diagnosis not present

## 2021-07-19 DIAGNOSIS — R0602 Shortness of breath: Secondary | ICD-10-CM

## 2021-07-19 NOTE — Progress Notes (Signed)
Subjective:    Patient ID: Suzanne Conner, female    DOB: 1946-02-20, 75 y.o.   MRN: [id number]  Chief Complaint  Patient presents with   Follow-up    BP, anxiety, wt gain    HPI Patient was seen today for follow-up and ongoing concerns.  Patient endorses weight gain, intermittent dizziness, and the sensation of her nose being runny, but it is not.  Patient concerned about her heart rate being in the high 90s and feels metoprolol can be contributing to symptoms.  Patient mentions being more aware of her heartbeat and having SOB with activity.  Pt seen by Pulm, completed pulmonary rehab.  Pt frustrated that she has not lost weight despite making lifestyle changes.  Past Medical History:  Diagnosis Date   Arthritis    "left hip; right wrist" (06/12/2018)   Asthma    Depression    Family history of polyps in the colon    GERD (gastroesophageal reflux disease)    Glaucoma    Hypertension    Type 2 diabetes, diet controlled ([place])     Allergies  Allergen Reactions   Penicillins Hives and Other (See Comments)    PATIENT HAS HAD A PCN REACTION WITH IMMEDIATE RASH, FACIAL/TONGUE/THROAT SWELLING, SOB, OR LIGHTHEADEDNESS WITH HYPOTENSION:  #  #  YES  #  #  INCLUDING HIVES AS A CHILD Has patient had a PCN reaction causing severe rash involving mucus membranes or skin necrosis: No Has patient had a PCN reaction that required hospitalization: No Has patient had a PCN reaction occurring within the last 10 years: No If all of the above answers are "NO", then may proceed with Cephalosporin use. PATIENT HAS HAD A PCN REACTION WITH IMMEDIATE RASH, FACIAL/TONGUE/THROAT SWELLING, SOB, OR LIGHTHEADEDNESS WITH HYPOTENSION:  #  #  YES  #  #  INCLUDING HIVES AS A CHILD Has patient had a PCN reaction causing severe rash involving mucus membranes or skin necrosis: No Has patient had a PCN reaction that required hospitalization: No Has patient had a PCN reaction occurring within the last 10 years: No If all  of the above answers are "NO", then may proceed with Cephalosporin use.    Paxil [Paroxetine]     Paxil 20 mg caused a tremor.    ROS General: Denies fever, chills, night sweats, changes in weight, changes in appetite + dizziness, weight gain HEENT: Denies headaches, ear pain, changes in vision, rhinorrhea, sore throat + sensation of rhinorrhea without rhinorrhea CV: Denies CP, SOB, orthopnea + palpitations, SOB Pulm: Denies SOB, cough, wheezing + SOB GI: Denies abdominal pain, nausea, vomiting, diarrhea, constipation GU: Denies dysuria, hematuria, frequency, vaginal discharge Msk: Denies muscle cramps, joint pains Neuro: Denies weakness, numbness, tingling Skin: Denies rashes, bruising Psych: Denies depression, anxiety, hallucinations + anxiety   Objective:    Blood pressure 136/64, pulse 95, temperature 98.3 F (36.8 C), temperature source Oral, weight 207 lb 12.8 oz (94.3 kg), SpO2 98 %.  Gen. Pleasant, well-nourished, in no distress, normal affect   HEENT: [place]/AT, face symmetric, conjunctiva clear, no scleral icterus, PERRLA, EOMI, nares patent without drainage, pharynx without erythema or exudate.  TMs normal. Neck: No JVD, no thyromegaly, no carotid bruits Lungs: no accessory muscle use, CTAB, no wheezes or rales Cardiovascular: RRR, no m/r/g, no peripheral edema Musculoskeletal: No deformities, no cyanosis or clubbing, normal tone Neuro:  A&Ox3, CN II-XII intact, normal gait Skin:  Warm, no lesions/ rash   Wt Readings from Last 3 Encounters:  07/19/21 207  lb 12.8 oz (94.3 kg)  06/14/21 208 lb 6.4 oz (94.5 kg)  05/11/21 206 lb (93.4 kg)    Lab Results  Component Value Date   WBC 7.9 05/11/2021   HGB 13.3 05/11/2021   HCT 40.1 05/11/2021   PLT 292.0 05/11/2021   GLUCOSE 103 (H) 05/11/2021   CHOL 127 05/11/2021   TRIG 62.0 05/11/2021   HDL 53.50 05/11/2021   LDLCALC 61 05/11/2021   ALT 18 05/11/2021   AST 19 05/11/2021   NA 142 05/11/2021   K 4.6 05/11/2021    CL 102 05/11/2021   CREATININE 0.65 05/11/2021   BUN 15 05/11/2021   CO2 31 05/11/2021   TSH 1.00 05/11/2021   HGBA1C 5.9 05/11/2021    Assessment/Plan:  Essential hypertension -Elevated -We will discontinue metoprolol 25 mg twice daily as patient concerned it may be contributing to her symptoms. -Continue hydrochlorothiazide and telmisartan 20 mg. -Patient advised to check BP at home and keep a log to bring with you to clinic as we will likely need to adjust medication -Continue lifestyle modifications -Given precautions  SOB (shortness of breath) on exertion  -Has history of asthma and seen by pulmonology. -Discussed deconditioning 2/2 recent weight gain versus cardiac etiology -We will refer to cardiology for echo and possible Holter monitor. - Plan: Ambulatory referral to Cardiology  Palpitations -Discussed possible causes including anxiety, caffeine intake, thyroid dysfunction, PVCs, etc. -TSH normal 05/11/2021 -Hemoglobin A1c 5.9% on 05/11/2021. - Plan: Ambulatory referral to Cardiology  Weight gain -Continue lifestyle modifications -Consider nutrition consult -Also encouraged to consider counseling  Anxiety -PHQ-9 score 5 -GAD-7 score 5 -Counseling encouraged -Continue to monitor  F/u as needed in the next 2-4 weeks, sooner if needed  Grier Mitts, MD

## 2021-07-22 NOTE — Progress Notes (Signed)
Cardiology Office Note:    Date:  07/24/2021   ID:  Suzanne Conner, DOB 10/21/1945, MRN [id number]  PCP:  Billie Ruddy, MD  Cardiologist:  Early Osmond, MD  Electrophysiologist:  None   Referring MD: Billie Ruddy, MD   Chief Complaint/Reason for Referral: Dyspnea and palpitations  History of Present Illness:    Suzanne Conner is a 75 y.o. female with the indicated history patient was seen in October by her primary care provider.  At that time she was endorsing shortness of breath but no exertional chest pain.  Her blood pressure was mildly elevated above goal.  Her metoprolol was stopped because this was thought to be contributing to her symptoms possibly.  She was started on telmisartan.  She tells me that she has been more short of breath over the last several months.  This typically happens happens with exertion.  She has gained about approximately 20 pounds since the pandemic started.  She has noticed this shortness of breath happening near the end of the pandemic.  She has also had an exacerbation of her childhood asthma.  She is now on medication for this.  She occasionally wheezes but this is pretty rare.  This shortness of breath she is endorsing today is separate from her wheezing.  She denies any significant edema, paroxysmal nocturnal dyspnea, orthopnea, exertional angina, presyncope or syncope.  She has very rare palpitations that last seconds and may be happening once or twice a week.  They are not not associated with lightheadedness, shortness of breath, or chest pain.  She has required no emergency room visits or hospitalizations.  Past Medical History:  Diagnosis Date   Arthritis    "left hip; right wrist" (06/12/2018)   Asthma    Depression    Family history of polyps in the colon    GERD (gastroesophageal reflux disease)    Glaucoma    Hypertension    Type 2 diabetes, diet controlled ([place])     Past Surgical History:  Procedure Laterality Date    BUNIONECTOMY Right    CATARACT EXTRACTION W/ INTRAOCULAR LENS IMPLANT Right    JOINT REPLACEMENT     REPLACEMENT TOTAL KNEE Right 2015   REVERSE SHOULDER ARTHROPLASTY Left 06/12/2018   REVERSE SHOULDER ARTHROPLASTY Left 06/12/2018   Procedure: LEFT REVERSE SHOULDER ARTHROPLASTY;  Surgeon: Justice Britain, MD;  Location: [facility];  Service: Orthopedics;  Laterality: Left;   SHOULDER ARTHROSCOPY W/ ROTATOR CUFF REPAIR Right 2015   TUBAL LIGATION      Current Medications: Current Meds  Medication Sig   albuterol (VENTOLIN HFA) 108 (90 Base) MCG/ACT inhaler Inhale 2 puffs into the lungs every 6 (six) hours as needed for wheezing or shortness of breath.   aspirin EC 81 MG tablet Take 81 mg by mouth daily.   Cholecalciferol (VITAMIN D) 2000 units CAPS Take 2,000 Units by mouth daily.   clindamycin (CLEOCIN) 300 MG capsule Take 300 mg by mouth 2 (two) times daily. Prior to dental appointments   Clobetasol Propionate 0.05 % shampoo Use a small amount on the affected areas twice a day as needed.   fluticasone-salmeterol (ADVAIR HFA) 230-21 MCG/ACT inhaler Inhale 2 puffs into the lungs 2 (two) times daily.   hydrochlorothiazide (HYDRODIURIL) 12.5 MG tablet TAKE 1 TABLET DAILY   hydrocortisone valerate cream (WESTCORT) 0.2 % Apply 1 application topically 2 (two) times daily.   latanoprost (XALATAN) 0.005 % ophthalmic solution Apply to eye.   Melatonin 10 MG TABS Take  1 tablet by mouth at bedtime as needed.   Multiple Vitamins-Minerals (CENTRUM SILVER PO) Take 1 tablet by mouth daily.    omeprazole (PRILOSEC) 40 MG capsule TAKE 1 CAPSULE TWICE A DAY   Probiotic Product (TRUNATURE DIGESTIVE PROBIOTIC) CAPS Take 1 capsule by mouth daily.    telmisartan (MICARDIS) 20 MG tablet Take 1 tablet (20 mg total) by mouth daily.     Allergies:    Allergies  Allergen Reactions   Penicillins Hives and Other (See Comments)    PATIENT HAS HAD A PCN REACTION WITH IMMEDIATE RASH, FACIAL/TONGUE/THROAT SWELLING, SOB, OR  LIGHTHEADEDNESS WITH HYPOTENSION:  #  #  YES  #  #  INCLUDING HIVES AS A CHILD Has patient had a PCN reaction causing severe rash involving mucus membranes or skin necrosis: No Has patient had a PCN reaction that required hospitalization: No Has patient had a PCN reaction occurring within the last 10 years: No If all of the above answers are "NO", then may proceed with Cephalosporin use. PATIENT HAS HAD A PCN REACTION WITH IMMEDIATE RASH, FACIAL/TONGUE/THROAT SWELLING, SOB, OR LIGHTHEADEDNESS WITH HYPOTENSION:  #  #  YES  #  #  INCLUDING HIVES AS A CHILD Has patient had a PCN reaction causing severe rash involving mucus membranes or skin necrosis: No Has patient had a PCN reaction that required hospitalization: No Has patient had a PCN reaction occurring within the last 10 years: No If all of the above answers are "NO", then may proceed with Cephalosporin use.    Paxil [Paroxetine]     Paxil 20 mg caused a tremor.    Social History   Tobacco Use   Smoking status: Never   Smokeless tobacco: Never  Vaping Use   Vaping Use: Never used  Substance Use Topics   Alcohol use: Yes    Alcohol/week: 5.0 standard drinks    Types: 5 Glasses of wine per week   Drug use: Never    Comment: 06/12/2018 CBD oil for pain     Family History: Family History  Problem Relation Age of Onset   Asthma Brother    Diabetes Brother    Dementia Mother    Cancer Father    Heart disease Sister    Diabetes Brother    Diabetes Brother    Healthy Son    Healthy Daughter      ROS:   Please see the history of present illness.    All other systems reviewed and are negative.  EKGs/Labs/Other Studies Reviewed:    The following studies were reviewed today:  EKG: EKG from September 2022 demonstrates normal sinus rhythm with inferior infarct pattern possible LVH.  Imaging studies that I have independently reviewed today: No prior cardiac imaging  Recent Labs: 05/11/2021: ALT 18; BUN 15; Creatinine, Ser  0.65; Hemoglobin 13.3; Platelets 292.0; Potassium 4.6; Sodium 142; TSH 1.00   Recent Lipid Panel    Component Value Date/Time   CHOL 127 05/11/2021 1019   TRIG 62.0 05/11/2021 1019   HDL 53.50 05/11/2021 1019   CHOLHDL 2 05/11/2021 1019   VLDL 12.4 05/11/2021 1019   LDLCALC 61 05/11/2021 1019   LDLCALC 76 03/18/2020 1135    Physical Exam:    VS:  BP (!) 148/70   Pulse 84   Ht 5\' 3"  (1.6 m)   Wt 210 lb 3.2 oz (95.3 kg)   SpO2 99%   BMI 37.24 kg/m     Wt Readings from Last 5 Encounters:  07/24/21 210 lb  3.2 oz (95.3 kg)  07/19/21 207 lb 12.8 oz (94.3 kg)  06/14/21 208 lb 6.4 oz (94.5 kg)  05/11/21 206 lb (93.4 kg)  05/09/21 207 lb 3.7 oz (94 kg)    GENERAL:  No apparent distress, AOx3 HEENT:  No carotid bruits, +2 carotid impulses, no scleral icterus CAR: RRR no murmurs, gallops, rubs, or thrills RES:  Clear to auscultation bilaterally ABD:  Soft, nontender, nondistended, positive bowel sounds x 4 VASC:  +2 radial pulses, +2 carotid pulses, palpable pedal pulses NEURO:  CN 2-12 grossly intact; motor and sensory grossly intact PSYCH:  No active depression or anxiety EXT:  No edema, ecchymosis, or cyanosis  ASSESSMENT:    1. Dyspnea, unspecified type   2. Palpitations   3. Type 2 diabetes mellitus with complication, without long-term current use of insulin ([place])   4. Hyperlipidemia, unspecified hyperlipidemia type    PLAN:    Dyspnea, unspecified type Will obtain echocardiogram.  This may be from weight gain, pulmonary disease, or diastolic dysfunction.  I will start her on empiric Lasix 10 mg twice daily as she is Lasix nave and check a BMP next week.  I will see the patient back in 3 months or earlier if needed  Palpitations These are fairly rare.  We will monitor for now.  If needed we can place a monitor.  Type 2 diabetes mellitus with complication, without long-term current use of insulin (HCC) We will start the patient on Jardiance 10 mg daily (which has  benefits regarding diastolic dysfunction as well).  She is on aspirin as well as an ARB.  She should be on a statin.  We will start atorvastatin 40 mg daily for goal LDL is less than 70 since diabetes is a coronary artery equivalent.  Even though her LDL is at goal, will prescribe due to pleiotropic effects.  Hyperlipidemia, unspecified hyperlipidemia type See above we will start atorvastatin; check lipid panel and LFTs next year.   Shared Decision Making/Informed Consent:       Medication Adjustments/Labs and Tests Ordered: Current medicines are reviewed at length with the patient today.  Concerns regarding medicines are outlined above.   No orders of the defined types were placed in this encounter.   No orders of the defined types were placed in this encounter.   Patient Instructions  Medication Instructions:  Your physician has recommended you make the following change in your medication:  1.) start Jardiance 10 mg - one tablet daily 2.) start furosemide (Lasix) 20 mg - take HALF TABLET TWICE DAILY 3.) start atorvastatin (Lipitor) 40 mg - one tablet daily  *If you need a refill on your cardiac medications before your next appointment, please call your pharmacy*   Lab Work: Please return in one week for lab work Artist)  If you have labs (blood work) drawn today and your tests are completely normal, you will receive your results only by: [employer] (if you have MyChart) OR A paper copy in the mail If you have any lab test that is abnormal or we need to change your treatment, we will call you to review the results.   Testing/Procedures: Your physician has requested that you have an echocardiogram. Echocardiography is a painless test that uses sound waves to create images of your heart. It provides your doctor with information about the size and shape of your heart and how well your heart's chambers and valves are working. This procedure takes approximately one hour. There  are no restrictions  for this procedure.  Follow-Up: At [facility], you and your health needs are our priority.  As part of our continuing mission to provide you with exceptional heart care, we have created designated Provider Care Teams.  These Care Teams include your primary Cardiologist (physician) and Advanced Practice Providers (APPs -  Physician Assistants and Nurse Practitioners) who all work together to provide you with the care you need, when you need it.  We recommend signing up for the patient portal called "MyChart".  Sign up information is provided on this After Visit Summary.  MyChart is used to connect with patients for Virtual Visits (Telemedicine).  Patients are able to view lab/test results, encounter notes, upcoming appointments, etc.  Non-urgent messages can be sent to your provider as well.   To learn more about what you can do with MyChart, go to [email or web address].    Your next appointment:   3 month(s)  The format for your next appointment:   In Person  Provider:   Early Osmond, MD    Other Instructions

## 2021-07-24 ENCOUNTER — Other Ambulatory Visit: Payer: Self-pay

## 2021-07-24 ENCOUNTER — Encounter: Payer: Self-pay | Admitting: Internal Medicine

## 2021-07-24 ENCOUNTER — Ambulatory Visit (INDEPENDENT_AMBULATORY_CARE_PROVIDER_SITE_OTHER): Payer: Medicare Other | Admitting: Internal Medicine

## 2021-07-24 VITALS — BP 148/70 | HR 84 | Ht 63.0 in | Wt 210.2 lb

## 2021-07-24 DIAGNOSIS — E785 Hyperlipidemia, unspecified: Secondary | ICD-10-CM

## 2021-07-24 DIAGNOSIS — E118 Type 2 diabetes mellitus with unspecified complications: Secondary | ICD-10-CM

## 2021-07-24 DIAGNOSIS — R0600 Dyspnea, unspecified: Secondary | ICD-10-CM

## 2021-07-24 DIAGNOSIS — R002 Palpitations: Secondary | ICD-10-CM | POA: Diagnosis not present

## 2021-07-24 MED ORDER — ATORVASTATIN CALCIUM 40 MG PO TABS: 40.0000 mg | ORAL_TABLET | Freq: Every day | ORAL | 3 refills | Status: DC

## 2021-07-24 MED ORDER — FUROSEMIDE 20 MG PO TABS: 10.0000 mg | ORAL_TABLET | Freq: Two times a day (BID) | ORAL | 3 refills | Status: DC

## 2021-07-24 MED ORDER — EMPAGLIFLOZIN 10 MG PO TABS: 10.0000 mg | ORAL_TABLET | Freq: Every day | ORAL | 5 refills | Status: DC

## 2021-07-24 NOTE — Patient Instructions (Signed)
Medication Instructions:  Your physician has recommended you make the following change in your medication:  1.) start Jardiance 10 mg - one tablet daily 2.) start furosemide (Lasix) 20 mg - take HALF TABLET TWICE DAILY 3.) start atorvastatin (Lipitor) 40 mg - one tablet daily  *If you need a refill on your cardiac medications before your next appointment, please call your pharmacy*   Lab Work: Please return in one week for lab work Artist)  If you have labs (blood work) drawn today and your tests are completely normal, you will receive your results only by: [employer] (if you have MyChart) OR A paper copy in the mail If you have any lab test that is abnormal or we need to change your treatment, we will call you to review the results.   Testing/Procedures: Your physician has requested that you have an echocardiogram. Echocardiography is a painless test that uses sound waves to create images of your heart. It provides your doctor with information about the size and shape of your heart and how well your heart's chambers and valves are working. This procedure takes approximately one hour. There are no restrictions for this procedure.  Follow-Up: At [facility], you and your health needs are our priority.  As part of our continuing mission to provide you with exceptional heart care, we have created designated Provider Care Teams.  These Care Teams include your primary Cardiologist (physician) and Advanced Practice Providers (APPs -  Physician Assistants and Nurse Practitioners) who all work together to provide you with the care you need, when you need it.  We recommend signing up for the patient portal called "MyChart".  Sign up information is provided on this After Visit Summary.  MyChart is used to connect with patients for Virtual Visits (Telemedicine).  Patients are able to view lab/test results, encounter notes, upcoming appointments, etc.  Non-urgent messages can be sent to your  provider as well.   To learn more about what you can do with MyChart, go to [email or web address].    Your next appointment:   3 month(s)  The format for your next appointment:   In Person  Provider:   Early Osmond, MD    Other Instructions

## 2021-08-01 ENCOUNTER — Other Ambulatory Visit: Payer: Self-pay

## 2021-08-01 ENCOUNTER — Encounter: Payer: Self-pay | Admitting: Family Medicine

## 2021-08-01 ENCOUNTER — Other Ambulatory Visit: Payer: Medicare Other | Admitting: *Deleted

## 2021-08-01 DIAGNOSIS — R0600 Dyspnea, unspecified: Secondary | ICD-10-CM | POA: Diagnosis not present

## 2021-08-01 DIAGNOSIS — R002 Palpitations: Secondary | ICD-10-CM

## 2021-08-01 DIAGNOSIS — E118 Type 2 diabetes mellitus with unspecified complications: Secondary | ICD-10-CM

## 2021-08-01 DIAGNOSIS — E785 Hyperlipidemia, unspecified: Secondary | ICD-10-CM | POA: Diagnosis not present

## 2021-08-01 LAB — BASIC METABOLIC PANEL
BUN/Creatinine Ratio: 20 (ref 12–28)
BUN: 18 mg/dL (ref 8–27)
CO2: 26 mmol/L (ref 20–29)
Calcium: 9.2 mg/dL (ref 8.7–10.3)
Chloride: 100 mmol/L (ref 96–106)
Creatinine, Ser: 0.89 mg/dL (ref 0.57–1.00)
Glucose: 102 mg/dL — ABNORMAL HIGH (ref 70–99)
Potassium: 4.1 mmol/L (ref 3.5–5.2)
Sodium: 139 mmol/L (ref 134–144)
eGFR: 68 mL/min/{1.73_m2} (ref >59)

## 2021-08-16 ENCOUNTER — Telehealth: Payer: Self-pay | Admitting: *Deleted

## 2021-08-16 ENCOUNTER — Other Ambulatory Visit: Payer: Self-pay

## 2021-08-16 ENCOUNTER — Ambulatory Visit (HOSPITAL_COMMUNITY): Payer: Medicare Other | Attending: Cardiology

## 2021-08-16 DIAGNOSIS — E785 Hyperlipidemia, unspecified: Secondary | ICD-10-CM | POA: Insufficient documentation

## 2021-08-16 DIAGNOSIS — R002 Palpitations: Secondary | ICD-10-CM | POA: Insufficient documentation

## 2021-08-16 DIAGNOSIS — E118 Type 2 diabetes mellitus with unspecified complications: Secondary | ICD-10-CM | POA: Insufficient documentation

## 2021-08-16 DIAGNOSIS — R0600 Dyspnea, unspecified: Secondary | ICD-10-CM | POA: Diagnosis not present

## 2021-08-16 LAB — ECHOCARDIOGRAM COMPLETE
Area-P 1/2: 3.91 cm2
S' Lateral: 2.8 cm

## 2021-08-16 MED ORDER — FUROSEMIDE 20 MG PO TABS: 20.0000 mg | ORAL_TABLET | Freq: Every day | ORAL | 3 refills | Status: DC

## 2021-08-16 NOTE — Telephone Encounter (Signed)
Message from Dr. Ali Lowe:  Lets increase to 20mg  bid.  Thanks    Called patient and instructed to increase lasix to 20 mg twice daily.  Updated prescription sent to [facility].

## 2021-08-16 NOTE — Telephone Encounter (Signed)
-----   Message from Early Osmond, MD sent at 08/16/2021 11:14 AM EST ----- Let her know echo shows normal function but the relaxing function iof the heart is slightly abnormal so the lasix we started could help with breathing.

## 2021-08-16 NOTE — Telephone Encounter (Signed)
Reviewed results w patient.  She is not noticing any improvement in her breathing or very much difference in urine output on lasix 10 mg BID. She is also using hctz.  Adv pt I would review with Dr. Ali Lowe and call her back with any new recommendations.  She will continue on current dose of lasix for now.

## 2021-08-16 NOTE — Addendum Note (Signed)
Addended by: Rodman Key on: 08/16/2021 04:43 PM   Modules accepted: Orders

## 2021-08-18 ENCOUNTER — Ambulatory Visit (INDEPENDENT_AMBULATORY_CARE_PROVIDER_SITE_OTHER): Payer: Medicare Other | Admitting: Family Medicine

## 2021-08-18 ENCOUNTER — Encounter: Payer: Self-pay | Admitting: Family Medicine

## 2021-08-18 VITALS — BP 124/70 | HR 72 | Temp 98.5°F | Wt 206.2 lb

## 2021-08-18 DIAGNOSIS — R0609 Other forms of dyspnea: Secondary | ICD-10-CM | POA: Diagnosis not present

## 2021-08-18 DIAGNOSIS — I10 Essential (primary) hypertension: Secondary | ICD-10-CM

## 2021-08-18 DIAGNOSIS — E118 Type 2 diabetes mellitus with unspecified complications: Secondary | ICD-10-CM

## 2021-08-18 MED ORDER — TELMISARTAN 20 MG PO TABS
20.0000 mg | ORAL_TABLET | Freq: Every day | ORAL | 3 refills | Status: DC
Start: 2021-08-18 — End: 2021-10-23

## 2021-08-18 NOTE — Progress Notes (Signed)
Subjective:    Patient ID: Suzanne Conner, female    DOB: [birth year], 75 y.o.   MRN: [id number]  Chief Complaint  Patient presents with   Follow-up    BP    HPI Patient was seen today for follow-up on blood pressure.  Pt feeling a little better.  Seen by cardiology recently.  Started on Lasix 10 mg, Lipitor 40 mg, Jardiance 10 mg.  Patient states she is having less shortness of breath with walking up inclines and has noticed slight decrease in weight.  Pt requesting refill on telmisartan be sent to mail order pharmacy.  Pt concerned about overeating during holidays.  Past Medical History:  Diagnosis Date   Arthritis    "left hip; right wrist" (06/12/2018)   Asthma    Depression    Family history of polyps in the colon    GERD (gastroesophageal reflux disease)    Glaucoma    Hypertension    Type 2 diabetes, diet controlled ([place])     Allergies  Allergen Reactions   Penicillins Hives and Other (See Comments)    PATIENT HAS HAD A PCN REACTION WITH IMMEDIATE RASH, FACIAL/TONGUE/THROAT SWELLING, SOB, OR LIGHTHEADEDNESS WITH HYPOTENSION:  #  #  YES  #  #  INCLUDING HIVES AS A CHILD Has patient had a PCN reaction causing severe rash involving mucus membranes or skin necrosis: No Has patient had a PCN reaction that required hospitalization: No Has patient had a PCN reaction occurring within the last 10 years: No If all of the above answers are "NO", then may proceed with Cephalosporin use. PATIENT HAS HAD A PCN REACTION WITH IMMEDIATE RASH, FACIAL/TONGUE/THROAT SWELLING, SOB, OR LIGHTHEADEDNESS WITH HYPOTENSION:  #  #  YES  #  #  INCLUDING HIVES AS A CHILD Has patient had a PCN reaction causing severe rash involving mucus membranes or skin necrosis: No Has patient had a PCN reaction that required hospitalization: No Has patient had a PCN reaction occurring within the last 10 years: No If all of the above answers are "NO", then may proceed with Cephalosporin use.    Paxil [Paroxetine]      Paxil 20 mg caused a tremor.    ROS General: Denies fever, chills, night sweats, changes in weight, changes in appetite HEENT: Denies headaches, ear pain, changes in vision, rhinorrhea, sore throat CV: Denies CP, palpitations, orthopnea   +DOE Pulm: Denies cough, wheezing  +DOE GI: Denies abdominal pain, nausea, vomiting, diarrhea, constipation GU: Denies dysuria, hematuria, frequency, vaginal discharge Msk: Denies muscle cramps, joint pains Neuro: Denies weakness, numbness, tingling Skin: Denies rashes, bruising Psych: Denies depression, anxiety, hallucinations     Objective:    Blood pressure 124/70, pulse 72, temperature 98.5 F (36.9 C), temperature source Oral, weight 206 lb 3.2 oz (93.5 kg), SpO2 96 %.   Gen. Pleasant, well-nourished, in no distress, normal affect   HEENT: [place]/AT, face symmetric, conjunctiva clear, no scleral icterus, PERRLA, EOMI, nares patent without drainage Lungs: no accessory muscle use, CTAB, no wheezes or rales Cardiovascular: RRR, no m/r/g, no peripheral edema Abdomen: BS present, soft Musculoskeletal: No deformities, no cyanosis or clubbing, normal tone Neuro:  A&Ox3, CN II-XII intact, normal gait Skin:  Warm, no lesions/ rash   Wt Readings from Last 3 Encounters:  08/18/21 206 lb 3.2 oz (93.5 kg)  07/24/21 210 lb 3.2 oz (95.3 kg)  07/19/21 207 lb 12.8 oz (94.3 kg)    Lab Results  Component Value Date   WBC 7.9 05/11/2021  HGB 13.3 05/11/2021   HCT 40.1 05/11/2021   PLT 292.0 05/11/2021   GLUCOSE 102 (H) 08/01/2021   CHOL 127 05/11/2021   TRIG 62.0 05/11/2021   HDL 53.50 05/11/2021   LDLCALC 61 05/11/2021   ALT 18 05/11/2021   AST 19 05/11/2021   NA 139 08/01/2021   K 4.1 08/01/2021   CL 100 08/01/2021   CREATININE 0.89 08/01/2021   BUN 18 08/01/2021   CO2 26 08/01/2021   TSH 1.00 05/11/2021   HGBA1C 5.9 05/11/2021    Assessment/Plan:  Dyspnea on exertion -Improving -Continue Lasix 10 mg twice daily, HCTZ 12.5  mg -Continue Lipitor 40 mg daily and Jardiance 10 mg daily as started by cardiology for prevention as cholesterol and DM have been controlled. -Continue inhalers  Essential hypertension  -controlled -Continue lifestyle modifications -Continue current medications including telmisartan 20 mg daily, Lasix 10 mg twice a day, HCTZ 12.5 mg - Plan: telmisartan (MICARDIS) 20 MG tablet  Controlled type 2 diabetes mellitus with complication, without long-term current use of insulin (HCC) -Hemoglobin A1c 5.9% on 05/11/2021 -Continue Jardiance 10 mg daily as started by cardiology for prevention. -Continue lifestyle modifications  F/u in 3-4 months  Grier Mitts, MD

## 2021-08-21 MED ORDER — FUROSEMIDE 20 MG PO TABS: 20.0000 mg | ORAL_TABLET | Freq: Two times a day (BID) | ORAL | 3 refills | Status: DC

## 2021-08-21 NOTE — Addendum Note (Signed)
Addended by: Carter Kitten D on: 08/21/2021 12:46 PM   Modules accepted: Orders

## 2021-08-21 NOTE — Telephone Encounter (Signed)
Pt's medication furosemide was resent to [facility] with the correct directions. Confirmation received.

## 2021-08-24 DIAGNOSIS — H2512 Age-related nuclear cataract, left eye: Secondary | ICD-10-CM | POA: Diagnosis not present

## 2021-08-24 DIAGNOSIS — H04123 Dry eye syndrome of bilateral lacrimal glands: Secondary | ICD-10-CM | POA: Diagnosis not present

## 2021-08-24 DIAGNOSIS — H18513 Endothelial corneal dystrophy, bilateral: Secondary | ICD-10-CM | POA: Diagnosis not present

## 2021-08-24 DIAGNOSIS — H401132 Primary open-angle glaucoma, bilateral, moderate stage: Secondary | ICD-10-CM | POA: Diagnosis not present

## 2021-09-20 ENCOUNTER — Other Ambulatory Visit: Payer: Self-pay | Admitting: Family Medicine

## 2021-09-20 DIAGNOSIS — K219 Gastro-esophageal reflux disease without esophagitis: Secondary | ICD-10-CM

## 2021-10-21 NOTE — Progress Notes (Signed)
Cardiology Office Note:    Date:  10/21/2021   ID:  Suzanne Conner, DOB 02-18-1946, MRN [id number]  PCP:  Billie Ruddy, MD   [facility] Providers Cardiologist:  Lenna Sciara, MD Referring MD: Billie Ruddy, MD   Chief Complaint/Reason for Referral: Follow-up  ASSESSMENT:    Dyspnea, unspecified type  Type 2 diabetes mellitus with complication, without long-term current use of insulin ([place])  Hyperlipidemia, unspecified hyperlipidemia type  Hypertension, unspecified type    PLAN:    In order of problems listed above:  1.  Dyspnea  2.  Continue aspirin, atorvastatin, telmisartan, and Jardiance.  3.  We will check lipid panel and LFTs today.  4.  Hypertension             Dispo:  No follow-ups on file.     Medication Adjustments/Labs and Tests Ordered: Current medicines are reviewed at length with the patient today.  Concerns regarding medicines are outlined above.   Tests Ordered: No orders of the defined types were placed in this encounter.   Medication Changes: No orders of the defined types were placed in this encounter.   History of Present Illness:    FOCUSED CARDIOVASCULAR PROBLEM LIST:   1.  Type 2 diabetes 2.  Hypertension 3.  Hyperlipidemia 4.  Diastolic dysfunction   The patient is a 76 y.o. female with the indicated medical history here for follow-up.  I first saw her in November 2022.  She was complaining of dyspnea.  We started empiric Lasix which helped her breathing.  She was started on Jardiance and atorvastatin as well.        Previous Medical History: Past Medical History:  Diagnosis Date   Arthritis    "left hip; right wrist" (06/12/2018)   Asthma    Depression    Family history of polyps in the colon    GERD (gastroesophageal reflux disease)    Glaucoma    Hypertension    Type 2 diabetes, diet controlled ([place])      Current Medications: No outpatient medications have been marked as taking for the  10/23/21 encounter (Appointment) with Early Osmond, MD.     Allergies:    Penicillins and Paxil [paroxetine]   Social History:   Social History   Tobacco Use   Smoking status: Never   Smokeless tobacco: Never  Vaping Use   Vaping Use: Never used  Substance Use Topics   Alcohol use: Yes    Alcohol/week: 5.0 standard drinks    Types: 5 Glasses of wine per week   Drug use: Never    Comment: 06/12/2018 CBD oil for pain     Family Hx: Family History  Problem Relation Age of Onset   Asthma Brother    Diabetes Brother    Dementia Mother    Cancer Father    Heart disease Sister    Diabetes Brother    Diabetes Brother    Healthy Son    Healthy Daughter      Review of Systems:   Please see the history of present illness.    All other systems reviewed and are negative.     EKGs/Labs/Other Test Reviewed:    EKG: None performed  Prior CV studies:  TTE 12/22 1. Left ventricular ejection fraction, by estimation, is 60 to 65%. The  left ventricle has normal function. The left ventricle has no regional  wall motion abnormalities. Left ventricular diastolic parameters are  consistent with Grade I diastolic  dysfunction (  impaired relaxation).   2. Right ventricular systolic function is normal. The right ventricular  size is normal. Tricuspid regurgitation signal is inadequate for assessing  PA pressure.   3. The mitral valve is normal in structure. Trivial mitral valve  regurgitation. No evidence of mitral stenosis.   4. The aortic valve is tricuspid. There is mild calcification of the  aortic valve. There is mild thickening of the aortic valve. Aortic valve  regurgitation is trivial. Aortic valve sclerosis/calcification is present,  without any evidence of aortic  stenosis.   Imaging studies that I have independently reviewed today: Echocardiogram  Recent Labs: 05/11/2021: ALT 18; Hemoglobin 13.3; Platelets 292.0; TSH 1.00 08/01/2021: BUN 18; Creatinine, Ser 0.89;  Potassium 4.1; Sodium 139   Recent Lipid Panel Lab Results  Component Value Date/Time   CHOL 127 05/11/2021 10:19 AM   TRIG 62.0 05/11/2021 10:19 AM   HDL 53.50 05/11/2021 10:19 AM   LDLCALC 61 05/11/2021 10:19 AM   LDLCALC 76 03/18/2020 11:35 AM    Risk Assessment/Calculations:          Physical Exam:    VS:  There were no vitals taken for this visit.   Wt Readings from Last 3 Encounters:  08/18/21 206 lb 3.2 oz (93.5 kg)  07/24/21 210 lb 3.2 oz (95.3 kg)  07/19/21 207 lb 12.8 oz (94.3 kg)    GENERAL:  No apparent distress, AOx3 HEENT:  No carotid bruits, +2 carotid impulses, no scleral icterus CAR: RRR no murmurs, gallops, rubs, or thrills RES:  Clear to auscultation bilaterally ABD:  Soft, nontender, nondistended, positive bowel sounds x 4 VASC:  +2 radial pulses, +2 carotid pulses, palpable pedal pulses NEURO:  CN 2-12 grossly intact; motor and sensory grossly intact PSYCH:  No active depression or anxiety EXT:  No edema, ecchymosis, or cyanosis  Signed, Early Osmond, MD  10/21/2021 7:39 AM    [address], [place], [place]  [address] Phone: [phone]; Fax: [phone]   Note:  This document was prepared using Dragon voice recognition software and may include unintentional dictation errors.

## 2021-10-23 ENCOUNTER — Encounter: Payer: Self-pay | Admitting: Internal Medicine

## 2021-10-23 ENCOUNTER — Ambulatory Visit (INDEPENDENT_AMBULATORY_CARE_PROVIDER_SITE_OTHER): Payer: Medicare Other | Admitting: Internal Medicine

## 2021-10-23 ENCOUNTER — Other Ambulatory Visit: Payer: Self-pay

## 2021-10-23 VITALS — BP 140/70 | HR 89 | Ht 64.0 in | Wt 204.0 lb

## 2021-10-23 DIAGNOSIS — E785 Hyperlipidemia, unspecified: Secondary | ICD-10-CM | POA: Diagnosis not present

## 2021-10-23 DIAGNOSIS — R0600 Dyspnea, unspecified: Secondary | ICD-10-CM | POA: Diagnosis not present

## 2021-10-23 DIAGNOSIS — E118 Type 2 diabetes mellitus with unspecified complications: Secondary | ICD-10-CM

## 2021-10-23 DIAGNOSIS — Z6835 Body mass index (BMI) 35.0-35.9, adult: Secondary | ICD-10-CM

## 2021-10-23 DIAGNOSIS — I10 Essential (primary) hypertension: Secondary | ICD-10-CM | POA: Diagnosis not present

## 2021-10-23 MED ORDER — SIMVASTATIN 20 MG PO TABS: 20.0000 mg | ORAL_TABLET | Freq: Every day | ORAL | 3 refills | Status: DC

## 2021-10-23 MED ORDER — TELMISARTAN 40 MG PO TABS: 40.0000 mg | ORAL_TABLET | Freq: Every day | ORAL | 3 refills | Status: DC

## 2021-10-23 NOTE — Progress Notes (Signed)
Cardiology Office Note:    Date:  10/23/2021   ID:  Suzanne Conner, DOB 1946-07-08, MRN [id number]  PCP:  Billie Ruddy, MD   [facility] Providers Cardiologist:  Lenna Sciara, MD Referring MD: Billie Ruddy, MD   Chief Complaint/Reason for Referral: Follow-up  ASSESSMENT:    Dyspnea, unspecified type  Type 2 diabetes mellitus with complication, without long-term current use of insulin ([place])  Hyperlipidemia, unspecified hyperlipidemia type  Hypertension, unspecified type  BMI 35.0-35.9,adult    PLAN:    In order of problems listed above:  1.  Dyspnea  2.  Continue aspirin, simvastatin 20, telmisartan, and Jardiance.  3.  She is having arthralgias with atorvastatin, will start simvastatin 40mg .  4.  Stop hctz and increase micardis to 40mg .  Check BMP next week.  5.  Will refer to pharmacy for obesity management recommendations.           Dispo:  No follow-ups on file.     Medication Adjustments/Labs and Tests Ordered: Current medicines are reviewed at length with the patient today.  Concerns regarding medicines are outlined above.   Tests Ordered: No orders of the defined types were placed in this encounter.   Medication Changes: No orders of the defined types were placed in this encounter.   History of Present Illness:    FOCUSED CARDIOVASCULAR PROBLEM LIST:   1.  Type 2 diabetes 2.  Hypertension 3.  Hyperlipidemia 4.  Diastolic dysfunction   The patient is a 76 y.o. female with the indicated medical history here for follow-up.  I first saw her in November 2022.  She was complaining of dyspnea.  We started empiric Lasix which helped her breathing.  She was started on Jardiance and atorvastatin as well.  Her breathing is mildly improved with Lasix.  She still feels short of breath with exertion at times.  She denies any chest pain, palpitations, paroxysmal nocturnal dyspnea, orthopnea.  She has required no emergency room visits or  hospitalizations.  In terms of her weight she is a stress eater and tells me that her weight is gone up and down.  When reviewing the record she is gained about 10 pounds in the last few years.          Previous Medical History: Past Medical History:  Diagnosis Date   Arthritis    "left hip; right wrist" (06/12/2018)   Asthma    Depression    Family history of polyps in the colon    GERD (gastroesophageal reflux disease)    Glaucoma    Hypertension    Type 2 diabetes, diet controlled ([facility])      Current Medications: Current Meds  Medication Sig   albuterol (VENTOLIN HFA) 108 (90 Base) MCG/ACT inhaler Inhale 2 puffs into the lungs every 6 (six) hours as needed for wheezing or shortness of breath.   aspirin EC 81 MG tablet Take 81 mg by mouth daily.   atorvastatin (LIPITOR) 40 MG tablet Take 1 tablet (40 mg total) by mouth daily.   Cholecalciferol (VITAMIN D) 2000 units CAPS Take 2,000 Units by mouth daily.   clindamycin (CLEOCIN) 300 MG capsule Take 300 mg by mouth 2 (two) times daily. Prior to dental appointments   Clobetasol Propionate 0.05 % shampoo Use a small amount on the affected areas twice a day as needed.   empagliflozin (JARDIANCE) 10 MG TABS tablet Take 1 tablet (10 mg total) by mouth daily before breakfast.   fluticasone-salmeterol (ADVAIR HFA) 230-21 MCG/ACT  inhaler Inhale 2 puffs into the lungs 2 (two) times daily.   furosemide (LASIX) 20 MG tablet Take 1 tablet (20 mg total) by mouth 2 (two) times daily.   hydrochlorothiazide (HYDRODIURIL) 12.5 MG tablet TAKE 1 TABLET DAILY   hydrocortisone valerate cream (WESTCORT) 0.2 % Apply 1 application topically 2 (two) times daily.   latanoprost (XALATAN) 0.005 % ophthalmic solution Place 1 drop into both eyes at bedtime.   Melatonin 10 MG TABS Take 1 tablet by mouth at bedtime as needed.   Multiple Vitamins-Minerals (CENTRUM SILVER PO) Take 1 tablet by mouth daily.    omeprazole (PRILOSEC) 40 MG capsule  TAKE 1 CAPSULE TWICE A DAY   OPTIVE 0.5-0.9 % ophthalmic solution Place 1 drop into both eyes 2 (two) times daily.   Probiotic Product (TRUNATURE DIGESTIVE PROBIOTIC) CAPS Take 1 capsule by mouth daily.    RESTASIS 0.05 % ophthalmic emulsion Place 1 drop into both eyes in the morning and at bedtime.   telmisartan (MICARDIS) 20 MG tablet Take 1 tablet (20 mg total) by mouth daily.     Allergies:    Paxil [paroxetine] and Penicillins   Social History:   Social History   Tobacco Use   Smoking status: Never   Smokeless tobacco: Never  Vaping Use   Vaping Use: Never used  Substance Use Topics   Alcohol use: Yes    Alcohol/week: 5.0 standard drinks    Types: 5 Glasses of wine per week   Drug use: Never    Comment: 06/12/2018 CBD oil for pain     Family Hx: Family History  Problem Relation Age of Onset   Asthma Brother    Diabetes Brother    Dementia Mother    Cancer Father    Heart disease Sister    Diabetes Brother    Diabetes Brother    Healthy Son    Healthy Daughter      Review of Systems:   Please see the history of present illness.    All other systems reviewed and are negative.     EKGs/Labs/Other Test Reviewed:    EKG: Not performed today  Prior CV studies:  TTE 12/22 1. Left ventricular ejection fraction, by estimation, is 60 to 65%. The  left ventricle has normal function. The left ventricle has no regional  wall motion abnormalities. Left ventricular diastolic parameters are  consistent with Grade I diastolic  dysfunction (impaired relaxation).   2. Right ventricular systolic function is normal. The right ventricular  size is normal. Tricuspid regurgitation signal is inadequate for assessing  PA pressure.   3. The mitral valve is normal in structure. Trivial mitral valve  regurgitation. No evidence of mitral stenosis.   4. The aortic valve is tricuspid. There is mild calcification of the  aortic valve. There is mild thickening of  the aortic valve. Aortic valve  regurgitation is trivial. Aortic valve sclerosis/calcification is present,  without any evidence of aortic  stenosis.   Imaging studies that I have independently reviewed today: Echocardiogram  Recent Labs: 05/11/2021: ALT 18; Hemoglobin 13.3; Platelets 292.0; TSH 1.00 08/01/2021: BUN 18; Creatinine, Ser 0.89; Potassium 4.1; Sodium 139   Recent Lipid Panel Lab Results  Component Value Date/Time   CHOL 127 05/11/2021 10:19 AM   TRIG 62.0 05/11/2021 10:19 AM   HDL 53.50 05/11/2021 10:19 AM   LDLCALC 61 05/11/2021 10:19 AM   LDLCALC 76 03/18/2020 11:35 AM    Risk Assessment/Calculations:  Physical Exam:    VS:  BP 140/70    Pulse 89    Ht 5\' 4"  (1.626 m)    Wt 204 lb (92.5 kg)    SpO2 99%    BMI 35.02 kg/m    Wt Readings from Last 3 Encounters:  10/23/21 204 lb (92.5 kg)  08/18/21 206 lb 3.2 oz (93.5 kg)  07/24/21 210 lb 3.2 oz (95.3 kg)    GENERAL:  No apparent distress, AOx3 HEENT:  No carotid bruits, +2 carotid impulses, no scleral icterus CAR: RRR no murmurs, gallops, rubs, or thrills RES:  Clear to auscultation bilaterally ABD:  Soft, nontender, nondistended, positive bowel sounds x 4 VASC:  +2 radial pulses, +2 carotid pulses, palpable pedal pulses NEURO:  CN 2-12 grossly intact; motor and sensory grossly intact PSYCH:  No active depression or anxiety EXT:  No edema, ecchymosis, or cyanosis  Signed, Early Osmond, MD  10/23/2021 10:18 AM    [address], [place], [place]  [address] Phone: [phone]; Fax: [phone]   Note:  This document was prepared using Dragon voice recognition software and may include unintentional dictation errors.

## 2021-10-23 NOTE — Patient Instructions (Addendum)
Medication Instructions:  Your physician has recommended you make the following change in your medication: Stop Atorvastatin. Start Simvastatin 20 mg by mouth daily. Stop hydrochlorothiazide. Increase Micardis (Telmisartan)  to 40 mg by mouth daily  *If you need a refill on your cardiac medications before your next appointment, please call your pharmacy*   Lab Work: Your physician recommends that you return for lab work in: one week.  BMP.  This is not fasting  If you have labs (blood work) drawn today and your tests are completely normal, you will receive your results only by: [place] (if you have MyChart) OR A paper copy in the mail If you have any lab test that is abnormal or we need to change your treatment, we will call you to review the results.   Testing/Procedures: none   Follow-Up: At [facility], you and your health needs are our priority.  As part of our continuing mission to provide you with exceptional heart care, we have created designated Provider Care Teams.  These Care Teams include your primary Cardiologist (physician) and Advanced Practice Providers (APPs -  Physician Assistants and Nurse Practitioners) who all work together to provide you with the care you need, when you need it.  We recommend signing up for the patient portal called "MyChart".  Sign up information is provided on this After Visit Summary.  MyChart is used to connect with patients for Virtual Visits (Telemedicine).  Patients are able to view lab/test results, encounter notes, upcoming appointments, etc.  Non-urgent messages can be sent to your provider as well.   To learn more about what you can do with MyChart, go to [email or web address].    Your next appointment:   6 month(s)  The format for your next appointment:   In Person  Provider:   Early Osmond, MD or APP   Other Instructions You have been referred to pharmacist for weight management.  Please schedule new patient  appointment

## 2021-10-31 ENCOUNTER — Other Ambulatory Visit: Payer: Self-pay

## 2021-10-31 ENCOUNTER — Other Ambulatory Visit: Payer: Medicare Other | Admitting: *Deleted

## 2021-10-31 DIAGNOSIS — I10 Essential (primary) hypertension: Secondary | ICD-10-CM | POA: Diagnosis not present

## 2021-10-31 LAB — BASIC METABOLIC PANEL
BUN/Creatinine Ratio: 17 (ref 12–28)
BUN: 13 mg/dL (ref 8–27)
CO2: 27 mmol/L (ref 20–29)
Calcium: 9.2 mg/dL (ref 8.7–10.3)
Chloride: 104 mmol/L (ref 96–106)
Creatinine, Ser: 0.77 mg/dL (ref 0.57–1.00)
Glucose: 94 mg/dL (ref 70–99)
Potassium: 4.6 mmol/L (ref 3.5–5.2)
Sodium: 144 mmol/L (ref 134–144)
eGFR: 81 mL/min/{1.73_m2} (ref >59)

## 2021-11-15 ENCOUNTER — Ambulatory Visit (INDEPENDENT_AMBULATORY_CARE_PROVIDER_SITE_OTHER): Payer: Medicare Other | Admitting: Pharmacist

## 2021-11-15 ENCOUNTER — Other Ambulatory Visit: Payer: Self-pay

## 2021-11-15 VITALS — BP 140/72 | HR 89 | Wt 205.0 lb

## 2021-11-15 DIAGNOSIS — I10 Essential (primary) hypertension: Secondary | ICD-10-CM

## 2021-11-15 DIAGNOSIS — E782 Mixed hyperlipidemia: Secondary | ICD-10-CM | POA: Diagnosis not present

## 2021-11-15 DIAGNOSIS — E669 Obesity, unspecified: Secondary | ICD-10-CM | POA: Diagnosis not present

## 2021-11-15 DIAGNOSIS — E785 Hyperlipidemia, unspecified: Secondary | ICD-10-CM | POA: Insufficient documentation

## 2021-11-15 MED ORDER — TELMISARTAN 40 MG PO TABS: 40.0000 mg | ORAL_TABLET | Freq: Every day | ORAL | 3 refills | Status: DC

## 2021-11-15 MED ORDER — EMPAGLIFLOZIN 10 MG PO TABS: 10.0000 mg | ORAL_TABLET | Freq: Every day | ORAL | 3 refills | Status: DC

## 2021-11-15 MED ORDER — AMLODIPINE BESYLATE 5 MG PO TABS: 5.0000 mg | ORAL_TABLET | Freq: Every day | ORAL | 3 refills | Status: DC

## 2021-11-15 MED ORDER — ROSUVASTATIN CALCIUM 5 MG PO TABS: 5.0000 mg | ORAL_TABLET | Freq: Every day | ORAL | 3 refills | Status: DC

## 2021-11-15 NOTE — Progress Notes (Signed)
Patient ID: Suzanne Conner                 DOB: 10-29-1945                      MRN: [id number] ? ? ? ? ?HPI: ?Suzanne Conner is a 76 y.o. female referred by Dr. Ali Lowe to pharmacy clinic. PMH is significant for dyspnea, T2DM, HTN, HLD, obesity, GERD, asthma, arthritis, and depression. TTE 08/2021 showed EF 60-65% with G1DD. Last seen by Dr Ali Lowe 10/23/21 where pt was changed from atorvastatin to simvastatin secondary to myalgias, HCTZ was stopped and telmisartan was increased to '40mg'$  daily. F/u BMET stable. She was referred to PharmD for weight management. ? ?Pt presents today for follow up. Reports joint pain has improved since changing from atorvastatin to simvastatin but she is still experiencing it. Primarily in her hips and extremities. BP has also been running high since stopping her HCTZ and increasing her telmisartan dose. Running 150-155/80s at home. Denies headache and LE edema. ? ?Reports stress eating and likes carbs, feels like she is always hungry. Has not tried medications for weight loss in the past. A1c very well controlled at 5.9% and that was before she was started on Jardiance. Jardiance refill has been sent to [facility] for cost savings with her [employer]. ? ?Family History: Brother with asthma and DM, mother with dementia, father with cancer, sister with heart disease. ? ?Social History: Denies tobacco use. ? ?Diet: Has cravings for carbs. Breakfast - eggs, toast, protein. Likes snacking - nuts, fruit, cheese and crackers. Lunch - sandwich or soup. Dinner - beef, chicken or fish, vegetable and starch. Drinks - water, diet coke, unsweet tea ? ?Exercise: SOB with exertion limits activity ? ?Wt Readings from Last 3 Encounters:  ?10/23/21 204 lb (92.5 kg)  ?08/18/21 206 lb 3.2 oz (93.5 kg)  ?07/24/21 210 lb 3.2 oz (95.3 kg)  ? ?BP Readings from Last 3 Encounters:  ?10/23/21 140/70  ?08/18/21 124/70  ?07/24/21 (!) 148/70  ? ?Pulse Readings from Last 3 Encounters:  ?10/23/21 89   ?08/18/21 72  ?07/24/21 84  ? ? ?Renal function: ?CrCl cannot be calculated (Unknown ideal weight.). ? ?Past Medical History:  ?Diagnosis Date  ? Arthritis   ? "left hip; right wrist" (06/12/2018)  ? Asthma   ? Depression   ? Family history of polyps in the colon   ? GERD (gastroesophageal reflux disease)   ? Glaucoma   ? Hypertension   ? Type 2 diabetes, diet controlled ([place])   ? ? ?Current Outpatient Medications on File Prior to Visit  ?Medication Sig Dispense Refill  ? albuterol (VENTOLIN HFA) 108 (90 Base) MCG/ACT inhaler Inhale 2 puffs into the lungs every 6 (six) hours as needed for wheezing or shortness of breath. 3 each 2  ? aspirin EC 81 MG tablet Take 81 mg by mouth daily.    ? Cholecalciferol (VITAMIN D) 2000 units CAPS Take 2,000 Units by mouth daily.    ? clindamycin (CLEOCIN) 300 MG capsule Take 300 mg by mouth 2 (two) times daily. Prior to dental appointments    ? Clobetasol Propionate 0.05 % shampoo Use a small amount on the affected areas twice a day as needed. 118 mL 0  ? empagliflozin (JARDIANCE) 10 MG TABS tablet Take 1 tablet (10 mg total) by mouth daily before breakfast. 30 tablet 5  ? fluticasone-salmeterol (ADVAIR HFA) 230-21 MCG/ACT inhaler Inhale 2 puffs into the lungs 2 (  two) times daily. 3 each 3  ? furosemide (LASIX) 20 MG tablet Take 1 tablet (20 mg total) by mouth 2 (two) times daily. 180 tablet 3  ? hydrocortisone valerate cream (WESTCORT) 0.2 % Apply 1 application topically 2 (two) times daily. 45 g 0  ? latanoprost (XALATAN) 0.005 % ophthalmic solution Place 1 drop into both eyes at bedtime.    ? Melatonin 10 MG TABS Take 1 tablet by mouth at bedtime as needed.    ? Multiple Vitamins-Minerals (CENTRUM SILVER PO) Take 1 tablet by mouth daily.     ? omeprazole (PRILOSEC) 40 MG capsule TAKE 1 CAPSULE TWICE A DAY 180 capsule 3  ? OPTIVE 0.5-0.9 % ophthalmic solution Place 1 drop into both eyes 2 (two) times daily.    ? Probiotic Product (TRUNATURE DIGESTIVE PROBIOTIC) CAPS Take 1  capsule by mouth daily.     ? RESTASIS 0.05 % ophthalmic emulsion Place 1 drop into both eyes in the morning and at bedtime.    ? simvastatin (ZOCOR) 20 MG tablet Take 1 tablet (20 mg total) by mouth at bedtime. 90 tablet 3  ? telmisartan (MICARDIS) 40 MG tablet Take 1 tablet (40 mg total) by mouth daily. 90 tablet 3  ? ?No current facility-administered medications on file prior to visit.  ? ? ?Allergies  ?Allergen Reactions  ? Paxil [Paroxetine]   ?  Paxil 20 mg caused a tremor.  ? Penicillins Hives and Other (See Comments)  ?  PATIENT HAS HAD A PCN REACTION WITH IMMEDIATE RASH, FACIAL/TONGUE/THROAT SWELLING, SOB, OR LIGHTHEADEDNESS WITH HYPOTENSION:  #  #  YES  #  #  INCLUDING HIVES AS A CHILD ?Has patient had a PCN reaction causing severe rash involving mucus membranes or skin necrosis: No ?Has patient had a PCN reaction that required hospitalization: No ?Has patient had a PCN reaction occurring within the last 10 years: No ?If all of the above answers are "NO", then may proceed with Cephalosporin use. ?PATIENT HAS HAD A PCN REACTION WITH IMMEDIATE RASH, FACIAL/TONGUE/THROAT SWELLING, SOB, OR LIGHTHEADEDNESS WITH HYPOTENSION:  #  #  YES  #  #  INCLUDING HIVES AS A CHILD ?Has patient had a PCN reaction causing severe rash involving mucus membranes or skin necrosis: No ?Has patient had a PCN reaction that required hospitalization: No ?Has patient had a PCN reaction occurring within the last 10 years: No ?If all of the above answers are "NO", then may proceed with Cephalosporin use. ?  ? ? ? ?Assessment/Plan: ? ?1. Hypertension - BP elevated above goal <130/22mHg since HCTZ was stopped at her last visit and telmisartan was increased. Will start amlodipine '5mg'$  daily and continue telmisartan '40mg'$  daily, also on furosemide '20mg'$  BID for her SOB. Pt will continue to monitor BP at home and call if readings remain above goal after she's been on amlodipine for a few weeks, can increase dose to '10mg'$  if needed. Aware to  monitor for any LE edema, none currently. ? ?2. Hyperlipidemia - Still experiencing joint pain since changing from atorvastatin to simvastatin. Hydrophilic statin would have higher chance of being tolerated better. Will stop simvastatin, pt aware to monitor for resolution of joint pain in the next 1-2 weeks. Then, will start rosuvastatin '5mg'$  daily. She'll call with any tolerability issues and can decrease frequency if needed. ? ?3. Weight loss - Discussed diet and exercise today, pt has had trouble losing weight. Feels like she is hungry all the time, exercise is limited by DOE. Discussed trying  Mancel Parsons which pt is very interested in. Coverage has been hit or miss as [employer] requires pt to have either tried or have a contraindication to generic phentermine, Qsymia, Xenical, and Contrave, all of which are less effective/have worse side effects than Wegovy. Ozempic is nonformulary (A1c also very well controlled at 5.9%). Will call pt once determination is made. ? ?Tyrees Chopin E. Rayette Mogg, PharmD, BCACP, CPP ?[facility], [place], [place] [address] ?Phone: [phone]; Fax: [phone] ?11/15/2021 10:22 AM ? ? ?

## 2021-11-15 NOTE — Patient Instructions (Addendum)
Blood pressure: ?Start amlodipine '5mg'$  - 1 tablet once daily for your blood pressure. Continue taking your higher dose of telmisartan '40mg'$  daily. ? ?Monitor your blood pressure at home - your goal is < 130/8mHg ? ?Cholesterol: ?Stop taking simvastatin. Monitor to see if your joint pain resolves. ?After 1-2 weeks if your pain is improved, start rosuvastatin (Crestor) '10mg'$  - 1 tablet once daily. Let [place]now if this causes joint pain as well and we can decrease the dose or frequency ? ?Weight loss: ?I'll submit a request to your insurance to see if they will cover Wegovy injections and will let you know when I hear back from them. ?

## 2021-11-16 ENCOUNTER — Telehealth: Payer: Self-pay | Admitting: Pharmacist

## 2021-11-16 ENCOUNTER — Encounter: Payer: Self-pay | Admitting: Family Medicine

## 2021-11-16 ENCOUNTER — Ambulatory Visit (INDEPENDENT_AMBULATORY_CARE_PROVIDER_SITE_OTHER): Payer: Medicare Other | Admitting: Family Medicine

## 2021-11-16 ENCOUNTER — Encounter: Payer: Self-pay | Admitting: Pharmacist

## 2021-11-16 VITALS — BP 143/60 | HR 77 | Temp 98.4°F | Resp 97 | Wt 204.2 lb

## 2021-11-16 DIAGNOSIS — I10 Essential (primary) hypertension: Secondary | ICD-10-CM | POA: Diagnosis not present

## 2021-11-16 DIAGNOSIS — E118 Type 2 diabetes mellitus with unspecified complications: Secondary | ICD-10-CM

## 2021-11-16 LAB — POCT GLYCOSYLATED HEMOGLOBIN (HGB A1C): Hemoglobin A1C: 5.8 % — AB (ref 4.0–5.6)

## 2021-11-16 NOTE — Telephone Encounter (Addendum)
[employer] denied Wegovy prior authorization, they request that she either try or have a contraindication to generic phentermine, Qsymia, Xenical, and Contrave, all of which are less effective/have worse side effect profiles than Wegovy. Will try submitting an appeals as pt reports really wishing to try [facility] if possible. She will need to sign a form from [employer] stating that the office can act on her behalf. I have sent this to her via MyChart message to sign. Pt is aware. ?

## 2021-11-16 NOTE — Progress Notes (Signed)
Subjective:  ? ? Patient ID: Suzanne Conner, female    DOB: July 30, 1946, 76 y.o.   MRN: [id number] ? ?Chief Complaint  ?Patient presents with  ? Follow-up  ?  BP, DM  ? ? ?HPI ?Patient was seen today for f/ u on chronic conditions.  Patient has yet to take her blood pressure medicine this morning.  Seen by Cardiology.  Had appt yesterday with Cards pharmacist, several med adjustments made.  Since last OFV, pt started on Norvasc 5 mg,  HCTZ d/c, and telmisartan increased to 40 mg.  Pt still on lasix 20 mg daily.  Pt had statin induced myalgias/jt pain on Lipitor and Zocor.  Advised to start low dose Crestor this wk and consider CoQ10 for continued myalgias.     ? ?Pt taking jardiance without issue.  Last Hgb A1C was 5.9% on 05/11/21. ? ?Pt states trying to get approval for wt loss medication from insurance company.  Awaiting phone call from pharmacist.  Trying not to eat past 8 pm.  Trying to exercise and decrease portions but still not losing weight. ? ?Past Medical History:  ?Diagnosis Date  ? Arthritis   ? "left hip; right wrist" (06/12/2018)  ? Asthma   ? Depression   ? Family history of polyps in the colon   ? GERD (gastroesophageal reflux disease)   ? Glaucoma   ? Hypertension   ? Type 2 diabetes, diet controlled ([place])   ? ? ?Allergies  ?Allergen Reactions  ? Paxil [Paroxetine]   ?  Paxil 20 mg caused a tremor.  ? Penicillins Hives and Other (See Comments)  ?  PATIENT HAS HAD A PCN REACTION WITH IMMEDIATE RASH, FACIAL/TONGUE/THROAT SWELLING, SOB, OR LIGHTHEADEDNESS WITH HYPOTENSION:  #  #  YES  #  #  INCLUDING HIVES AS A CHILD ?Has patient had a PCN reaction causing severe rash involving mucus membranes or skin necrosis: No ?Has patient had a PCN reaction that required hospitalization: No ?Has patient had a PCN reaction occurring within the last 10 years: No ?If all of the above answers are "NO", then may proceed with Cephalosporin use. ?PATIENT HAS HAD A PCN REACTION WITH IMMEDIATE RASH, FACIAL/TONGUE/THROAT  SWELLING, SOB, OR LIGHTHEADEDNESS WITH HYPOTENSION:  #  #  YES  #  #  INCLUDING HIVES AS A CHILD ?Has patient had a PCN reaction causing severe rash involving mucus membranes or skin necrosis: No ?Has patient had a PCN reaction that required hospitalization: No ?Has patient had a PCN reaction occurring within the last 10 years: No ?If all of the above answers are "NO", then may proceed with Cephalosporin use. ?  ? Atorvastatin   ?  Joint pain on '40mg'$   ? Simvastatin   ?  Joint pain on '20mg'$  daily  ? ? ?ROS ?General: Denies fever, chills, night sweats, changes in appetite +weight gain ?HEENT: Denies headaches, ear pain, changes in vision, rhinorrhea, sore throat ?CV: Denies CP, palpitations, SOB, orthopnea ?Pulm: Denies SOB, cough, wheezing ?GI: Denies abdominal pain, nausea, vomiting, diarrhea, constipation ?GU: Denies dysuria, hematuria, frequency, vaginal discharge ?Msk: Denies muscle cramps, joint pains ?Neuro: Denies weakness, numbness, tingling ?Skin: Denies rashes, bruising ?Psych: Denies depression, anxiety, hallucinations ?   ?Objective:  ?  ?Blood pressure (!) 143/60, pulse 77, temperature 98.4 ?F (36.9 ?C), temperature source Oral, resp. rate (!) 97, weight 204 lb 3.2 oz (92.6 kg). ? ?Gen. Pleasant, well-nourished, in no distress, normal affect   ?HEENT: [place]/AT, face symmetric, conjunctiva clear, no scleral icterus, PERRLA,  EOMI, nares patent without drainage ?Lungs: no accessory muscle use, CTAB, no wheezes or rales ?Cardiovascular: RRR, no m/r/g, no peripheral edema ?Musculoskeletal: No deformities, no cyanosis or clubbing, normal tone ?Neuro:  A&Ox3, CN II-XII intact, normal gait ?Skin:  Warm, no lesions/ rash ? ?Wt Readings from Last 3 Encounters:  ?11/15/21 205 lb (93 kg)  ?10/23/21 204 lb (92.5 kg)  ?08/18/21 206 lb 3.2 oz (93.5 kg)  ? ? ?Lab Results  ?Component Value Date  ? WBC 7.9 05/11/2021  ? HGB 13.3 05/11/2021  ? HCT 40.1 05/11/2021  ? PLT 292.0 05/11/2021  ? GLUCOSE 94 10/31/2021  ? CHOL 127  05/11/2021  ? TRIG 62.0 05/11/2021  ? HDL 53.50 05/11/2021  ? [place] 61 05/11/2021  ? ALT 18 05/11/2021  ? AST 19 05/11/2021  ? NA 144 10/31/2021  ? K 4.6 10/31/2021  ? CL 104 10/31/2021  ? CREATININE 0.77 10/31/2021  ? BUN 13 10/31/2021  ? CO2 27 10/31/2021  ? TSH 1.00 05/11/2021  ? HGBA1C 5.9 05/11/2021  ? ? ?Assessment/Plan: ? ?Essential hypertension ?-elevated, but has yet to take meds this am ?-continue current meds as recently adjusted by Cardiology ?-telmisartan 40 mg daily, norvasc 5 mg daily, lasix 20 mg BID ?-lifestyle modifications ? ?Controlled type 2 diabetes mellitus with complication, without long-term current use of insulin ([place])  ?-hgb A1C 5.9% on 05/11/21 ?-hgb A1C 5.8% this visit ?-continue lifestyle modifications ?-Myalgias with atorvastatin and simvastatin.  To start rosuvastatin this wk. Consider taking a few times per wk or with CoQ10 for continued myalgias. ?-Eye exam done 02/13/21 ?-foot exam at next OFV. ?-Plan: POCT glycosylated hemoglobin (Hb A1C) ? ?Patient to follow-up with cardiology pharmacist regarding weight loss medication approval.  If needed follow-up with this provider regarding wt management if unable to get med. ? ?F/u in 2-3 months ? ?Grier Mitts, MD ?

## 2021-11-17 NOTE — Telephone Encounter (Signed)
Pt signature dropped off, appeals has been faxed to [employer]. ?

## 2021-12-06 ENCOUNTER — Ambulatory Visit (INDEPENDENT_AMBULATORY_CARE_PROVIDER_SITE_OTHER): Payer: Medicare Other | Admitting: Family Medicine

## 2021-12-06 ENCOUNTER — Encounter: Payer: Self-pay | Admitting: Family Medicine

## 2021-12-06 VITALS — BP 132/67 | HR 80 | Temp 98.3°F | Wt 204.2 lb

## 2021-12-06 DIAGNOSIS — I10 Essential (primary) hypertension: Secondary | ICD-10-CM

## 2021-12-06 DIAGNOSIS — Z6835 Body mass index (BMI) 35.0-35.9, adult: Secondary | ICD-10-CM

## 2021-12-06 DIAGNOSIS — R002 Palpitations: Secondary | ICD-10-CM

## 2021-12-06 NOTE — Progress Notes (Signed)
Subjective:  ? ? Patient ID: Suzanne Conner, female    DOB: 04-Oct-1945, 76 y.o.   MRN: [id number] ? ?Chief Complaint  ?Patient presents with  ? Follow-up  ?  Got a call from a nurse asking to come in and talk about BP being elevated.  ?Wants to talk about the norvasc, BP  is still elevated   ? ? ?HPI ?Patient was seen today for f/u on HTN.  Patient taking telmisartan 40 mg daily, Norvasc 5 mg daily, Lasix 20 mg twice daily.  Seen 11/16/2021 for BP 143/60, at time of visit pt had not taken medication.  Pt initially confused about appointment.  States she was contacted by someone on Monday stating she needed to make an appointment.  This may pt still anxious as she was just seen on 3/9.  Patient states BP at home 141/76, 136/76, 129/74, 133/71, 111/68, 116/71, 135/79, 121/69.  Patient takes Micardis 40 mg in a.m.  Will take Norvasc 5 mg after 6 PM.  Endorses palpitations, hearing heartbeat in ears after taking Norvasc.  Inquires about what her BP goals are. ? ?Patient states she was denied [place] x2.  Patient interested in losing weight.  States cravings are uncontrollable.  Denies association with increased eating and stress.  States physically feels hungry shortly after eating. ? ?Past Medical History:  ?Diagnosis Date  ? Arthritis   ? "left hip; right wrist" (06/12/2018)  ? Asthma   ? Depression   ? Family history of polyps in the colon   ? GERD (gastroesophageal reflux disease)   ? Glaucoma   ? Hypertension   ? Type 2 diabetes, diet controlled ([place])   ? ? ?Allergies  ?Allergen Reactions  ? Paxil [Paroxetine]   ?  Paxil 20 mg caused a tremor.  ? Penicillins Hives and Other (See Comments)  ?  PATIENT HAS HAD A PCN REACTION WITH IMMEDIATE RASH, FACIAL/TONGUE/THROAT SWELLING, SOB, OR LIGHTHEADEDNESS WITH HYPOTENSION:  #  #  YES  #  #  INCLUDING HIVES AS A CHILD ?Has patient had a PCN reaction causing severe rash involving mucus membranes or skin necrosis: No ?Has patient had a PCN reaction that required hospitalization:  No ?Has patient had a PCN reaction occurring within the last 10 years: No ?If all of the above answers are "NO", then may proceed with Cephalosporin use. ?PATIENT HAS HAD A PCN REACTION WITH IMMEDIATE RASH, FACIAL/TONGUE/THROAT SWELLING, SOB, OR LIGHTHEADEDNESS WITH HYPOTENSION:  #  #  YES  #  #  INCLUDING HIVES AS A CHILD ?Has patient had a PCN reaction causing severe rash involving mucus membranes or skin necrosis: No ?Has patient had a PCN reaction that required hospitalization: No ?Has patient had a PCN reaction occurring within the last 10 years: No ?If all of the above answers are "NO", then may proceed with Cephalosporin use. ?  ? Atorvastatin   ?  Joint pain on '40mg'$   ? Simvastatin   ?  Joint pain on '20mg'$  daily  ? ? ?ROS ?General: Denies fever, chills, night sweats +changes in weight, changes in appetite ?HEENT: Denies headaches, ear pain, changes in vision, rhinorrhea, sore throat ?CV: Denies CP, SOB, orthopnea + palpitations ?Pulm: Denies SOB, cough, wheezing ?GI: Denies abdominal pain, nausea, vomiting, diarrhea, constipation ?GU: Denies dysuria, hematuria, frequency, vaginal discharge ?Msk: Denies muscle cramps, joint pains ?Neuro: Denies weakness, numbness, tingling ?Skin: Denies rashes, bruising ?Psych: Denies depression, anxiety, hallucinations ? ?   ?Objective:  ?  ?Blood pressure 132/67, pulse 80, temperature  98.3 ?F (36.8 ?C), temperature source Oral, weight 204 lb 3.2 oz (92.6 kg), SpO2 100 %. ? ?Gen. Pleasant, well-nourished, in no distress, normal affect   ?HEENT: [place]/AT, face symmetric, conjunctiva clear, no scleral icterus, PERRLA, EOMI ?Lungs: no accessory muscle use ?Cardiovascular: RRR,  no peripheral edema ?Musculoskeletal: No deformities, no cyanosis or clubbing, normal tone ?Neuro:  A&Ox3, CN II-XII intact, normal gait ?Skin:  Warm, no lesions/ rash ? ?Wt Readings from Last 3 Encounters:  ?12/06/21 204 lb 3.2 oz (92.6 kg)  ?11/16/21 204 lb 3.2 oz (92.6 kg)  ?11/15/21 205 lb (93 kg)   ? ? ?Lab Results  ?Component Value Date  ? WBC 7.9 05/11/2021  ? HGB 13.3 05/11/2021  ? HCT 40.1 05/11/2021  ? PLT 292.0 05/11/2021  ? GLUCOSE 94 10/31/2021  ? CHOL 127 05/11/2021  ? TRIG 62.0 05/11/2021  ? HDL 53.50 05/11/2021  ? [place] 61 05/11/2021  ? ALT 18 05/11/2021  ? AST 19 05/11/2021  ? NA 144 10/31/2021  ? K 4.6 10/31/2021  ? CL 104 10/31/2021  ? CREATININE 0.77 10/31/2021  ? BUN 13 10/31/2021  ? CO2 27 10/31/2021  ? TSH 1.00 05/11/2021  ? HGBA1C 5.8 (A) 11/16/2021  ? ? ?Assessment/Plan: ? ?Essential hypertension ?-Controlled ?-BP 132/67 in clinic by this provider ?-Continue Micardis 40 mg daily and Lasix 20 mg twice daily ?-Discussed holding Norvasc 5 mg in afternoon as causing palpitations. ?-Patient encouraged to continue monitoring BP and keeping a log to bring with her to clinic.  For BP elevation in the evening greater than 140/90 consistently can take Norvasc 2.5 mg. ?-Follow-up in clinic in 1 to 2 months, sooner if needed ? ?Class 2 severe obesity due to excess calories with serious comorbidity and body mass index (BMI) of 35.0 to 35.9 in adult [facility]) ?-[id number] PA denied ?-Continue lifestyle modifications ?-Continue exercising daily ?- Plan: Amb Ref to Medical Weight Management ? ?Palpitations ?-a/w Norvasc 5 mg ?-will have pt hold norvasc to see if palpitations resolved. ?- If bp consistently greater than 140/90 take half tab of Norvasc 5 mg for dose of 2.5 mg. ? ?F/u in 2 months, sooner if needed ? ?Grier Mitts, MD ?

## 2021-12-06 NOTE — Patient Instructions (Addendum)
Continue telmisartan (Micardis) 40 mg daily and Lasix 20 mg twice a day. ? ?Continue checking your blood pressure in the afternoons.  If it is consistently greater than 140/90 (either number) then take half of the Norvasc 5 mg for dose of 2.5 mg. ?

## 2021-12-20 ENCOUNTER — Encounter: Payer: Self-pay | Admitting: Family Medicine

## 2022-01-30 DIAGNOSIS — Z0289 Encounter for other administrative examinations: Secondary | ICD-10-CM

## 2022-02-07 ENCOUNTER — Telehealth: Payer: Self-pay | Admitting: *Deleted

## 2022-02-07 ENCOUNTER — Ambulatory Visit (INDEPENDENT_AMBULATORY_CARE_PROVIDER_SITE_OTHER): Payer: Medicare Other | Admitting: Family Medicine

## 2022-02-07 ENCOUNTER — Encounter: Payer: Self-pay | Admitting: Family Medicine

## 2022-02-07 VITALS — BP 120/76 | HR 82 | Temp 97.8°F | Ht 64.0 in | Wt 202.6 lb

## 2022-02-07 DIAGNOSIS — Z6834 Body mass index (BMI) 34.0-34.9, adult: Secondary | ICD-10-CM

## 2022-02-07 DIAGNOSIS — I10 Essential (primary) hypertension: Secondary | ICD-10-CM | POA: Diagnosis not present

## 2022-02-07 DIAGNOSIS — E6609 Other obesity due to excess calories: Secondary | ICD-10-CM | POA: Diagnosis not present

## 2022-02-07 DIAGNOSIS — R519 Headache, unspecified: Secondary | ICD-10-CM | POA: Diagnosis not present

## 2022-02-07 NOTE — Telephone Encounter (Signed)
Patient informed Dr Volanda Napoleon during the office visit today that someone from [facility] called her after the visit in February, she is not happy about them having access to her chart and requested to speak with management.  Message sent to [facility], Engineer, building services and the patient is aware she will contact her via phone call [phone]).

## 2022-02-07 NOTE — Progress Notes (Signed)
Subjective:    Patient ID: Suzanne Conner, female    DOB: June 20, 1946, 76 y.o.   MRN: [id number]  Chief Complaint  Patient presents with   Follow-up    HPI Patient was seen today for HTN, weight.  Pt states bp 130s/70s at home.  BP seems lower after taking Jardiance.  Pt has concerns about lasix 20 mg.  Feels like it causes an occasional headache.  Patient drinking 32 ounces of water per day.  No longer taking Norvasc 2/2 dizziness.  Pt has f/u with Pulm and Cardiology in the next few months.  Pt has been really tried to decrease portions, decrease wine intake, and cut down on snacking.  Frustrated she has only lost 2 pounds.  States she is not enjoying life b/c she is limited in what she can eat.  Patient has an appointment with weight management in July.  Patient mention at prior office visit that she was contacted by someone in regards to scheduling that appointment.  At the time no mention of call/contact in chart.  Patient states after contacting the number to call us from [facility]  Patient expressed concern that [facility] had access to her health information without her knowledge.  Past Medical History:  Diagnosis Date   Arthritis    "left hip; right wrist" (06/12/2018)   Asthma    Depression    Family history of polyps in the colon    GERD (gastroesophageal reflux disease)    Glaucoma    Hypertension    Type 2 diabetes, diet controlled ([facility])     Allergies  Allergen Reactions   Paxil [Paroxetine]     Paxil 20 mg caused a tremor.   Penicillins Hives and Other (See Comments)    PATIENT HAS HAD A PCN REACTION WITH IMMEDIATE RASH, FACIAL/TONGUE/THROAT SWELLING, SOB, OR LIGHTHEADEDNESS WITH HYPOTENSION:  #  #  YES  #  #  INCLUDING HIVES AS A CHILD Has patient had a PCN reaction causing severe rash involving mucus membranes or skin necrosis: No Has patient had a PCN reaction that required hospitalization: No Has patient had a PCN reaction occurring within the last 10 years: No If all of the  above answers are "NO", then may proceed with Cephalosporin use. PATIENT HAS HAD A PCN REACTION WITH IMMEDIATE RASH, FACIAL/TONGUE/THROAT SWELLING, SOB, OR LIGHTHEADEDNESS WITH HYPOTENSION:  #  #  YES  #  #  INCLUDING HIVES AS A CHILD Has patient had a PCN reaction causing severe rash involving mucus membranes or skin necrosis: No Has patient had a PCN reaction that required hospitalization: No Has patient had a PCN reaction occurring within the last 10 years: No If all of the above answers are "NO", then may proceed with Cephalosporin use.    Atorvastatin     Joint pain on '40mg'$    Simvastatin     Joint pain on '20mg'$  daily    ROS General: Denies fever, chills, night sweats +changes in weight HEENT: Denies headaches, ear pain, changes in vision, rhinorrhea, sore throat +HA CV: Denies CP, palpitations, SOB, orthopnea Pulm: Denies SOB, cough, wheezing GI: Denies abdominal pain, nausea, vomiting, diarrhea, constipation GU: Denies dysuria, hematuria, frequency, vaginal discharge Msk: Denies muscle cramps, joint pains Neuro: Denies weakness, numbness, tingling Skin: Denies rashes, bruising Psych: Denies depression, anxiety, hallucinations     Objective:    Blood pressure 120/76, pulse 82, temperature 97.8 F (36.6 C), temperature source Oral, height '5\' 4"'$  (1.626 m), weight 202 lb 9.6 oz (91.9 kg), SpO2  98 %.Body mass index is 34.78 kg/m.  Gen. Pleasant, well-nourished, in no distress, normal affect   HEENT: [place]/AT, face symmetric, conjunctiva clear, no scleral icterus, PERRLA, EOMI, nares patent without drainage Lungs: no accessory muscle use, CTAB, no wheezes or rales Cardiovascular: RRR, no peripheral edema Musculoskeletal: No deformities, no cyanosis or clubbing, normal tone Neuro:  A&Ox3, CN II-XII intact, normal gait Skin:  Warm, no lesions/ rash   Wt Readings from Last 3 Encounters:  02/07/22 202 lb 9.6 oz (91.9 kg)  12/06/21 204 lb 3.2 oz (92.6 kg)  11/16/21 204 lb 3.2 oz  (92.6 kg)    Lab Results  Component Value Date   WBC 7.9 05/11/2021   HGB 13.3 05/11/2021   HCT 40.1 05/11/2021   PLT 292.0 05/11/2021   GLUCOSE 94 10/31/2021   CHOL 127 05/11/2021   TRIG 62.0 05/11/2021   HDL 53.50 05/11/2021   LDLCALC 61 05/11/2021   ALT 18 05/11/2021   AST 19 05/11/2021   NA 144 10/31/2021   K 4.6 10/31/2021   CL 104 10/31/2021   CREATININE 0.77 10/31/2021   BUN 13 10/31/2021   CO2 27 10/31/2021   TSH 1.00 05/11/2021   HGBA1C 5.8 (A) 11/16/2021    Assessment/Plan:  Essential hypertension -controlled -continue telmisartan 40 mg daily and lasix 20 mg daily -will d/c Norvasc 5 mg daily as pt held medication since last OFV and noticed improvement in dizziness. -pt encouraged to increase po intake of water and increase physical activity -continue monitoring bp at home. -Continue follow-up with cardiology  Class 1 obesity due to excess calories with serious comorbidity and body mass index (BMI) of 34.0 to 34.9 in adult -Body mass index is 34.78 kg/m. -Weight management appointment in July -Consider counseling to explore relationship with food -Continue lifestyle modifications  Acute non-intractable headache, unspecified headache type -Likely 2/2 dehydration as occasionally noted after taking Lasix -Patient encouraged to increase p.o. intake of water daily. -Continue to monitor  F/u in 3-4 months  Grier Mitts, MD

## 2022-02-20 ENCOUNTER — Ambulatory Visit (INDEPENDENT_AMBULATORY_CARE_PROVIDER_SITE_OTHER): Payer: Medicare Other

## 2022-02-20 ENCOUNTER — Ambulatory Visit (INDEPENDENT_AMBULATORY_CARE_PROVIDER_SITE_OTHER): Payer: Medicare Other | Admitting: Physician Assistant

## 2022-02-20 ENCOUNTER — Encounter: Payer: Self-pay | Admitting: Physician Assistant

## 2022-02-20 VITALS — BP 142/62 | HR 64 | Ht 64.0 in | Wt 202.2 lb

## 2022-02-20 DIAGNOSIS — R000 Tachycardia, unspecified: Secondary | ICD-10-CM

## 2022-02-20 DIAGNOSIS — R0600 Dyspnea, unspecified: Secondary | ICD-10-CM

## 2022-02-20 DIAGNOSIS — E782 Mixed hyperlipidemia: Secondary | ICD-10-CM | POA: Diagnosis not present

## 2022-02-20 DIAGNOSIS — I110 Hypertensive heart disease with heart failure: Secondary | ICD-10-CM | POA: Diagnosis not present

## 2022-02-20 DIAGNOSIS — E118 Type 2 diabetes mellitus with unspecified complications: Secondary | ICD-10-CM

## 2022-02-20 DIAGNOSIS — I10 Essential (primary) hypertension: Secondary | ICD-10-CM | POA: Diagnosis not present

## 2022-02-20 DIAGNOSIS — E669 Obesity, unspecified: Secondary | ICD-10-CM | POA: Diagnosis not present

## 2022-02-20 LAB — TSH: TSH: 0.889 u[IU]/mL (ref 0.450–4.500)

## 2022-02-20 LAB — BASIC METABOLIC PANEL
BUN/Creatinine Ratio: 20 (ref 12–28)
BUN: 14 mg/dL (ref 8–27)
CO2: 25 mmol/L (ref 20–29)
Calcium: 9.5 mg/dL (ref 8.7–10.3)
Chloride: 104 mmol/L (ref 96–106)
Creatinine, Ser: 0.69 mg/dL (ref 0.57–1.00)
Glucose: 104 mg/dL — ABNORMAL HIGH (ref 70–99)
Potassium: 4.4 mmol/L (ref 3.5–5.2)
Sodium: 142 mmol/L (ref 134–144)
eGFR: 91 mL/min/{1.73_m2} (ref >59)

## 2022-02-20 MED ORDER — METOPROLOL TARTRATE 50 MG PO TABS
ORAL_TABLET | ORAL | 0 refills | Status: DC
Start: 2022-02-20 — End: 2022-03-23

## 2022-02-20 NOTE — Progress Notes (Unsigned)
Enrolled patient for a 14 day Zio XT monitor to be mailed to patients home  Dr Ali Lowe to read

## 2022-02-20 NOTE — Patient Instructions (Signed)
Medication Instructions:  Your physician recommends that you continue on your current medications as directed. Please refer to the Current Medication list given to you today.  *If you need a refill on your cardiac medications before your next appointment, please call your pharmacy*   Lab Work: TODAY: BMET, TSH  If you have labs (blood work) drawn today and your tests are completely normal, you will receive your results only by: [place] (if you have MyChart) OR A paper copy in the mail If you have any lab test that is abnormal or we need to change your treatment, we will call you to review the results.   Testing/Procedures: Coronary CTA  Follow-Up: At [facility], you and your health needs are our priority.  As part of our continuing mission to provide you with exceptional heart care, we have created designated Provider Care Teams.  These Care Teams include your primary Cardiologist (physician) and Advanced Practice Providers (APPs -  Physician Assistants and Nurse Practitioners) who all work together to provide you with the care you need, when you need it.  Your next appointment:   As scheduled  Other Instructions See letter for CT Instructions  ZIO XT- Long Term Monitor Instructions  Your physician has requested you wear a ZIO patch monitor for 14 days.  This is a single patch monitor. [employer] supplies one patch monitor per enrollment. Additional stickers are not available. Please do not apply patch if you will be having a Nuclear Stress Test,  Echocardiogram, Cardiac CT, MRI, or Chest Xray during the period you would be wearing the  monitor. The patch cannot be worn during these tests. You cannot remove and re-apply the  ZIO XT patch monitor.  Your ZIO patch monitor will be mailed 3 day USPS to your address on file. It may take 3-5 days  to receive your monitor after you have been enrolled.  Once you have received your monitor, please review the enclosed  instructions. Your monitor  has already been registered assigning a specific monitor serial # to you.  Billing and Patient Assistance Program Information  We have supplied [employer] with any of your insurance information on file for billing purposes. [employer] offers a sliding scale Patient Assistance Program for patients that do not have  insurance, or whose insurance does not completely cover the cost of the ZIO monitor.  You must apply for the Patient Assistance Program to qualify for this discounted rate.  To apply, please call [employer] at [phone], select option 4, select option 2, ask to apply for  Patient Assistance Program. Theodore Demark will ask your household income, and how many people  are in your household. They will quote your out-of-pocket cost based on that information.  [employer] will also be able to set up a 60-month interest-free payment plan if needed.  Applying the monitor   Shave hair from upper left chest.  Hold abrader disc by orange tab. Rub abrader in 40 strokes over the upper left chest as  indicated in your monitor instructions.  Clean area with 4 enclosed alcohol pads. Let dry.  Apply patch as indicated in monitor instructions. Patch will be placed under collarbone on left  side of chest with arrow pointing upward.  Rub patch adhesive wings for 2 minutes. Remove white label marked "1". Remove the white  label marked "2". Rub patch adhesive wings for 2 additional minutes.  While looking in a mirror, press and release button in center of patch. A small green light will  flash 3-4 times. This will be your only indicator that the monitor has been turned on.  Do not shower for the first 24 hours. You may shower after the first 24 hours.  Press the button if you feel a symptom. You will hear a small click. Record Date, Time and  Symptom in the Patient Logbook.  When you are ready to remove the patch, follow instructions on the last 2 pages of Patient  Logbook. Stick patch  monitor onto the last page of Patient Logbook.  Place Patient Logbook in the blue and white box. Use locking tab on box and tape box closed  securely. The blue and white box has prepaid postage on it. Please place it in the mailbox as  soon as possible. Your physician should have your test results approximately 7 days after the  monitor has been mailed back to [facility].  Call [facility] at [phone] if you have questions regarding  your ZIO XT patch monitor. Call them immediately if you see an orange light blinking on your  monitor.  If your monitor falls off in less than 4 days, contact our Monitor department at [phone].  If your monitor becomes loose or falls off after 4 days call [employer] at [phone] for  suggestions on securing your monitor

## 2022-02-20 NOTE — Progress Notes (Signed)
Cardiology Office Note    Date:  02/20/2022   ID:  Suzanne, Conner 02/17/1946, MRN [id number]   PCP:  Billie Ruddy, MD   [place]  Cardiologist:  Early Osmond, MD   Advanced Practice Provider:  No care team member to display Electrophysiologist:  None   [phone]   No chief complaint on file.   History of Present Illness:  Suzanne Conner is a 76 y.o. female with history of HTN, HLD, DM2, obesity.  Patient saw Dr. Ali Lowe 10/23/21 with dyspnea felt secondary to weight gain and asthma and possibly diastolic dysfunction started on lasix bid., 2Decho 07/2021 normal LVEF G1DD, arthralgias on atorvastatin so switched to simvastatin. HCTZ stopped and micardis increased increased to 40 mg daily. Sent to pharmacy for obesity management but insurance denied wegovy.  Patient added onto my schedule because she thinks her quality of life isn't what she thinks it should be, ongoing shortness of breath, fatigue, anxiety. When she walks she says her HR goes up and she pants. Gets short of breath going up her stairs or walking in [employer]. Has some right jaw pain more related to chewying. Sister with CHF in her 34's. Has a fear that she has blockages and wants to make sure her heart is ok to push herself and start exercising.   Past Medical History:  Diagnosis Date   Arthritis    "left hip; right wrist" (06/12/2018)   Asthma    Depression    Family history of polyps in the colon    GERD (gastroesophageal reflux disease)    Glaucoma    Hypertension    Type 2 diabetes, diet controlled ([place])     Past Surgical History:  Procedure Laterality Date   BUNIONECTOMY Right    CATARACT EXTRACTION W/ INTRAOCULAR LENS IMPLANT Right    JOINT REPLACEMENT     REPLACEMENT TOTAL KNEE Right 2015   REVERSE SHOULDER ARTHROPLASTY Left 06/12/2018   REVERSE SHOULDER ARTHROPLASTY Left 06/12/2018   Procedure: LEFT REVERSE SHOULDER ARTHROPLASTY;  Surgeon: Justice Britain, MD;   Location: [facility];  Service: Orthopedics;  Laterality: Left;   SHOULDER ARTHROSCOPY W/ ROTATOR CUFF REPAIR Right 2015   TUBAL LIGATION      Current Medications: Current Meds  Medication Sig   albuterol (VENTOLIN HFA) 108 (90 Base) MCG/ACT inhaler Inhale 2 puffs into the lungs every 6 (six) hours as needed for wheezing or shortness of breath.   aspirin EC 81 MG tablet Take 81 mg by mouth daily.   Cholecalciferol (VITAMIN D) 2000 units CAPS Take 2,000 Units by mouth daily.   clindamycin (CLEOCIN) 300 MG capsule Take 300 mg by mouth 2 (two) times daily. Prior to dental appointments   Clobetasol Propionate 0.05 % shampoo Use a small amount on the affected areas twice a day as needed.   empagliflozin (JARDIANCE) 10 MG TABS tablet Take 1 tablet (10 mg total) by mouth daily before breakfast.   fluticasone-salmeterol (ADVAIR HFA) 230-21 MCG/ACT inhaler Inhale 2 puffs into the lungs 2 (two) times daily.   furosemide (LASIX) 20 MG tablet Take 1 tablet (20 mg total) by mouth 2 (two) times daily.   hydrocortisone valerate cream (WESTCORT) 0.2 % Apply 1 application topically 2 (two) times daily.   latanoprost (XALATAN) 0.005 % ophthalmic solution Place 1 drop into both eyes at bedtime.   Melatonin 10 MG TABS Take 1 tablet by mouth at bedtime as needed.   Multiple Vitamins-Minerals (CENTRUM SILVER PO) Take  1 tablet by mouth daily.    omeprazole (PRILOSEC) 40 MG capsule TAKE 1 CAPSULE TWICE A DAY   OPTIVE 0.5-0.9 % ophthalmic solution Place 1 drop into both eyes 2 (two) times daily.   Probiotic Product (TRUNATURE DIGESTIVE PROBIOTIC) CAPS Take 1 capsule by mouth daily.    RESTASIS 0.05 % ophthalmic emulsion Place 1 drop into both eyes in the morning and at bedtime.   rosuvastatin (CRESTOR) 5 MG tablet Take 1 tablet (5 mg total) by mouth daily.   telmisartan (MICARDIS) 40 MG tablet Take 1 tablet (40 mg total) by mouth daily.     Allergies:   Paxil [paroxetine], Penicillins, Atorvastatin, and Simvastatin    Social History   Socioeconomic History   Marital status: Married    Spouse name: Not on file   Number of children: Not on file   Years of education: Not on file   Highest education level: Not on file  Occupational History   Not on file  Tobacco Use   Smoking status: Never   Smokeless tobacco: Never  Vaping Use   Vaping Use: Never used  Substance and Sexual Activity   Alcohol use: Yes    Alcohol/week: 5.0 standard drinks of alcohol    Types: 5 Glasses of wine per week   Drug use: Never    Comment: 06/12/2018 CBD oil for pain   Sexual activity: Yes  Other Topics Concern   Not on file  Social History Narrative   Not on file   Social Determinants of Health   Financial Resource Strain: Not on file  Food Insecurity: Not on file  Transportation Needs: Not on file  Physical Activity: Not on file  Stress: Not on file  Social Connections: Not on file     Family History:  The patient's  family history includes Asthma in her brother; Cancer in her father; Dementia in her mother; Diabetes in her brother, brother, and brother; Healthy in her daughter and son; Heart disease in her sister.   ROS:   Please see the history of present illness.    ROS All other systems reviewed and are negative.   PHYSICAL EXAM:   VS:  BP (!) 142/62   Pulse 64   Ht '5\' 4"'$  (1.626 m)   Wt 202 lb 3.2 oz (91.7 kg)   SpO2 99%   BMI 34.71 kg/m   Physical Exam  TDV:VOHYW, in no acute distress  Neck: no JVD, carotid bruits, or masses Cardiac:RRR; no murmurs, rubs, or gallops  Respiratory:  clear to auscultation bilaterally, normal work of breathing GI: soft, nontender, nondistended, + BS Ext: without cyanosis, clubbing, or edema, Good distal pulses bilaterally Neuro:  Alert and Oriented x 3,  Psych: euthymic mood, full affect  Wt Readings from Last 3 Encounters:  02/20/22 202 lb 3.2 oz (91.7 kg)  02/07/22 202 lb 9.6 oz (91.9 kg)  12/06/21 204 lb 3.2 oz (92.6 kg)      Studies/Labs Reviewed:    EKG:  EKG is not ordered today.     Recent Labs: 05/11/2021: ALT 18; Hemoglobin 13.3; Platelets 292.0; TSH 1.00 10/31/2021: BUN 13; Creatinine, Ser 0.77; Potassium 4.6; Sodium 144   Lipid Panel    Component Value Date/Time   CHOL 127 05/11/2021 1019   TRIG 62.0 05/11/2021 1019   HDL 53.50 05/11/2021 1019   CHOLHDL 2 05/11/2021 1019   VLDL 12.4 05/11/2021 1019   LDLCALC 61 05/11/2021 1019   LDLCALC 76 03/18/2020 1135    Additional studies/  records that were reviewed today include:    TTE 12/22 1. Left ventricular ejection fraction, by estimation, is 60 to 65%. The  left ventricle has normal function. The left ventricle has no regional  wall motion abnormalities. Left ventricular diastolic parameters are  consistent with Grade I diastolic  dysfunction (impaired relaxation).   2. Right ventricular systolic function is normal. The right ventricular  size is normal. Tricuspid regurgitation signal is inadequate for assessing  PA pressure.   3. The mitral valve is normal in structure. Trivial mitral valve  regurgitation. No evidence of mitral stenosis.   4. The aortic valve is tricuspid. There is mild calcification of the  aortic valve. There is mild thickening of the aortic valve. Aortic valve  regurgitation is trivial. Aortic valve sclerosis/calcification is present,  without any evidence of aortic  stenosis.     Risk Assessment/Calculations:         ASSESSMENT:    1. Dyspnea, unspecified type   2. Essential hypertension   3. Mixed hyperlipidemia   4. Obesity (BMI 30-39.9)   5. Tachycardia   6. Controlled type 2 diabetes mellitus with complication, without long-term current use of insulin (HCC)      PLAN:  In order of problems listed above:  Dyspnea on exertion suspect multifactorial-obesity, deconditioning, asthma, diastolic CHF-currently compensated. Patient has a lot of anxiety about this and fear of something wrong with her heart. Will order coronary CTA and  Zio to rule out arrhythmia. Encourage weight management and regular exercise program.  HTN BP controlled  HLD LDL 61 05/2021  Obesity-insurance denied payment for wegovy- encourage weight watchers  Tachycardia-Heart racing with activity-used to be on metoprolol but stopped because thought is was contributing to dyspnea and worsening asthma. Check Zio  DM2  Shared Decision Making/Informed Consent        Medication Adjustments/Labs and Tests Ordered: Current medicines are reviewed at length with the patient today.  Concerns regarding medicines are outlined above.  Medication changes, Labs and Tests ordered today are listed in the Patient Instructions below. Patient Instructions  Medication Instructions:  Your physician recommends that you continue on your current medications as directed. Please refer to the Current Medication list given to you today.  *If you need a refill on your cardiac medications before your next appointment, please call your pharmacy*   Lab Work: TODAY: BMET, TSH  If you have labs (blood work) drawn today and your tests are completely normal, you will receive your results only by: [place] (if you have MyChart) OR A paper copy in the mail If you have any lab test that is abnormal or we need to change your treatment, we will call you to review the results.   Testing/Procedures: Coronary CTA  Follow-Up: At [facility], you and your health needs are our priority.  As part of our continuing mission to provide you with exceptional heart care, we have created designated Provider Care Teams.  These Care Teams include your primary Cardiologist (physician) and Advanced Practice Providers (APPs -  Physician Assistants and Nurse Practitioners) who all work together to provide you with the care you need, when you need it.  Your next appointment:   As scheduled  Other Instructions See letter for CT Instructions  ZIO XT- Long Term Monitor  Instructions  Your physician has requested you wear a ZIO patch monitor for 14 days.  This is a single patch monitor. [employer] supplies one patch monitor per enrollment. Additional stickers are not available. Please  do not apply patch if you will be having a Nuclear Stress Test,  Echocardiogram, Cardiac CT, MRI, or Chest Xray during the period you would be wearing the  monitor. The patch cannot be worn during these tests. You cannot remove and re-apply the  ZIO XT patch monitor.  Your ZIO patch monitor will be mailed 3 day USPS to your address on file. It may take 3-5 days  to receive your monitor after you have been enrolled.  Once you have received your monitor, please review the enclosed instructions. Your monitor  has already been registered assigning a specific monitor serial # to you.  Billing and Patient Assistance Program Information  We have supplied [employer] with any of your insurance information on file for billing purposes. [employer] offers a sliding scale Patient Assistance Program for patients that do not have  insurance, or whose insurance does not completely cover the cost of the ZIO monitor.  You must apply for the Patient Assistance Program to qualify for this discounted rate.  To apply, please call [employer] at [phone], select option 4, select option 2, ask to apply for  Patient Assistance Program. Theodore Demark will ask your household income, and how many people  are in your household. They will quote your out-of-pocket cost based on that information.  [employer] will also be able to set up a 65-month interest-free payment plan if needed.  Applying the monitor   Shave hair from upper left chest.  Hold abrader disc by orange tab. Rub abrader in 40 strokes over the upper left chest as  indicated in your monitor instructions.  Clean area with 4 enclosed alcohol pads. Let dry.  Apply patch as indicated in monitor instructions. Patch will be placed under collarbone on left  side  of chest with arrow pointing upward.  Rub patch adhesive wings for 2 minutes. Remove white label marked "1". Remove the white  label marked "2". Rub patch adhesive wings for 2 additional minutes.  While looking in a mirror, press and release button in center of patch. A small green light will  flash 3-4 times. This will be your only indicator that the monitor has been turned on.  Do not shower for the first 24 hours. You may shower after the first 24 hours.  Press the button if you feel a symptom. You will hear a small click. Record Date, Time and  Symptom in the Patient Logbook.  When you are ready to remove the patch, follow instructions on the last 2 pages of Patient  Logbook. Stick patch monitor onto the last page of Patient Logbook.  Place Patient Logbook in the blue and white box. Use locking tab on box and tape box closed  securely. The blue and white box has prepaid postage on it. Please place it in the mailbox as  soon as possible. Your physician should have your test results approximately 7 days after the  monitor has been [facility]  Call [place] [phone] you have questions regarding  your ZIO XT patch monitor. Call them immediately if you see an orange light blinking on your  monitor.  If your monitor falls off in less than 4 days, contact our Monitor department at [phone]  If your monitor becomes loose or falls off after 4 days call [employer] at [phone]  suggestions on securing your monitor     Signed, MErmalinda Barrios PHershal Coria 02/20/2022 10:44 AM    [place]  [address], [place], [place]  [address] Phone: [phone]; Fax: [phone]    

## 2022-02-22 ENCOUNTER — Encounter: Payer: Self-pay | Admitting: Pulmonary Disease

## 2022-02-22 ENCOUNTER — Ambulatory Visit (INDEPENDENT_AMBULATORY_CARE_PROVIDER_SITE_OTHER): Payer: Medicare Other | Admitting: Pulmonary Disease

## 2022-02-22 VITALS — BP 122/70 | HR 72 | Temp 97.9°F | Ht 64.0 in | Wt 201.8 lb

## 2022-02-22 DIAGNOSIS — G4719 Other hypersomnia: Secondary | ICD-10-CM

## 2022-02-22 DIAGNOSIS — H25812 Combined forms of age-related cataract, left eye: Secondary | ICD-10-CM | POA: Diagnosis not present

## 2022-02-22 DIAGNOSIS — H401132 Primary open-angle glaucoma, bilateral, moderate stage: Secondary | ICD-10-CM | POA: Diagnosis not present

## 2022-02-22 DIAGNOSIS — R5383 Other fatigue: Secondary | ICD-10-CM | POA: Diagnosis not present

## 2022-02-22 DIAGNOSIS — H18513 Endothelial corneal dystrophy, bilateral: Secondary | ICD-10-CM | POA: Diagnosis not present

## 2022-02-22 DIAGNOSIS — J4520 Mild intermittent asthma, uncomplicated: Secondary | ICD-10-CM

## 2022-02-22 DIAGNOSIS — H04123 Dry eye syndrome of bilateral lacrimal glands: Secondary | ICD-10-CM | POA: Diagnosis not present

## 2022-02-22 MED ORDER — PREDNISONE 20 MG PO TABS: 40.0000 mg | ORAL_TABLET | Freq: Every day | ORAL | 0 refills | Status: AC

## 2022-02-22 MED ORDER — ALBUTEROL SULFATE HFA 108 (90 BASE) MCG/ACT IN AERS: 2.0000 | INHALATION_SPRAY | Freq: Four times a day (QID) | RESPIRATORY_TRACT | 2 refills | Status: DC | PRN

## 2022-02-22 MED ORDER — FLUTICASONE-SALMETEROL 500-50 MCG/ACT IN AEPB: 1.0000 | INHALATION_SPRAY | Freq: Two times a day (BID) | RESPIRATORY_TRACT | 0 refills | Status: DC

## 2022-02-22 NOTE — Patient Instructions (Addendum)
  Fatigue, snoring, excessive daytime sleepiness Epworth 15 ORDER home sleep study  Mild/moderatere persistent asthma with exacerbation - worsening shortness of breath, wheezing - STOP Advair 230-21 mcg  - START prednisone 40 mg x 5 days - START Advair Diskus 500-50 ONE puff TWICE a day - CONTINUE Albuterol TWO puffs as needed for shortness of breath or wheezing - ORDER pulmonary function test   Follow-up with me in 2 months with PFTs prior to visit

## 2022-02-22 NOTE — Progress Notes (Signed)
Subjective:   PATIENT ID: Suzanne Conner GENDER: female DOB: 1946-04-22, MRN: [id number]   HPI  Chief Complaint  Patient presents with   Follow-up    Pt states she has been doing okay since last visit and denies any real complaints.    Reason for Visit: Follow-up  Ms. Suzanne Conner is a 76 year old female never smoker with childhood asthma, diabetes (diet controlled), hypertension and GERD who presents for follow-up  Synopsis: She was previously seen by a Pulmonary doctor in [place] 22 years ago for sarcoid diagnosed via bronchoscopy. She is unable to recall any prolonged treatment. Has not had any respiratory issues since then. Was only seen for ~1 year and reports CXR improved during that time. Denies skin involvement except for seborrheic keratosis. Sees an ophthalmologist for high optic pressures. Two months prior to our consultation, she reports shortness of breath and wheezing. She was treated with prednisone and albuterol at an urgent care. Still had persistent wheezing especially worse at night and activity. She lives in a two story home and now having difficulty. Denies cough, chest pain. Compliant with her Advair which she takes two puffs BID. Still has breakthrough wheezing. Does not use albuterol. No longer has nocturnal awakenings since starting Advair  01/04/21 Since our last visit, we increased her Advair to 230-21 mcg. She reports minimal wheezing at night which is improved. Still has shortness of breath. She tries not to exert herself because she gets short of breath. Walking around the store can be difficult. Since the pandemic, she has been less active and has had 15lb weight gain during the time. She is wanting to be more active [facility] and doing [facility]obics.  03/21/21 Since our last visit, she has become more active. She started Pulmonary Rehab two weeks ago and is starting to challenging herself. She is going to water aerobics twice a week. Still  has shortness of breath and fatigue after activity but this is much improved. Compliant with her Advair. Uses albuterol twice a week. Reports wheezing at night has improved.   02/22/22 Since  our last visit she reports unchanged fatigue and shortness of breath. Completed pulmonary rehab with some benefit. No longer doing water aerobics but is walking twice a week 30 minutes at a slow pace and sometimes exercise bike. Does have wheezing with exertion and at night. She is compliant with her Advair and Albuterol. She reports she is snoring and previously [place] sleep [place]. Has gained 20 lbs since the last test. No witness apnea however sleep quality is poor with frequent nighttime awakenings with max four hours in a row but will awaken after one hours. Reports headaches in the morning. Denies falling asleep while talking to people.     02/22/2022   11:12 AM  Results of the Epworth flowsheet  Sitting and reading 3  Watching TV 2  Sitting, inactive in a public place (e.g. a theatre or a meeting) 3  As a passenger in a car for an hour without a break 3  Lying down to rest in the afternoon when circumstances permit 2  Sitting and talking to someone 0  Sitting quietly after a lunch without alcohol 2  In a car, while stopped for a few minutes in traffic 0  Total score 15     Review of Systems  Constitutional:  Positive for malaise/fatigue. Negative for chills, diaphoresis, fever and weight loss.  HENT:  Negative  for congestion.   Respiratory:  Positive for shortness of breath and wheezing. Negative for cough, hemoptysis and sputum production.   Cardiovascular:  Negative for chest pain, palpitations and leg swelling.     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   Penicillins Hives and Other (See Comments)    PATIENT HAS HAD A PCN REACTION WITH IMMEDIATE RASH, FACIAL/TONGUE/THROAT SWELLING, SOB, OR LIGHTHEADEDNESS WITH HYPOTENSION:  #  #  YES  #  #  INCLUDING HIVES AS A CHILD Has patient had a PCN reaction causing severe rash involving mucus membranes or skin necrosis: No Has patient had a PCN reaction that required hospitalization: No Has patient had a PCN reaction occurring within the last 10 years: No If all of the above answers are "NO", then may proceed with Cephalosporin use. PATIENT HAS HAD A PCN REACTION WITH IMMEDIATE RASH, FACIAL/TONGUE/THROAT SWELLING, SOB, OR LIGHTHEADEDNESS WITH HYPOTENSION:  #  #  YES  #  #  INCLUDING HIVES AS A CHILD Has patient had a PCN reaction causing severe rash involving mucus membranes or skin necrosis: No Has patient had a PCN reaction that required hospitalization: No Has patient had a PCN reaction occurring within the last 10 years: No If all of the above answers are "NO", then may proceed with Cephalosporin use.    Atorvastatin     Joint pain on '40mg'$    Simvastatin     Joint pain on '20mg'$  daily     Outpatient Medications Prior to Visit  Medication Sig Dispense Refill   aspirin EC 81 MG tablet Take 81 mg by mouth daily.     Cholecalciferol (VITAMIN D) 2000 units CAPS Take 2,000 Units by mouth daily.     clindamycin (CLEOCIN) 300 MG capsule Take 300 mg by mouth 2 (two) times daily. Prior to dental appointments     Clobetasol Propionate 0.05 % shampoo Use a small amount on the affected areas twice a day as needed. 118 mL 0   empagliflozin (JARDIANCE) 10 MG TABS tablet Take 1 tablet (10 mg total) by mouth daily before breakfast. 90 tablet 3   furosemide (LASIX) 20 MG tablet Take 1 tablet (20 mg total) by mouth 2 (two) times daily. 180  tablet 3   hydrocortisone valerate cream (WESTCORT) 0.2 % Apply 1 application topically 2 (two) times daily. 45 g 0   latanoprost (XALATAN) 0.005 % ophthalmic solution Place 1 drop into both eyes at bedtime.     Melatonin 10 MG TABS Take 1 tablet by mouth at bedtime as needed.     metoprolol tartrate (LOPRESSOR) 50 MG tablet Take 1 tablet 2 hours prior to CT Scan 1 tablet 0   Multiple Vitamins-Minerals (CENTRUM SILVER PO) Take 1 tablet by mouth daily.      omeprazole (PRILOSEC) 40 MG capsule TAKE 1 CAPSULE TWICE A DAY 180 capsule 3   OPTIVE 0.5-0.9 % ophthalmic solution Place 1 drop into both eyes 2 (two) times daily.     Probiotic Product (TRUNATURE DIGESTIVE PROBIOTIC)  CAPS Take 1 capsule by mouth daily.      RESTASIS 0.05 % ophthalmic emulsion Place 1 drop into both eyes in the morning and at bedtime.     rosuvastatin (CRESTOR) 5 MG tablet Take 1 tablet (5 mg total) by mouth daily. 90 tablet 3   telmisartan (MICARDIS) 40 MG tablet Take 1 tablet (40 mg total) by mouth daily. 90 tablet 3   albuterol (VENTOLIN HFA) 108 (90 Base) MCG/ACT inhaler Inhale 2 puffs into the lungs every 6 (six) hours as needed for wheezing or shortness of breath. 3 each 2   fluticasone-salmeterol (ADVAIR HFA) 230-21 MCG/ACT inhaler Inhale 2 puffs into the lungs 2 (two) times daily. 3 each 3   No facility-administered medications prior to visit.    Objective:   Vitals:   02/22/22 1041  BP: 122/70  Pulse: 72  Temp: 97.9 F (36.6 C)  TempSrc: Oral  SpO2: 98%  Weight: 201 lb 12.8 oz (91.5 kg)  Height: '5\' 4"'$  (1.626 m)   SpO2: 98 % (RA) O2 Device: None (Room air)  Physical Exam: General: Well-appearing, no acute distress HENT: [place], AT Eyes: EOMI, no scleral icterus Respiratory: Clear to auscultation bilaterally.  No crackles, wheezing or rales Cardiovascular: RRR, -M/R/G, no JVD Extremities:-Edema,-tenderness Neuro: AAO x4, CNII-XII grossly intact Psych: Normal mood, normal affect  Data  Reviewed:  Imaging: CXR 07/12/2020-no infiltrate, effusion or edema.  PFT: 01/04/21 FVC 1.49 (62%) FEV1 1.10 (59%) Ratio 73  TLC 63% DLCO 62% Interpretation: Mixed obstructive and restrictive defect with mildly reduced DLCO. No significant bronchodilator response however does not preclude benefit of bronchodilators  Labs: CBC    Component Value Date/Time   WBC 7.9 05/11/2021 1019   RBC 4.71 05/11/2021 1019   HGB 13.3 05/11/2021 1019   HCT 40.1 05/11/2021 1019   PLT 292.0 05/11/2021 1019   MCV 85.1 05/11/2021 1019   MCH 27.7 03/18/2020 1135   MCHC 33.2 05/11/2021 1019   RDW 14.3 05/11/2021 1019   LYMPHSABS 2.9 05/11/2021 1019   MONOABS 0.7 05/11/2021 1019   EOSABS 0.1 05/11/2021 1019   BASOSABS 0.0 05/11/2021 1019  Absolute eos 03/18/20-70    Assessment & Plan:   Discussion: 76 year old female never smoker with history of childhood asthma and pulmonary sarcoid for follow-up. Based on her history of sarcoid management normal CXR, she likely had Lofgren syndrome.  PFTs demonstrate mixed defect likely related to asthma in setting of sarcoid. Current symptoms may be related to uncontrolled asthma, undiagnosed OSA or recurrence of sarcoid. Will rule out the initial two concerns first with bronchodilator management and sleep study as noted below. If symptoms persistent and w/u negative will need to consider echocardiogram and PET for possible sarcoid  Fatigue, snoring Epworth 15 ORDER home sleep study  Mild/moderatere persistent asthma - worsening shortness of breath, wheezing - STOP Advair 230-21 mcg  - START Advair Diskus 500-50 ONE puff TWICE a day - CONTINUE Albuterol TWO puffs as needed for shortness of breath or wheezing - ORDER pulmonary function test   Asthma action plan Increase Advair 2 puffs three times a day for worsening shortness of breath, wheezing and cough. If you symptoms do not improve in 24-48 hours, please our office for evaluation and/or prednisone taper.    Pulmonary sarcoidosis- not active - Dx in 1999 via endobronchial bx. No records available - No indication for prednisone therapy - Annual PFTs. Due 12/2021 - Recommend annual ophthalmology exam. Last visit on Dec 2021. Currently scheduled in 02/2021 -  Reviewed EKG. Prolonged P-R interval - CONTINUE Pulmonary Rehab - Plan for repeat PFTs in 6-12 months pending clinical improvement (06/2021)   Health Maintenance Immunization History  Administered Date(s) Administered   Fluad Quad(high Dose 65+) 06/22/2020   Influenza, High Dose Seasonal PF 05/12/2019, 06/10/2021   Influenza-Unspecified 05/29/2018, 05/12/2019   PFIZER(Purple Top)SARS-COV-2 Vaccination 10/01/2019, 10/22/2019, 06/17/2020, 12/08/2020   Pneumococcal Polysaccharide-23 06/13/2018   Zoster Recombinat (Shingrix) 03/10/2019, 05/12/2019   Zoster, Live 04/27/2008   CT Lung Screen - not indicated  Orders Placed This Encounter  Procedures   Pulmonary function test    Standing Status:   Future    Standing Expiration Date:   02/23/2023    Order Specific Question:   Where should this test be performed?    Answer:   [facility]    Order Specific Question:   Full PFT: includes the following: basic spirometry, spirometry pre & post bronchodilator, diffusion capacity (DLCO), lung volumes    Answer:   Full PFT   Home sleep test    Standing Status:   Future    Standing Expiration Date:   02/23/2023    Order Specific Question:   Where should this test be performed:    Answer:   [facility]   Meds ordered this encounter  Medications   fluticasone-salmeterol (ADVAIR DISKUS) 500-50 MCG/ACT AEPB    Sig: Inhale 1 puff into the lungs in the morning and at bedtime.    Dispense:  180 each    Refill:  0   albuterol (VENTOLIN HFA) 108 (90 Base) MCG/ACT inhaler    Sig: Inhale 2 puffs into the lungs every 6 (six) hours as needed for wheezing or shortness of breath.    Dispense:  3 each    Refill:  2    Provide 3 month supply.    predniSONE (DELTASONE) 20 MG tablet    Sig: Take 2 tablets (40 mg total) by mouth daily with breakfast for 5 days.    Dispense:  10 tablet    Refill:  0    Return in about 2 months (around 04/24/2022).  I have spent a total time of 35-minutes on the day of the appointment including chart review, data review, collecting history, coordinating care and discussing medical diagnosis and plan with the patient/family. Past medical history, allergies, medications were reviewed. Pertinent imaging, labs and tests included in this note have been reviewed and interpreted independently by me.  [place], MD [facility] 02/22/2022 12:28 PM  Office Number [phone]

## 2022-02-25 DIAGNOSIS — R000 Tachycardia, unspecified: Secondary | ICD-10-CM | POA: Diagnosis not present

## 2022-03-19 ENCOUNTER — Telehealth (HOSPITAL_COMMUNITY): Payer: Self-pay | Admitting: Emergency Medicine

## 2022-03-19 NOTE — Telephone Encounter (Signed)
Reaching out to patient to offer assistance regarding upcoming cardiac imaging study; pt verbalizes understanding of appt date/time, parking situation and where to check in, pre-test NPO status and medications ordered, and verified current allergies; name and call back number provided for further questions should they arise Marchia Bond RN Navigator Cardiac Imaging [facility] [phone] office [phone] cell  Difficult IV (encouraged PO hydration) Arrival 830 '50mg'$  metoprolol 2 hr prior to scan

## 2022-03-20 ENCOUNTER — Ambulatory Visit (HOSPITAL_COMMUNITY)
Admission: RE | Admit: 2022-03-20 | Discharge: 2022-03-20 | Disposition: A | Discharge status: Home / Self Care | Payer: Medicare Other | Source: Ambulatory Visit | Attending: Physician Assistant | Admitting: Physician Assistant

## 2022-03-20 DIAGNOSIS — R0600 Dyspnea, unspecified: Secondary | ICD-10-CM | POA: Diagnosis not present

## 2022-03-20 DIAGNOSIS — I110 Hypertensive heart disease with heart failure: Secondary | ICD-10-CM | POA: Diagnosis not present

## 2022-03-20 MED ORDER — NITROGLYCERIN 0.4 MG SL SUBL
0.8000 mg | SUBLINGUAL_TABLET | Freq: Once | SUBLINGUAL | Status: AC
  Administered 2022-03-20: 0.8 mg via SUBLINGUAL

## 2022-03-20 MED ORDER — IOHEXOL 350 MG/ML SOLN
100.0000 mL | Freq: Once | INTRAVENOUS | Status: AC | PRN
  Administered 2022-03-20: 100 mL via INTRAVENOUS

## 2022-03-20 MED ORDER — NITROGLYCERIN 0.4 MG SL SUBL
SUBLINGUAL_TABLET | SUBLINGUAL | Status: AC
  Filled 2022-03-20: qty 2

## 2022-03-21 DIAGNOSIS — R000 Tachycardia, unspecified: Secondary | ICD-10-CM | POA: Diagnosis not present

## 2022-03-22 ENCOUNTER — Encounter (INDEPENDENT_AMBULATORY_CARE_PROVIDER_SITE_OTHER): Payer: Self-pay | Admitting: Bariatrics

## 2022-03-22 ENCOUNTER — Ambulatory Visit (INDEPENDENT_AMBULATORY_CARE_PROVIDER_SITE_OTHER): Payer: Medicare Other | Admitting: Bariatrics

## 2022-03-22 VITALS — BP 133/72 | HR 95 | Temp 99.1°F | Ht 62.0 in | Wt 197.0 lb

## 2022-03-22 DIAGNOSIS — J45909 Unspecified asthma, uncomplicated: Secondary | ICD-10-CM

## 2022-03-22 DIAGNOSIS — R0602 Shortness of breath: Secondary | ICD-10-CM | POA: Diagnosis not present

## 2022-03-22 DIAGNOSIS — E1169 Type 2 diabetes mellitus with other specified complication: Secondary | ICD-10-CM | POA: Diagnosis not present

## 2022-03-22 DIAGNOSIS — I10 Essential (primary) hypertension: Secondary | ICD-10-CM | POA: Diagnosis not present

## 2022-03-22 DIAGNOSIS — E669 Obesity, unspecified: Secondary | ICD-10-CM | POA: Diagnosis not present

## 2022-03-22 DIAGNOSIS — Z6836 Body mass index (BMI) 36.0-36.9, adult: Secondary | ICD-10-CM | POA: Diagnosis not present

## 2022-03-22 DIAGNOSIS — R5383 Other fatigue: Secondary | ICD-10-CM | POA: Diagnosis not present

## 2022-03-22 DIAGNOSIS — Z7984 Long term (current) use of oral hypoglycemic drugs: Secondary | ICD-10-CM

## 2022-03-22 DIAGNOSIS — E7849 Other hyperlipidemia: Secondary | ICD-10-CM

## 2022-03-22 DIAGNOSIS — E559 Vitamin D deficiency, unspecified: Secondary | ICD-10-CM | POA: Diagnosis not present

## 2022-03-22 DIAGNOSIS — Z1331 Encounter for screening for depression: Secondary | ICD-10-CM | POA: Diagnosis not present

## 2022-03-22 DIAGNOSIS — E782 Mixed hyperlipidemia: Secondary | ICD-10-CM | POA: Diagnosis not present

## 2022-03-23 ENCOUNTER — Other Ambulatory Visit: Payer: Self-pay

## 2022-03-23 ENCOUNTER — Encounter: Payer: Self-pay | Admitting: Physician Assistant

## 2022-03-23 DIAGNOSIS — I471 Supraventricular tachycardia, unspecified: Secondary | ICD-10-CM | POA: Insufficient documentation

## 2022-03-23 HISTORY — DX: Supraventricular tachycardia, unspecified: I47.10

## 2022-03-23 LAB — LIPID PANEL WITH LDL/HDL RATIO
Cholesterol, Total: 133 mg/dL (ref 100–199)
HDL: 58 mg/dL (ref >39)
LDL Chol Calc (NIH): 62 mg/dL (ref 0–99)
LDL/HDL Ratio: 1.1 ratio (ref 0.0–3.2)
Triglycerides: 65 mg/dL (ref 0–149)
VLDL Cholesterol Cal: 13 mg/dL (ref 5–40)

## 2022-03-23 LAB — HEMOGLOBIN A1C
Est. average glucose Bld gHb Est-mCnc: 126 mg/dL
Hgb A1c MFr Bld: 6 % — ABNORMAL HIGH (ref 4.8–5.6)

## 2022-03-23 LAB — VITAMIN D 25 HYDROXY (VIT D DEFICIENCY, FRACTURES): Vit D, 25-Hydroxy: 80.2 ng/mL (ref 30.0–100.0)

## 2022-03-23 LAB — INSULIN, RANDOM: INSULIN: 9.6 u[IU]/mL (ref 2.6–24.9)

## 2022-03-23 MED ORDER — NEBIVOLOL HCL 2.5 MG PO TABS: 2.5000 mg | ORAL_TABLET | Freq: Every day | ORAL | 0 refills | Status: DC

## 2022-03-26 ENCOUNTER — Telehealth: Payer: Self-pay | Admitting: Physician Assistant

## 2022-03-26 NOTE — Telephone Encounter (Signed)
Follow Up:      Patient is check up on the prior authorization for her Nebivolol, The pharmacist said they faxed it over on Friday. She wanted to know if it was received?

## 2022-03-26 NOTE — Telephone Encounter (Signed)
Returned call to patient, she states prior authorization for new Rx Nebivolol 2.'5mg'$  QD is needed before she can start the medication.  Patient states she is anxious to start this new medication to see if it helps her to start feeling better.  Prior Auth request was received in [place].  Will forward to prior [place] [facility].

## 2022-03-27 ENCOUNTER — Encounter (INDEPENDENT_AMBULATORY_CARE_PROVIDER_SITE_OTHER): Payer: Self-pay | Admitting: Bariatrics

## 2022-03-27 ENCOUNTER — Ambulatory Visit (INDEPENDENT_AMBULATORY_CARE_PROVIDER_SITE_OTHER): Payer: Medicare Other | Admitting: Bariatrics

## 2022-03-27 NOTE — Telephone Encounter (Signed)
**Note De-Identified Suzanne Conner Obfuscation** [place] Key: [id number] - PA Case ID: [id number] Outcome: Approved today [id number];KGYBNL:[id number];Review Type Prior Auth;Coverage Start Date:02/25/2022;Coverage End Date:09/09/2098 Drug: Nebivolol HCl 2.'5MG'$  tablets Form [employer] Electronic PA Form (2017 NCPDP)  I have notified the pt and [id number] [place](Ph: [phone] of this approval.

## 2022-03-27 NOTE — Progress Notes (Signed)
Chief Complaint:   OBESITY Suzanne Conner (MR# [id number]) is a 76 y.o. female who presents for evaluation and treatment of obesity and related comorbidities. Current BMI is Body mass index is 36.03 kg/m. Suzanne Conner has been struggling with her weight for many years and has been unsuccessful in either losing weight, maintaining weight loss, or reaching her healthy weight goal.  Suzanne Conner likes to cook sometimes.  She craves sweets.  She notes increased hunger and cravings.  Suzanne Conner is currently in the action stage of change and ready to dedicate time achieving and maintaining a healthier weight. Suzanne Conner is interested in becoming our patient and working on intensive lifestyle modifications including (but not limited to) diet and exercise for weight loss.  Suzanne Conner's habits were reviewed today and are as follows: Her family eats meals together, she thinks her family will eat healthier with her, her desired weight loss is 27 lbs, she has been heavy most of her life, she started gaining weight at age 78, her heaviest weight ever was 230 pounds, she has significant food cravings issues, she snacks frequently in the evenings, she wakes up frequently in the middle of the night to eat, she is frequently drinking liquids with calories, she frequently makes poor food choices, she has problems with excessive hunger, she frequently eats larger portions than normal, and she struggles with emotional eating.  Depression Screen Suzanne Conner Food and Mood (modified PHQ-9) score was 14.     02/07/2022   10:24 AM  Depression screen PHQ 2/9  Decreased Interest 0  Down, Depressed, Hopeless 0  PHQ - 2 Score 0  Altered sleeping 0  Tired, decreased energy 1  Change in appetite 1  Feeling bad or failure about yourself  0  Trouble concentrating 0  Moving slowly or fidgety/restless 0  Suicidal thoughts 0  PHQ-9 Score 2   Subjective:   1. Other fatigue Suzanne Conner admits to daytime somnolence and admits to waking  up still tired. Patient has a history of symptoms of daytime fatigue and morning fatigue. Suzanne Conner generally gets 6 or 7 hours of sleep per night, and states that she has nightime awakenings. Snoring is present. Apneic episodes are not present. Epworth Sleepiness Score is 7.   2. SOB (shortness of breath) on exertion Suzanne Conner notes increasing shortness of breath with exercising and seems to be worsening over time with weight gain. She notes getting out of breath sooner with activity than she used to. This has not gotten worse recently. Suzanne Conner denies shortness of breath at rest or orthopnea.  3. Essential hypertension Suzanne Conner is taking telmisartan and Lasix.  4. Asthma, unspecified asthma severity, unspecified whether complicated, unspecified whether persistent Suzanne Conner uses her inhalers as needed.  5. Vitamin D deficiency Suzanne Conner is currently taking vitamin D.  6. Other hyperlipidemia Suzanne Conner is taking Crestor currently.  7. Diabetes mellitus type 2 in obese Suzanne Suzanne Conner) Suzanne Conner is currently taking Jardiance.  Assessment/Plan:   1. Other fatigue Suzanne Conner does feel that her weight is causing her energy to be lower than it should be. Fatigue may be related to obesity, depression or many other causes. Labs will be ordered, and in the meanwhile, Suzanne Conner will focus on self care including making healthy food choices, increasing physical activity and focusing on stress reduction.  2. SOB (shortness of breath) on exertion Suzanne Conner does feel that she gets out of breath more easily that she used to when she exercises. Suzanne Conner's shortness of breath appears to be obesity related and exercise induced.  She has agreed to work on weight loss and gradually increase exercise to treat her exercise induced shortness of breath. Will continue to monitor closely.  3. Essential hypertension Suzanne Conner will continue her medications, and we will follow-up at her next visit.  4. Asthma, unspecified asthma severity,  unspecified whether complicated, unspecified whether persistent Suzanne Conner will continue her inhalers per her PCP.  5. Vitamin D deficiency We will check labs today, and we will follow-up at Suzanne Conner next visit.  - VITAMIN D 25 Hydroxy (Vit-D Deficiency, Fractures)  6. Other hyperlipidemia We will check labs today.  Suzanne Conner will continue her medications, and we will follow-up at her next visit.  - Lipid Panel With LDL/HDL Ratio  7. Diabetes mellitus type 2 in obese [name] will continue [name].  We will check labs today, and we will follow-up at her next visit.  - Hemoglobin A1c - Insulin, random  8. Depression screening Suzanne Conner had a positive depression screening. Depression is commonly associated with obesity and often results in emotional eating behaviors. We will monitor this closely and work on CBT to help improve the non-hunger eating patterns. Referral to Psychology may be required if no improvement is seen as she continues in our clinic.  9. Class 2 severe obesity with serious comorbidity and body mass index (BMI) of 36.0 to 36.9 in adult, unspecified obesity type [name] is currently in the action stage of change and her goal is to continue with weight loss efforts. I recommend Suzanne Conner begin the structured treatment plan as follows:  She has agreed to the Category 1 Plan.  Intentional eating was discussed.  Reviewed labs with the patient from 02/20/2021, BMP, glucose, TSH.  Reviewed labs from 11/16/2021, A1c.  Exercise goals: No exercise has been prescribed at this time.   Behavioral modification strategies: increasing lean protein intake, decreasing simple carbohydrates, increasing vegetables, increasing water intake, decreasing eating out, no skipping meals, meal planning and cooking strategies, keeping healthy foods in the home, and planning for success.  She was informed of the importance of frequent follow-up visits to maximize her success with intensive  lifestyle modifications for her multiple health conditions. She was informed we would discuss her lab results at her next visit unless there is a critical issue that needs to be addressed sooner. Suzanne Conner agreed to keep her next visit at the agreed upon time to discuss these results.  Objective:   Blood pressure 133/72, pulse 95, temperature 99.1 F (37.3 C), height '5\' 2"'$  (1.575 m), weight 197 lb (89.4 kg), SpO2 97 %. Body mass index is 36.03 kg/m.  EKG: Normal sinus rhythm, rate (unable to obtain).  Indirect Calorimeter completed today shows a VO2 of 195 and a REE of 1339.  Her calculated basal metabolic rate is 7654 thus her basal metabolic rate is worse than expected.  General: Cooperative, alert, well developed, in no acute distress. HEENT: Conjunctivae and lids unremarkable. Cardiovascular: Regular rhythm.  Lungs: Normal work of breathing. Neurologic: No focal deficits.   Lab Results  Component Value Date   CREATININE 0.69 02/20/2022   BUN 14 02/20/2022   NA 142 02/20/2022   K 4.4 02/20/2022   CL 104 02/20/2022   CO2 25 02/20/2022   Lab Results  Component Value Date   ALT 18 05/11/2021   AST 19 05/11/2021   ALKPHOS 65 05/11/2021   BILITOT 0.6 05/11/2021   Lab Results  Component Value Date   HGBA1C 6.0 (H) 03/22/2022   HGBA1C 5.8 (A) 11/16/2021   HGBA1C  5.9 05/11/2021   HGBA1C 5.6 03/18/2020   HGBA1C 6.0 06/17/2019   Lab Results  Component Value Date   INSULIN 9.6 03/22/2022   Lab Results  Component Value Date   TSH 0.889 02/20/2022   Lab Results  Component Value Date   CHOL 133 03/22/2022   HDL 58 03/22/2022   LDLCALC 62 03/22/2022   TRIG 65 03/22/2022   CHOLHDL 2 05/11/2021   Lab Results  Component Value Date   WBC 7.9 05/11/2021   HGB 13.3 05/11/2021   HCT 40.1 05/11/2021   MCV 85.1 05/11/2021   PLT 292.0 05/11/2021   No results found for: "IRON", "TIBC", "FERRITIN"  Attestation Statements:   Reviewed by clinician on day of visit: allergies,  medications, problem list, medical history, surgical history, family history, social history, and previous encounter notes.   Wilhemena Durie, am acting as Location manager for [employer], DO.  I have reviewed the above documentation for accuracy and completeness, and I agree with the above. Jearld Lesch, DO

## 2022-04-05 ENCOUNTER — Ambulatory Visit (INDEPENDENT_AMBULATORY_CARE_PROVIDER_SITE_OTHER): Payer: Medicare Other | Admitting: Bariatrics

## 2022-04-05 ENCOUNTER — Encounter (INDEPENDENT_AMBULATORY_CARE_PROVIDER_SITE_OTHER): Payer: Self-pay | Admitting: Bariatrics

## 2022-04-05 VITALS — BP 137/79 | HR 69 | Temp 97.5°F | Ht 62.0 in | Wt 199.0 lb

## 2022-04-05 DIAGNOSIS — Z7985 Long-term (current) use of injectable non-insulin antidiabetic drugs: Secondary | ICD-10-CM

## 2022-04-05 DIAGNOSIS — E669 Obesity, unspecified: Secondary | ICD-10-CM

## 2022-04-05 DIAGNOSIS — Z6836 Body mass index (BMI) 36.0-36.9, adult: Secondary | ICD-10-CM

## 2022-04-05 DIAGNOSIS — R632 Polyphagia: Secondary | ICD-10-CM

## 2022-04-05 DIAGNOSIS — E1169 Type 2 diabetes mellitus with other specified complication: Secondary | ICD-10-CM | POA: Diagnosis not present

## 2022-04-05 DIAGNOSIS — E7849 Other hyperlipidemia: Secondary | ICD-10-CM

## 2022-04-05 MED ORDER — TRULICITY 0.75 MG/0.5ML ~~LOC~~ SOAJ: 0.7500 mg | SUBCUTANEOUS | 0 refills | Status: DC

## 2022-04-06 ENCOUNTER — Other Ambulatory Visit: Payer: Self-pay

## 2022-04-06 MED ORDER — NEBIVOLOL HCL 2.5 MG PO TABS: 2.5000 mg | ORAL_TABLET | Freq: Every day | ORAL | 3 refills | Status: DC

## 2022-04-10 ENCOUNTER — Ambulatory Visit (INDEPENDENT_AMBULATORY_CARE_PROVIDER_SITE_OTHER): Payer: Medicare Other | Admitting: Bariatrics

## 2022-04-17 ENCOUNTER — Encounter (INDEPENDENT_AMBULATORY_CARE_PROVIDER_SITE_OTHER): Payer: Self-pay | Admitting: Bariatrics

## 2022-04-17 NOTE — Progress Notes (Signed)
Chief Complaint:   OBESITY Suzanne Conner is here to discuss her progress with her obesity treatment plan along with follow-up of her obesity related diagnoses. Suzanne Conner is on the Category 1 Plan and states she is following her eating plan approximately 60% of the time. Suzanne Conner states she is doing 0 minutes 0 times per week.  Today's visit was #: 2 Starting weight: 197 lbs Starting date: 03/22/2022 Today's weight: 199 lbs Today's date: 04/05/2022 Total lbs lost to date: 0 Total lbs lost since last in-office visit: 0  Interim History: Suzanne Conner is up 2 pounds since her first visit.  It has not been easy and she still has cravings and increased appetite.  She is getting adequate water.  Subjective:   1. Diabetes mellitus type 2 in obese [facility]) Suzanne Conner is currently taking Jardiance.  2. Other hyperlipidemia Suzanne Conner is taking Crestor.  3. Suzanne Conner has no contraindications.  Assessment/Plan:   1. Diabetes mellitus type 2 in obese [facility]) Suzanne Conner agreed to start Trulicity 0 5.78 mg once weekly, with no refills.  She will keep her starches and sugar intake low.  - Dulaglutide (TRULICITY) 4.69 GE/9.5MW SOPN; Inject 0.75 mg into the skin once a week.  Dispense: 2 mL; Refill: 0  2. Other hyperlipidemia Suzanne Conner will continue Crestor as directed.  Suzanne Conner agreed to start Trulicity 0 4.13 mg once weekly, with no refills.  4. Obesity, Current BMI 36.4 Suzanne Conner is currently in the action stage of change. As such, her goal is to continue with weight loss efforts. She has agreed to the Category 1 Plan.   Meal planning was discussed.  Review labs with the patient from 03/22/2022, BMP, glucose, and TSH.  She is to keep her water intake high and will use the scale.  Exercise goals: No exercise has been prescribed at this time.  Behavioral modification strategies: increasing lean protein intake, decreasing simple carbohydrates, increasing vegetables, increasing water intake,  decreasing eating out, no skipping meals, meal planning and cooking strategies, keeping healthy foods in the home, and planning for success.  Suzanne Conner has agreed to follow-up with our clinic in 2 to 3 weeks. She was informed of the importance of frequent follow-up visits to maximize her success with intensive lifestyle modifications for her multiple health conditions.   Objective:   Blood pressure 137/79, pulse 69, temperature (!) 97.5 F (36.4 C), height '5\' 2"'$  (1.575 m), weight 199 lb (90.3 kg), SpO2 97 %. Body mass index is 36.4 kg/m.  General: Cooperative, alert, well developed, in no acute distress. HEENT: Conjunctivae and lids unremarkable. Cardiovascular: Regular rhythm.  Lungs: Normal work of breathing. Neurologic: No focal deficits.   Lab Results  Component Value Date   CREATININE 0.69 02/20/2022   BUN 14 02/20/2022   NA 142 02/20/2022   K 4.4 02/20/2022   CL 104 02/20/2022   CO2 25 02/20/2022   Lab Results  Component Value Date   ALT 18 05/11/2021   AST 19 05/11/2021   ALKPHOS 65 05/11/2021   BILITOT 0.6 05/11/2021   Lab Results  Component Value Date   HGBA1C 6.0 (H) 03/22/2022   HGBA1C 5.8 (A) 11/16/2021   HGBA1C 5.9 05/11/2021   HGBA1C 5.6 03/18/2020   HGBA1C 6.0 06/17/2019   Lab Results  Component Value Date   INSULIN 9.6 03/22/2022   Lab Results  Component Value Date   TSH 0.889 02/20/2022   Lab Results  Component Value Date   CHOL 133 03/22/2022   HDL 58 03/22/2022  LDLCALC 62 03/22/2022   TRIG 65 03/22/2022   CHOLHDL 2 05/11/2021   Lab Results  Component Value Date   VD25OH 80.2 03/22/2022   Lab Results  Component Value Date   WBC 7.9 05/11/2021   HGB 13.3 05/11/2021   HCT 40.1 05/11/2021   MCV 85.1 05/11/2021   PLT 292.0 05/11/2021   No results found for: "IRON", "TIBC", "FERRITIN"  Attestation Statements:   Reviewed by clinician on day of visit: allergies, medications, problem list, medical history, surgical history, family  history, social history, and previous encounter notes.  Wilhemena Durie, am acting as Location manager for [employer], DO.  I have reviewed the above documentation for accuracy and completeness, and I agree with the above. Jearld Lesch, DO

## 2022-04-18 ENCOUNTER — Encounter (INDEPENDENT_AMBULATORY_CARE_PROVIDER_SITE_OTHER): Payer: Self-pay

## 2022-04-19 ENCOUNTER — Ambulatory Visit: Payer: Medicare Other

## 2022-04-19 DIAGNOSIS — R5383 Other fatigue: Secondary | ICD-10-CM

## 2022-04-19 DIAGNOSIS — G4719 Other hypersomnia: Secondary | ICD-10-CM

## 2022-04-19 DIAGNOSIS — G4733 Obstructive sleep apnea (adult) (pediatric): Secondary | ICD-10-CM

## 2022-04-19 DIAGNOSIS — L218 Other seborrheic dermatitis: Secondary | ICD-10-CM | POA: Diagnosis not present

## 2022-04-19 DIAGNOSIS — L680 Hirsutism: Secondary | ICD-10-CM | POA: Diagnosis not present

## 2022-04-28 ENCOUNTER — Telehealth: Payer: Self-pay | Admitting: Pulmonary Disease

## 2022-04-28 DIAGNOSIS — G4733 Obstructive sleep apnea (adult) (pediatric): Secondary | ICD-10-CM | POA: Diagnosis not present

## 2022-04-28 NOTE — Telephone Encounter (Signed)
Call patient  Sleep study result  Date of study: 04/19/2022  Impression: Mild obstructive sleep apnea Mild oxygen desaturations  Recommendation: Options of treatment for mild obstructive sleep apnea will include    1.  CPAP therapy if there is significant daytime sleepiness or other comorbidities including history of CVA or cardiac disease  -If CPAP is chosen as an option of treatment auto titrating CPAP with a pressure setting of 5-15 will be appropriate  2.  Watchful waiting with emphasis on weight loss measures, sleep position modification to optimize lateral sleep, elevating the head of the bed by about 30 degrees may also help.  3.  An oral device may be fashioned for the treatment of mild sleep disordered breathing, will involve referral to dentist.  Follow-up as previously scheduled

## 2022-04-30 ENCOUNTER — Other Ambulatory Visit (INDEPENDENT_AMBULATORY_CARE_PROVIDER_SITE_OTHER): Payer: Self-pay | Admitting: Bariatrics

## 2022-04-30 DIAGNOSIS — E1169 Type 2 diabetes mellitus with other specified complication: Secondary | ICD-10-CM

## 2022-04-30 NOTE — Telephone Encounter (Signed)
I called and she wants to talk to the provider before starting the machine and get more detail at the office visit on 05/04/2022. Nothing further needed.

## 2022-05-02 ENCOUNTER — Telehealth: Payer: Self-pay | Admitting: Pulmonary Disease

## 2022-05-02 DIAGNOSIS — G4733 Obstructive sleep apnea (adult) (pediatric): Secondary | ICD-10-CM

## 2022-05-02 NOTE — Telephone Encounter (Signed)
[place] [place] [facility] Telephone Encounter  Called patient and discussed HST 04/19/22 results  HST 04/19/22 - Mild OSA. AHI 6.7. Nadir SpO2 77%. Mean SpO2 92%  Discussion: Reviewed results consistent with OSA. Discussed options including CPAP, oral appliance, weight loss and sleeping on side. After discussion she wished to pursue CPAP  Plan: --ORDER new autoCPAP 5-15 cm H20 and supplies  Rodman Pickle, M.D. [facility] 05/02/2022 10:58 AM

## 2022-05-02 NOTE — Telephone Encounter (Signed)
-----   Message from Donne Hazel sent at 04/30/2022 12:54 PM EDT ----- Hello Dr Loanne Drilling  this patient's HST report is ready for review

## 2022-05-04 ENCOUNTER — Ambulatory Visit (INDEPENDENT_AMBULATORY_CARE_PROVIDER_SITE_OTHER): Payer: Medicare Other | Admitting: Pulmonary Disease

## 2022-05-04 ENCOUNTER — Encounter: Payer: Self-pay | Admitting: Pulmonary Disease

## 2022-05-04 VITALS — BP 110/60 | HR 78 | Ht 65.0 in | Wt 200.8 lb

## 2022-05-04 DIAGNOSIS — G4733 Obstructive sleep apnea (adult) (pediatric): Secondary | ICD-10-CM

## 2022-05-04 DIAGNOSIS — J4550 Severe persistent asthma, uncomplicated: Secondary | ICD-10-CM | POA: Diagnosis not present

## 2022-05-04 DIAGNOSIS — D860 Sarcoidosis of lung: Secondary | ICD-10-CM

## 2022-05-04 DIAGNOSIS — J4520 Mild intermittent asthma, uncomplicated: Secondary | ICD-10-CM | POA: Diagnosis not present

## 2022-05-04 LAB — PULMONARY FUNCTION TEST
DL/VA % pred: 94 %
DL/VA: 3.86 ml/min/mmHg/L
DLCO cor % pred: 58 %
DLCO cor: 11.62 ml/min/mmHg
DLCO unc % pred: 58 %
DLCO unc: 11.62 ml/min/mmHg
FEF 25-75 Post: 1.04 L/sec
FEF 25-75 Pre: 0.66 L/sec
FEF2575-%Change-Post: 57 %
FEF2575-%Pred-Post: 58 %
FEF2575-%Pred-Pre: 37 %
FEV1-%Change-Post: 12 %
FEV1-%Pred-Post: 47 %
FEV1-%Pred-Pre: 41 %
FEV1-Post: 1.06 L
FEV1-Pre: 0.94 L
FEV1FVC-%Change-Post: 5 %
FEV1FVC-%Pred-Pre: 98 %
FEV6-%Change-Post: 6 %
FEV6-%Pred-Post: 48 %
FEV6-%Pred-Pre: 44 %
FEV6-Post: 1.36 L
FEV6-Pre: 1.27 L
FEV6FVC-%Pred-Post: 104 %
FEV6FVC-%Pred-Pre: 104 %
FVC-%Change-Post: 6 %
FVC-%Pred-Post: 45 %
FVC-%Pred-Pre: 42 %
FVC-Post: 1.36 L
FVC-Pre: 1.27 L
Post FEV1/FVC ratio: 78 %
Post FEV6/FVC ratio: 100 %
Pre FEV1/FVC ratio: 74 %
Pre FEV6/FVC Ratio: 100 %
RV % pred: 76 %
RV: 1.78 L
TLC % pred: 63 %
TLC: 3.29 L

## 2022-05-04 NOTE — Progress Notes (Signed)
PFT done today. 

## 2022-05-04 NOTE — Progress Notes (Signed)
Subjective:   PATIENT ID: Suzanne Conner GENDER: female DOB: 1946/03/27, MRN: [id number]   HPI  Chief Complaint  Patient presents with   Follow-up    PFT results Wexella didn't work for her    Reason for Visit: Follow-up  Ms. Suzanne Conner is a 76 year old female never smoker with childhood asthma, diabetes (diet controlled), hypertension and GERD who presents for follow-up  Synopsis:  She was previously seen by a Pulmonary doctor in [place] 22 years ago for sarcoid diagnosed via bronchoscopy. She is unable to recall any prolonged treatment. Has not had any respiratory issues since then. Was only seen for ~1 year and reports CXR improved during that time which is suggestive Lofgren syndrome. Denies skin involvement except for seborrheic keratosis. Sees an ophthalmologist for high optic pressures.   On initial consultation had shortness of breath and wheezing x 2 months. She lives in a two story home and now having difficulty walking up.   2022 - Improved symptoms on medium dose Advair however still not much stamina since the pandemic. Completed Pulm Rehab 2023 - Persistent shortness of breath. Reported frequent nighttime awakenings and am headaches. Dx with mild OSA and started on CPAP  05/04/22 Since our last visit she tried Wixela 500 for a few weeks but it caused headaches. She switched back to Advair 230 HFA. She uses albuterol inhaler 4 times a week. Has shortness of breath with activity. Continues to have wheezing at night. No coughing.  Asthma Control Test ACT Total Score  02/22/2022 10:40 AM 17  01/04/2021  2:14 PM 16   Review of Systems  Constitutional:  Negative for chills, diaphoresis, fever, malaise/fatigue and weight loss.  HENT:  Negative for congestion.   Respiratory:  Positive for shortness of breath and wheezing. Negative for cough, hemoptysis and sputum production.   Cardiovascular:  Negative for chest pain, palpitations and leg swelling.  Neurological:   Positive for headaches.   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   Penicillins Hives and Other (See Comments)    PATIENT HAS HAD A PCN REACTION WITH IMMEDIATE RASH, FACIAL/TONGUE/THROAT SWELLING, SOB, OR LIGHTHEADEDNESS WITH HYPOTENSION:  #  #  YES  #  #  INCLUDING HIVES AS A CHILD Has patient had a PCN reaction causing severe rash involving mucus membranes or skin necrosis: No Has patient had a PCN reaction that required hospitalization: No Has patient had a PCN reaction occurring within the last 10 years: No If all of the above answers are "NO", then may proceed with Cephalosporin use. PATIENT HAS HAD A PCN REACTION WITH IMMEDIATE RASH, FACIAL/TONGUE/THROAT SWELLING, SOB, OR LIGHTHEADEDNESS WITH HYPOTENSION:  #  #  YES  #  #  INCLUDING HIVES AS A CHILD Has patient had a PCN reaction causing severe rash involving mucus membranes or skin necrosis:  No Has patient had a PCN reaction that required hospitalization: No Has patient had a PCN reaction occurring within the last 10 years: No If all of the above answers are "NO", then may proceed with Cephalosporin use.    Atorvastatin     Joint pain on '40mg'$    Simvastatin     Joint pain on '20mg'$  daily     Outpatient Medications Prior to Visit  Medication Sig Dispense Refill   albuterol (VENTOLIN HFA) 108 (90 Base) MCG/ACT inhaler Inhale 2 puffs into the lungs every 6 (six) hours as  needed for wheezing or shortness of breath. 3 each 2   aspirin EC 81 MG tablet Take 81 mg by mouth daily.     Cholecalciferol (VITAMIN D) 2000 units CAPS Take 2,000 Units by mouth daily.     clindamycin (CLEOCIN) 300 MG capsule Take 300 mg by mouth 2 (two) times daily. Prior to dental appointments     Clobetasol Propionate 0.05 % shampoo Use a small amount on the affected areas twice a day as needed. 118 mL 0   Dulaglutide (TRULICITY) 9.32 TF/5.7DU SOPN Inject 0.75 mg into the skin once a week. 2 mL 0   empagliflozin (JARDIANCE) 10 MG TABS tablet Take 1 tablet (10 mg total) by mouth daily before breakfast. 90 tablet 3   fluticasone-salmeterol (ADVAIR DISKUS) 500-50 MCG/ACT AEPB Inhale 1 puff into the lungs in the morning and at bedtime. 180 each 0   fluticasone-salmeterol (WIXELA INHUB) 500-50 MCG/ACT AEPB Inhale 1 puff into the lungs in the morning and at bedtime.     furosemide (LASIX) 20 MG tablet Take 1 tablet (20 mg total) by mouth 2 (two) times daily. 180 tablet 3   hydrocortisone valerate cream (WESTCORT) 0.2 % Apply 1 application topically 2 (two) times daily. 45 g 0   latanoprost (XALATAN) 0.005 % ophthalmic solution Place 1 drop into both eyes at bedtime.     Melatonin 10 MG TABS Take 1 tablet by mouth at bedtime as needed.     Multiple Vitamins-Minerals (CENTRUM SILVER PO) Take 1 tablet by mouth daily.      nebivolol (BYSTOLIC) 2.5 MG tablet Take 1 tablet (2.5 mg total) by mouth daily. 90 tablet 3   omeprazole (PRILOSEC) 40 MG capsule TAKE 1 CAPSULE TWICE A DAY 180 capsule 3   OPTIVE 0.5-0.9 % ophthalmic solution Place 1 drop into both eyes 2 (two) times daily.     Probiotic Product (TRUNATURE DIGESTIVE PROBIOTIC) CAPS Take 1 capsule by mouth daily.      RESTASIS 0.05 % ophthalmic emulsion Place 1 drop into both eyes in the morning and at bedtime.     rosuvastatin (CRESTOR) 5 MG tablet Take 1 tablet (5 mg total) by mouth daily. 90 tablet 3   telmisartan (MICARDIS) 40 MG tablet Take 1  tablet (40 mg total) by mouth daily. 90 tablet 3   No facility-administered medications prior to visit.    Objective:   Vitals:   05/04/22 0948  BP: 110/60  Pulse: 78  SpO2: 98%  Weight: 200 lb 12.8 oz (91.1 kg)  Height: '5\' 5"'$  (1.651 m)   SpO2: 98 % O2 Device: None (Room air)  Physical Exam: General: Well-appearing, no acute distress HENT: [place], AT Eyes: EOMI, no scleral icterus Respiratory: Clear to auscultation bilaterally.  No crackles, wheezing or rales Cardiovascular: RRR, -M/R/G, no JVD Extremities:-Edema,-tenderness Neuro: AAO x4, CNII-XII grossly intact Psych: Normal mood, normal affect  Data Reviewed:  Imaging: CXR 07/12/2020-no infiltrate, effusion or edema.  CT Coronary 03/20/22 - Visualized lung parenchyma with no pulmonary nodules, masses, infiltrate, effusion or pneumothorax. Large hiatal hernia  PFT: 01/04/21 FVC 1.49 (62%) FEV1 1.10 (59%) Ratio 73  TLC 63% DLCO 62% Interpretation: Mixed obstructive and restrictive defect with mildly reduced DLCO. No significant bronchodilator response however does not preclude benefit of bronchodilators  05/04/22 FVC 1.36(45%) FEV1 1.06(45%) Ratio 74  TLC 63% DLCO 58% Interpretation: Mixed obstructive and restrictive defect with mildly reduced DLCO. Significant FEV1 bronchodilator response. Compared to 2022, reduced FEV1.  Labs: CBC    Component Value Date/Time   WBC 7.9 05/11/2021 1019   RBC 4.71 05/11/2021 1019   HGB 13.3 05/11/2021 1019   HCT 40.1 05/11/2021 1019   PLT 292.0 05/11/2021 1019   MCV 85.1 05/11/2021 1019   MCH 27.7 03/18/2020 1135   MCHC 33.2 05/11/2021 1019   RDW 14.3 05/11/2021 1019   LYMPHSABS 2.9 05/11/2021 1019   MONOABS 0.7 05/11/2021 1019   EOSABS 0.1 05/11/2021 1019   BASOSABS 0.0 05/11/2021 1019   Absolute eos  03/18/20-70 05/11/21 - 100  Sleep: HST 04/19/22 - Mild OSA. AHI 6.7. Nadir SpO2 77%. Mean SpO2 92%    Assessment & Plan:   Discussion: 76 year old female never smoker with  severe asthma, pulmonary sarcoid/Lofgren, OSA, HTN, DM2 who presents for follow-up. PFTs reviewed with worsening mixed defect severity from moderately severe to severe. Persistent asthma symptoms not controlled on current meds. Increase back up ICS to 500 mcg. Differential includes untreated OSA and possible sarcoid flare. We discussed the pros and cons of prolonged prednisone therapy and we agreed that we will optimize asthma and trial CPAP before working up active sarcoid (echo/PET etc).  Mild OSA Epworth 15 - ORDER new autoCPAP 5-15 cm H20 and supplies  Severe persistent asthma - ongoing symptoms - STOP Advair 230-21 mcg  - RESTART Wixela Diskus 500-50 ONE puff TWICE a day - CONTINUE Albuterol TWO puffs as needed for shortness of breath or wheezing - If symptoms persistent, consider PET to rule out active sarcoid   Asthma action plan Increase Advair 2 puffs three times a day for worsening shortness of breath, wheezing and cough. If you symptoms do not improve in 24-48 hours, please our office for evaluation and/or prednisone taper.   Pulmonary sarcoidosis - Dx in 1999 via endobronchial bx. No records available - Hx of resolution in <1 year suggestive of Lofgren - No indication for prednisone therapy for now - Annual PFTs. Due 04/2023 - Recommend annual ophthalmology exam. Last visit on Sept 2022. Currently asymptomatic - Reviewed EKG. Prolonged P-R interval. Due at next visit. - Plan for repeat PFTs in 6-12 months pending clinical improvement (04/2023)   Health Maintenance Immunization History  Administered Date(s) Administered   Fluad Quad(high Dose 65+) 06/22/2020   Influenza, High Dose Seasonal PF 05/12/2019, 06/10/2021   Influenza-Unspecified 05/29/2018, 05/12/2019   PFIZER(Purple Top)SARS-COV-2 Vaccination 10/01/2019, 10/22/2019, 06/17/2020, 12/08/2020   Pneumococcal Polysaccharide-23 06/13/2018   Zoster Recombinat (Shingrix) 03/10/2019, 05/12/2019   Zoster, Live 04/27/2008    CT Lung Screen - not indicated  Orders Placed This Encounter  Procedures   Ambulatory Referral for DME    Referral Priority:   Routine    Referral Type:   Durable Medical Equipment Purchase    Number of Visits Requested:   1   No orders of the defined types were placed in this encounter.   Return in about 3 months (around 08/04/2022).  I have spent a total time of 36-minutes on  the day of the appointment including chart review, data review, collecting history, coordinating care and discussing medical diagnosis and plan with the patient/family. Past medical history, allergies, medications were reviewed. Pertinent imaging, labs and tests included in this note have been reviewed and interpreted independently by me.  [place], MD [facility] [facility] 05/04/2022  Office Number [phone]

## 2022-05-04 NOTE — Patient Instructions (Addendum)
Mild OSA Epworth 15 - ORDER new autoCPAP 5-15 cm H20 and supplies  Severe persistent asthma - ongoing symptoms - STOP Advair 230-21 mcg  - RESTART Wixela Diskus 500-50 ONE puff TWICE a day - CONTINUE Albuterol TWO puffs as needed for shortness of breath or wheezing  Follow-up with me in 3 months

## 2022-05-07 ENCOUNTER — Ambulatory Visit (INDEPENDENT_AMBULATORY_CARE_PROVIDER_SITE_OTHER): Payer: Medicare Other | Admitting: Bariatrics

## 2022-05-07 ENCOUNTER — Encounter: Payer: Self-pay | Admitting: Pulmonary Disease

## 2022-05-07 ENCOUNTER — Encounter (INDEPENDENT_AMBULATORY_CARE_PROVIDER_SITE_OTHER): Payer: Self-pay | Admitting: Bariatrics

## 2022-05-07 VITALS — BP 135/71 | HR 70 | Temp 97.7°F | Ht 62.0 in | Wt 197.0 lb

## 2022-05-07 DIAGNOSIS — Z6836 Body mass index (BMI) 36.0-36.9, adult: Secondary | ICD-10-CM | POA: Diagnosis not present

## 2022-05-07 DIAGNOSIS — E1169 Type 2 diabetes mellitus with other specified complication: Secondary | ICD-10-CM | POA: Diagnosis not present

## 2022-05-07 DIAGNOSIS — Z7985 Long-term (current) use of injectable non-insulin antidiabetic drugs: Secondary | ICD-10-CM

## 2022-05-07 DIAGNOSIS — I10 Essential (primary) hypertension: Secondary | ICD-10-CM

## 2022-05-07 DIAGNOSIS — E669 Obesity, unspecified: Secondary | ICD-10-CM

## 2022-05-07 DIAGNOSIS — Z7984 Long term (current) use of oral hypoglycemic drugs: Secondary | ICD-10-CM

## 2022-05-07 MED ORDER — TRULICITY 1.5 MG/0.5ML ~~LOC~~ SOAJ: 1.5000 mg | SUBCUTANEOUS | 0 refills | Status: DC

## 2022-05-07 NOTE — Telephone Encounter (Signed)
Dr. Loanne Drilling, please advise on pt's message. Thanks.

## 2022-05-10 ENCOUNTER — Encounter (INDEPENDENT_AMBULATORY_CARE_PROVIDER_SITE_OTHER): Payer: Self-pay | Admitting: Bariatrics

## 2022-05-10 NOTE — Progress Notes (Signed)
Chief Complaint:   OBESITY Suzanne Conner is here to discuss her progress with her obesity treatment plan along with follow-up of her obesity related diagnoses. Suzanne Conner is on the Category 1 Plan and states she is following her eating plan approximately 50% of the time. Suzanne Conner states she is walking for 30 minutes 3 times per week.  Today's visit was #: 3 Starting weight: 197 lbs Starting date: 03/22/2022 Today's weight: 197 lbs Today's date: 05/07/22 Total lbs lost to date: 0 Total lbs lost since last in-office visit: -2  Interim History: She is down 2 pounds since her last visit.  Subjective:   1. Diabetes mellitus type 2 in obese [facility]) Taking Trulicity and Jardiance.  No nausea vomiting.  2. Essential hypertension Controlled.  Assessment/Plan:   1. Diabetes mellitus type 2 in obese (HCC) Increase dose of Trulicity to 5.62 mg weekly. - Dulaglutide (TRULICITY) 1.5 ZH/0.8MV SOPN; Inject 1.5 mg into the skin once a week.  Dispense: 2 mL; Refill: 0  2. Essential hypertension 1. Continue medications 2.  No added salt.  3. Obesity, Current BMI 36.2 1.  Will adhere to the plan 80 to 90%. 2.  Meal planning 3.  Increase vegetables. 4.  Recipes.  Suzanne Conner is currently in the action stage of change. As such, her goal is to continue with weight loss efforts. She has agreed to the Category 1 Plan.   Exercise goals: Will continue to walk.  Behavioral modification strategies: increasing lean protein intake, decreasing simple carbohydrates, increasing vegetables, increasing water intake, decreasing eating out, no skipping meals, meal planning and cooking strategies, keeping healthy foods in the home, and planning for success.  Suzanne Conner has agreed to follow-up with our clinic in 3-4 weeks. She was informed of the importance of frequent follow-up visits to maximize her success with intensive lifestyle modifications for her multiple health conditions.   Objective:   Blood pressure 135/71,  pulse 70, temperature 97.7 F (36.5 C), height '5\' 2"'$  (1.575 m), weight 197 lb (89.4 kg), SpO2 96 %. Body mass index is 36.03 kg/m.  General: Cooperative, alert, well developed, in no acute distress. HEENT: Conjunctivae and lids unremarkable. Cardiovascular: Regular rhythm.  Lungs: Normal work of breathing. Neurologic: No focal deficits.   Lab Results  Component Value Date   CREATININE 0.69 02/20/2022   BUN 14 02/20/2022   NA 142 02/20/2022   K 4.4 02/20/2022   CL 104 02/20/2022   CO2 25 02/20/2022   Lab Results  Component Value Date   ALT 18 05/11/2021   AST 19 05/11/2021   ALKPHOS 65 05/11/2021   BILITOT 0.6 05/11/2021   Lab Results  Component Value Date   HGBA1C 6.0 (H) 03/22/2022   HGBA1C 5.8 (A) 11/16/2021   HGBA1C 5.9 05/11/2021   HGBA1C 5.6 03/18/2020   HGBA1C 6.0 06/17/2019   Lab Results  Component Value Date   INSULIN 9.6 03/22/2022   Lab Results  Component Value Date   TSH 0.889 02/20/2022   Lab Results  Component Value Date   CHOL 133 03/22/2022   HDL 58 03/22/2022   LDLCALC 62 03/22/2022   TRIG 65 03/22/2022   CHOLHDL 2 05/11/2021   Lab Results  Component Value Date   VD25OH 80.2 03/22/2022   Lab Results  Component Value Date   WBC 7.9 05/11/2021   HGB 13.3 05/11/2021   HCT 40.1 05/11/2021   MCV 85.1 05/11/2021   PLT 292.0 05/11/2021   No results found for: "IRON", "TIBC", "FERRITIN"  Attestation  Statements:   Reviewed by clinician on day of visit: allergies, medications, problem list, medical history, surgical history, family history, social history, and previous encounter notes.  I, Dawn Whitmire, FNP-C, am acting as transcriptionist for Dr. Jearld Lesch.  I have reviewed the above documentation for accuracy and completeness, and I agree with the above. Jearld Lesch, DO

## 2022-05-17 ENCOUNTER — Telehealth: Payer: Self-pay

## 2022-05-17 VITALS — Ht 64.0 in | Wt 197.0 lb

## 2022-05-17 DIAGNOSIS — I2510 Atherosclerotic heart disease of native coronary artery without angina pectoris: Secondary | ICD-10-CM | POA: Insufficient documentation

## 2022-05-17 NOTE — Telephone Encounter (Signed)
Contacted patient on preferred number listed in notes for scheduled AWV. Patient refused to complete this encounter stated she felt uncomfortable and will gather further information and clarity from PCP Dr Volanda Napoleon.

## 2022-05-17 NOTE — Progress Notes (Signed)
Subjective:   Suzanne Conner is a 76 y.o. female who presents for Medicare Annual (Subsequent) preventive examination.  Review of Systems    Virtual Visit via Telephone Note  I connected with  Suzanne Conner on 05/17/22 at  2:30 PM EDT by telephone and verified that I am speaking with the correct person using two identifiers.  Location: Patient: Home Provider: Office Persons participating in the virtual visit: patient/Nurse Health Advisor   I discussed the limitations, risks, security and privacy concerns of performing an evaluation and management service by telephone and the availability of in person appointments. The patient expressed understanding and agreed to proceed.  Interactive audio and video telecommunications were attempted between this nurse and patient, however failed, due to patient having technical difficulties OR patient did not have access to video capability.  We continued and completed visit with audio only.  Some vital signs may be absent or patient reported.   Criselda Peaches, LPN        Objective:    There were no vitals filed for this visit. There is no height or weight on file to calculate BMI.     03/18/2020    6:43 PM 06/12/2018    3:18 PM 05/30/2018    8:15 AM  Advanced Directives  Does Patient Have a Medical Advance Directive? No Yes Yes  Type of Advance Directive  Healthcare Power of [place]  Does patient want to make changes to medical advance directive?  No - Patient declined   Copy of [place] in Chart?  No - copy requested No - copy requested    Current Medications (verified) Outpatient Encounter Medications as of 05/17/2022  Medication Sig   albuterol (VENTOLIN HFA) 108 (90 Base) MCG/ACT inhaler Inhale 2 puffs into the lungs every 6 (six) hours as needed for wheezing or shortness of breath.   aspirin EC 81 MG tablet Take 81 mg by mouth daily.   Cholecalciferol (VITAMIN D) 2000 units CAPS  Take 2,000 Units by mouth daily.   clindamycin (CLEOCIN) 300 MG capsule Take 300 mg by mouth 2 (two) times daily. Prior to dental appointments   Clobetasol Propionate 0.05 % shampoo Use a small amount on the affected areas twice a day as needed.   Dulaglutide (TRULICITY) 1.5 ZW/2.5EN SOPN Inject 1.5 mg into the skin once a week.   empagliflozin (JARDIANCE) 10 MG TABS tablet Take 1 tablet (10 mg total) by mouth daily before breakfast.   fluticasone-salmeterol (ADVAIR DISKUS) 500-50 MCG/ACT AEPB Inhale 1 puff into the lungs in the morning and at bedtime.   fluticasone-salmeterol (WIXELA INHUB) 500-50 MCG/ACT AEPB Inhale 1 puff into the lungs in the morning and at bedtime.   furosemide (LASIX) 20 MG tablet Take 1 tablet (20 mg total) by mouth 2 (two) times daily.   hydrocortisone valerate cream (WESTCORT) 0.2 % Apply 1 application topically 2 (two) times daily.   latanoprost (XALATAN) 0.005 % ophthalmic solution Place 1 drop into both eyes at bedtime.   Melatonin 10 MG TABS Take 1 tablet by mouth at bedtime as needed.   Multiple Vitamins-Minerals (CENTRUM SILVER PO) Take 1 tablet by mouth daily.    nebivolol (BYSTOLIC) 2.5 MG tablet Take 1 tablet (2.5 mg total) by mouth daily.   omeprazole (PRILOSEC) 40 MG capsule TAKE 1 CAPSULE TWICE A DAY   OPTIVE 0.5-0.9 % ophthalmic solution Place 1 drop into both eyes 2 (two) times daily.   Probiotic Product (TRUNATURE DIGESTIVE  PROBIOTIC) CAPS Take 1 capsule by mouth daily.    RESTASIS 0.05 % ophthalmic emulsion Place 1 drop into both eyes in the morning and at bedtime.   rosuvastatin (CRESTOR) 5 MG tablet Take 1 tablet (5 mg total) by mouth daily.   telmisartan (MICARDIS) 40 MG tablet Take 1 tablet (40 mg total) by mouth daily.   No facility-administered encounter medications on file as of 05/17/2022.    Allergies (verified) Paxil [paroxetine], Penicillins, Atorvastatin, and Simvastatin   History: Past Medical History:  Diagnosis Date   Arthritis     "left hip; right wrist" (06/12/2018)   Asthma    Depression    Family history of polyps in the colon    GERD (gastroesophageal reflux disease)    Glaucoma    Hypertension    Paroxysmal SVT (supraventricular tachycardia) ([place]) 03/23/2022   2 Supraventricular Tachycardia runs occurred, the run with the fastest interval lasting 19 beats with a max rate of 218 bpm    Type 2 diabetes, diet controlled ([place])    Past Surgical History:  Procedure Laterality Date   BUNIONECTOMY Right    CATARACT EXTRACTION W/ INTRAOCULAR LENS IMPLANT Right    JOINT REPLACEMENT     REPLACEMENT TOTAL KNEE Right 2015   REVERSE SHOULDER ARTHROPLASTY Left 06/12/2018   REVERSE SHOULDER ARTHROPLASTY Left 06/12/2018   Procedure: LEFT REVERSE SHOULDER ARTHROPLASTY;  Surgeon: Justice Britain, MD;  Location: [facility];  Service: Orthopedics;  Laterality: Left;   SHOULDER ARTHROSCOPY W/ ROTATOR CUFF REPAIR Right 2015   TUBAL LIGATION     Family History  Problem Relation Age of Onset   Asthma Brother    Diabetes Brother    Dementia Mother    Cancer Father    Heart disease Sister    Diabetes Brother    Diabetes Brother    Healthy Son    Healthy Daughter    Social History   Socioeconomic History   Marital status: Married    Spouse name: Not on file   Number of children: Not on file   Years of education: Not on file   Highest education level: Not on file  Occupational History   Not on file  Tobacco Use   Smoking status: Never   Smokeless tobacco: Never  Vaping Use   Vaping Use: Never used  Substance and Sexual Activity   Alcohol use: Yes    Alcohol/week: 5.0 standard drinks of alcohol    Types: 5 Glasses of wine per week   Drug use: Never    Comment: 06/12/2018 CBD oil for pain   Sexual activity: Yes  Other Topics Concern   Not on file  Social History Narrative   Not on file   Social Determinants of Health   Financial Resource Strain: Not on file  Food Insecurity: Not on file  Transportation Needs: Not  on file  Physical Activity: Not on file  Stress: Not on file  Social Connections: Not on file    Tobacco Counseling Counseling given: Not Answered   Clinical Intake:                 Diabetic?  No         Activities of Daily Living     No data to display          Patient Care Team: Billie Ruddy, MD as PCP - General (Family Medicine) Early Osmond, MD as PCP - Cardiology (Cardiology) Paralee Cancel, MD as Consulting Physician (Orthopedic Surgery) Gonzales, Jone Baseman  Caffie Pinto, MD as Referring Physician (Ophthalmology) Hortencia Pilar, MD as Consulting Physician (Surgery)  Indicate any recent Medical Services you may have received from other than [facility] providers in the past year (date may be approximate).     Assessment:   This is a routine wellness examination for Suzanne Conner.  Hearing/Vision screen No results found.  Dietary issues and exercise activities discussed:     Goals Addressed   None    Depression Screen    02/07/2022   10:24 AM 12/06/2021    9:14 AM 11/16/2021   10:24 AM 08/18/2021    9:37 AM 08/01/2021    5:15 PM 05/09/2021    1:59 PM 03/03/2021    2:09 PM  PHQ 2/9 Scores  PHQ - 2 Score 0 0 0 0 '1 1 1  '$ PHQ- 9 Score '2 2 1  5 5     '$ Fall Risk    12/06/2021    9:13 AM 11/16/2021   10:24 AM 08/18/2021    9:37 AM 05/11/2021    9:01 AM  Fall Risk   Falls in the past year? 0 0 0 0  Number falls in past yr: 0     Injury with Fall? 0 0    Risk for fall due to : No Fall Risks     Follow up Falls evaluation completed       FALL RISK PREVENTION PERTAINING TO THE HOME:  Any stairs in or around the home?  If so, are there any without handrails? Home free of loose throw rugs in walkways, pet beds, electrical cords, etc?  Adequate lighting in your home to reduce risk of falls?   ASSISTIVE DEVICES UTILIZED TO PREVENT FALLS:  Life alert?  Use of a cane, walker or w/c?  Grab bars in the bathroom?  Shower chair or bench in shower?  Elevated  toilet seat or a handicapped toilet?   TIMED UP AND GO:  Was the test performed? No . Audio Visit   Cognitive Function:        Immunizations Immunization History  Administered Date(s) Administered   Fluad Quad(high Dose 65+) 06/22/2020   Influenza, High Dose Seasonal PF 05/12/2019, 06/10/2021   Influenza-Unspecified 05/29/2018, 05/12/2019   PFIZER(Purple Top)SARS-COV-2 Vaccination 10/01/2019, 10/22/2019, 06/17/2020, 12/08/2020   Pneumococcal Polysaccharide-23 06/13/2018   Zoster Recombinat (Shingrix) 03/10/2019, 05/12/2019   Zoster, Live 04/27/2008            Qualifies for Shingles Vaccine?  Zostavax completed     Screening Tests Health Maintenance  Topic Date Due   FOOT EXAM  Never done   TETANUS/TDAP  Never done   MAMMOGRAM  Never done   DEXA SCAN  Never done   Pneumonia Vaccine 60+ Years old (2 - PCV) 06/14/2019   COVID-19 Vaccine (5 - Pfizer risk series) 02/02/2021   OPHTHALMOLOGY EXAM  02/13/2022   INFLUENZA VACCINE  04/10/2022   Hepatitis C Screening  02/08/2023 (Originally 07/26/1964)   HEMOGLOBIN A1C  09/22/2022   COLONOSCOPY (Pts 45-58yr Insurance coverage will need to be confirmed)  11/08/2027   Zoster Vaccines- Shingrix  Completed   HPV VACCINES  Aged Out    Health Maintenance  Health Maintenance Due  Topic Date Due   FOOT EXAM  Never done   TETANUS/TDAP  Never done   MAMMOGRAM  Never done   DEXA SCAN  Never done   Pneumonia Vaccine 75 Years old (2 - PCV) 06/14/2019   COVID-19 Vaccine (5 - Pfizer risk series) 02/02/2021   OPHTHALMOLOGY  EXAM  02/13/2022   INFLUENZA VACCINE  04/10/2022          Lung Cancer Screening: (Low Dose CT Chest recommended if Age 62-80 years, 30 pack-year currently smoking OR have quit w/in 15years.) qualify.   Lung Cancer Screening Referral:   Additional Screening:  Hepatitis C Screening:  qualify; Completed   Vision Screening: Recommended annual ophthalmology exams for early detection of glaucoma  and other disorders of the eye. Is the patient up to date with their annual eye exam?  Yes  Who is the provider or what is the name of the office in which the patient attends annual eye exams?  If pt is not established with a provider, would they like to be referred to a provider to establish care? .   Dental Screening: Recommended annual dental exams for proper oral hygiene  Community Resource Referral / Chronic Care Management: CRR required this visit?    CCM required this visit?       Plan:     I have personally reviewed and noted the following in the patient's chart:   Medical and social history Use of alcohol, tobacco or illicit drugs  Current medications and supplements including opioid prescriptions.  Functional ability and status Nutritional status Physical activity Advanced directives List of other physicians Hospitalizations, surgeries, and ER visits in previous 12 months Vitals Screenings to include cognitive, depression, and falls Referrals and appointments  In addition, I have reviewed and discussed with patient certain preventive protocols, quality metrics, and best practice recommendations. A written personalized care plan for preventive services as well as general preventive health recommendations were provided to patient.     Criselda Peaches, LPN   6/0/0459   Nurse Notes: Patient refused to complete this encounter stated she felt uncomfortable and will gather further information and clarity from PCP Dr Volanda Napoleon.      This encounter was created in error - please disregard.

## 2022-05-17 NOTE — Progress Notes (Addendum)
Cardiology Office Note:    Date:  05/18/2022   ID:  [name], DOB 1946-06-11, MRN [id number]  PCP:  Billie Ruddy, MD  [facility] Providers Cardiologist:  Early Osmond, MD    Referring MD: Billie Ruddy, MD   Chief Complaint:  Follow-up for CAD, shortness of breath    Patient Profile: Coronary artery disease  Mild non-obs CAD on CCTA in 7.2023 (pLAD 25-49, LM and pRCA 0-24) 7.2023 CAC score 14 (70th percentile) Hypertension  Hyperlipidemia  Intol to Atorva 2/2 myalgias  Diabetes mellitus  Obesity   Asthma  GERD  Depression  Glaucoma   Prior CV Studies: ECHO COMPLETE WO IMAGING ENHANCING AGENT 08/16/2021 EF 60-65, no RWMA, GR 1 DD, normal RVSF, trivial MR, trivial AI, AV sclerosis without stenosis   LONG TERM MONITOR (8-14 DAYS) INTERPRETATION 03/21/2022 NSR, average heart rate 85; 2 SVT runs (19 beats); no A-fib, sustained arrhythmias or bradycardia arrhythmias   CT CORONARY MORPH W/CTA COR W/SCORE W/CA W/CM &/OR WO/CM 03/20/2022 IMPRESSION: 1. Coronary calcium score of 89. This was 70th percentile for age and sex matched control. 2.  Normal coronary origin with right dominance. 3.  Nonobstructive CAD 4.  Mixed plaque in proximal LAD causes mild (25-49%) stenosis 5. Noncalcified plaque in left main and proximal RCA causes minimal (0-24%) stenosis  IMPRESSION: 1. Large hiatal hernia.    History of Present Illness:   Suzanne Conner is a 76 y.o. female with the above problem list.  She was last seen in clinic by Ermalinda Barrios, PA-C 02/20/22 for evaluation of  exertional shortness of breath and rapid HR. Event monitor showed some brief episodes of Supraventricular Tachycardia. She was placed on Bystolic to see if this will help her symptoms. CCTA demonstrated mild non-obstructive CAD and a hiatal hernia. She returns for f/u.  She is here alone.  She still has shortness of breath with some activities.  She has been seeing a pulmonologist and is  on inhaled medications.  This is helped her breathing some.  She is using her rescue inhaler less.  She sleeps on an incline chronically.  She has not had significant leg edema.  She has not had syncope, chest pain.  She still notes elevations in her heart rate from time to time.     Past Medical History:  Diagnosis Date   Arthritis    "left hip; right wrist" (06/12/2018)   Asthma    Depression    Family history of polyps in the colon    GERD (gastroesophageal reflux disease)    Glaucoma    Hypertension    Paroxysmal SVT (supraventricular tachycardia) ([place]) 03/23/2022   2 Supraventricular Tachycardia runs occurred, the run with the fastest interval lasting 19 beats with a max rate of 218 bpm    Type 2 diabetes, diet controlled (HCC)    Current Medications: Current Meds  Medication Sig   albuterol (VENTOLIN HFA) 108 (90 Base) MCG/ACT inhaler Inhale 2 puffs into the lungs every 6 (six) hours as needed for wheezing or shortness of breath.   aspirin EC 81 MG tablet Take 81 mg by mouth daily.   Cholecalciferol (VITAMIN D) 2000 units CAPS Take 2,000 Units by mouth daily.   clindamycin (CLEOCIN) 300 MG capsule Take 300 mg by mouth 2 (two) times daily. Prior to dental appointments   Clobetasol Propionate 0.05 % shampoo Use a small amount on the affected areas twice a day as needed.   Dulaglutide (TRULICITY) 1.5 SF/6.8LE  SOPN Inject 1.5 mg into the skin once a week.   empagliflozin (JARDIANCE) 10 MG TABS tablet Take 1 tablet (10 mg total) by mouth daily before breakfast.   fluticasone-salmeterol (ADVAIR DISKUS) 500-50 MCG/ACT AEPB Inhale 1 puff into the lungs in the morning and at bedtime.   fluticasone-salmeterol (WIXELA INHUB) 500-50 MCG/ACT AEPB Inhale 1 puff into the lungs in the morning and at bedtime.   furosemide (LASIX) 20 MG tablet Take 1 tablet (20 mg total) by mouth 2 (two) times daily.   hydrocortisone valerate cream (WESTCORT) 0.2 % Apply 1 application topically 2 (two) times daily.    latanoprost (XALATAN) 0.005 % ophthalmic solution Place 1 drop into both eyes at bedtime.   Melatonin 10 MG TABS Take 1 tablet by mouth at bedtime as needed.   Multiple Vitamins-Minerals (CENTRUM SILVER PO) Take 1 tablet by mouth daily.    nebivolol (BYSTOLIC) 5 MG tablet Take 1 tablet (5 mg total) by mouth daily.   omeprazole (PRILOSEC) 40 MG capsule TAKE 1 CAPSULE TWICE A DAY   OPTIVE 0.5-0.9 % ophthalmic solution Place 1 drop into both eyes 2 (two) times daily.   Probiotic Product (TRUNATURE DIGESTIVE PROBIOTIC) CAPS Take 1 capsule by mouth daily.    RESTASIS 0.05 % ophthalmic emulsion Place 1 drop into both eyes in the morning and at bedtime.   rosuvastatin (CRESTOR) 5 MG tablet Take 1 tablet (5 mg total) by mouth daily.   telmisartan (MICARDIS) 40 MG tablet Take 1 tablet (40 mg total) by mouth daily.   [DISCONTINUED] nebivolol (BYSTOLIC) 2.5 MG tablet Take 1 tablet (2.5 mg total) by mouth daily.    Allergies:   Paxil [paroxetine], Penicillins, Atorvastatin, and Simvastatin   Social History   Tobacco Use   Smoking status: Never   Smokeless tobacco: Never  Vaping Use   Vaping Use: Never used  Substance Use Topics   Alcohol use: Yes    Alcohol/week: 5.0 standard drinks of alcohol    Types: 5 Glasses of wine per week   Drug use: Never    Comment: 06/12/2018 CBD oil for pain    Family Hx: The patient's family history includes Asthma in her brother; Cancer in her father; Dementia in her mother; Diabetes in her brother, brother, and brother; Healthy in her daughter and son; Heart disease in her sister.  Review of Systems  Gastrointestinal:  Negative for hematochezia.  Genitourinary:  Negative for hematuria.     EKGs/Labs/Other Test Reviewed:    EKG:  EKG is  ordered today.  The ekg ordered today demonstrates NSR, HR 78, normal axis, no ST-T wave changes, QTc 419  Recent Labs: 02/20/2022: BUN 14; Creatinine, Ser 0.69; Potassium 4.4; Sodium 142; TSH 0.889   Recent Lipid  Panel Recent Labs    03/22/22 1020  CHOL 133  TRIG 65  HDL 58  LDLCALC 62     Risk Assessment/Calculations/Metrics:              Physical Exam:    VS:  BP 136/70   Pulse 78   Ht '5\' 4"'$  (1.626 m)   Wt 197 lb 12.8 oz (89.7 kg)   SpO2 99%   BMI 33.95 kg/m     Wt Readings from Last 3 Encounters:  05/18/22 197 lb 12.8 oz (89.7 kg)  05/17/22 197 lb (89.4 kg)  05/07/22 197 lb (89.4 kg)    Constitutional:      Appearance: Healthy appearance. Not in distress.  Neck:     Vascular: No  JVR. JVD normal.  Pulmonary:     Effort: Pulmonary effort is normal.     Breath sounds: No wheezing. No rales.  Cardiovascular:     Normal rate. Regular rhythm. Normal S1. Normal S2.      Murmurs: There is no murmur.  Edema:    Peripheral edema absent.  Abdominal:     Palpations: Abdomen is soft.  Skin:    General: Skin is warm and dry.  Neurological:     Mental Status: Alert and oriented to person, place and time.         ASSESSMENT & PLAN:   Coronary artery disease involving native coronary artery of native heart without angina pectoris Mild nonobstructive disease on coronary CTA.  She is not having anginal symptoms.  We discussed the importance of risk factor modification.  Continue aspirin 81 mg daily, rosuvastatin 5 mg daily.  Follow-up in 6 months.  Hyperlipidemia LDL optimal.  Continue rosuvastatin 5 mg daily.  Essential hypertension Blood pressure controlled.  Continue Micardis 40 mg daily, Bystolic.  Paroxysmal SVT (supraventricular tachycardia) ([facility]) Brief runs of SVT noted on previous monitor.  She still has some symptoms.  Increase Bystolic to 5 mg daily.  Shortness of breath I suspect her dyspnea is all related to asthma.  She does have mild diastolic dysfunction on echocardiogram from December.  She is already on furosemide.  Obtain BNP today.  If this is significant elevated, I will increase her furosemide dose.           Dispo:  Return in about 6 months (around  11/16/2022) for Routine Follow Up, w/ Dr. Ali Lowe.   Medication Adjustments/Labs and Tests Ordered: Current medicines are reviewed at length with the patient today.  Concerns regarding medicines are outlined above.  Tests Ordered: Orders Placed This Encounter  Procedures   Pro b natriuretic peptide (BNP)   EKG 12-Lead   Medication Changes: Meds ordered this encounter  Medications   nebivolol (BYSTOLIC) 5 MG tablet    Sig: Take 1 tablet (5 mg total) by mouth daily.    Dispense:  90 tablet    Refill:  3   Signed, Richardson Dopp, PA-C  05/18/2022 9:29 AM    [address], [place], [place]  [address] Phone: [phone]; Fax: [phone]

## 2022-05-18 ENCOUNTER — Ambulatory Visit: Payer: Medicare Other | Attending: Physician Assistant | Admitting: Physician Assistant

## 2022-05-18 ENCOUNTER — Encounter: Payer: Self-pay | Admitting: Physician Assistant

## 2022-05-18 VITALS — BP 136/70 | HR 78 | Ht 64.0 in | Wt 197.8 lb

## 2022-05-18 DIAGNOSIS — I471 Supraventricular tachycardia: Secondary | ICD-10-CM

## 2022-05-18 DIAGNOSIS — E7800 Pure hypercholesterolemia, unspecified: Secondary | ICD-10-CM

## 2022-05-18 DIAGNOSIS — I10 Essential (primary) hypertension: Secondary | ICD-10-CM

## 2022-05-18 DIAGNOSIS — R0600 Dyspnea, unspecified: Secondary | ICD-10-CM | POA: Diagnosis not present

## 2022-05-18 DIAGNOSIS — I2510 Atherosclerotic heart disease of native coronary artery without angina pectoris: Secondary | ICD-10-CM

## 2022-05-18 DIAGNOSIS — R0602 Shortness of breath: Secondary | ICD-10-CM

## 2022-05-18 MED ORDER — NEBIVOLOL HCL 5 MG PO TABS: 5.0000 mg | ORAL_TABLET | Freq: Every day | ORAL | 3 refills | Status: DC

## 2022-05-18 NOTE — Assessment & Plan Note (Signed)
Blood pressure controlled.  Continue Micardis 40 mg daily, Bystolic.

## 2022-05-18 NOTE — Patient Instructions (Addendum)
Medication Instructions:  Your physician has recommended you make the following change in your medication:   INCREASE the Bystolic to 5 mg taking 1 daily   *If you need a refill on your cardiac medications before your next appointment, please call your pharmacy*   Lab Work: TODAY:  PRO BNP  If you have labs (blood work) drawn today and your tests are completely normal, you will receive your results only by: [place] (if you have MyChart) OR A paper copy in the mail If you have any lab test that is abnormal or we need to change your treatment, we will call you to review the results.   Testing/Procedures: None ordered   Follow-Up: At [facility], you and your health needs are our priority.  As part of our continuing mission to provide you with exceptional heart care, we have created designated Provider Care Teams.  These Care Teams include your primary Cardiologist (physician) and Advanced Practice Providers (APPs -  Physician Assistants and Nurse Practitioners) who all work together to provide you with the care you need, when you need it.  We recommend signing up for the patient portal called "MyChart".  Sign up information is provided on this After Visit Summary.  MyChart is used to connect with patients for Virtual Visits (Telemedicine).  Patients are able to view lab/test results, encounter notes, upcoming appointments, etc.  Non-urgent messages can be sent to your provider as well.   To learn more about what you can do with MyChart, go to [email or web address].    Your next appointment:   6 month(s)  11/12/22 ARRIVE AT 2:00   The format for your next appointment:   In Person  Provider:   Early Osmond, MD     Other Instructions   Important Information About Sugar

## 2022-05-18 NOTE — Assessment & Plan Note (Signed)
Brief runs of SVT noted on previous monitor.  She still has some symptoms.  Increase Bystolic to 5 mg daily.

## 2022-05-18 NOTE — Assessment & Plan Note (Signed)
Mild nonobstructive disease on coronary CTA.  She is not having anginal symptoms.  We discussed the importance of risk factor modification.  Continue aspirin 81 mg daily, rosuvastatin 5 mg daily.  Follow-up in 6 months.

## 2022-05-18 NOTE — Assessment & Plan Note (Signed)
LDL optimal.  Continue rosuvastatin 5 mg daily.

## 2022-05-18 NOTE — Assessment & Plan Note (Signed)
I suspect her dyspnea is all related to asthma.  She does have mild diastolic dysfunction on echocardiogram from December.  She is already on furosemide.  Obtain BNP today.  If this is significant elevated, I will increase her furosemide dose.

## 2022-05-19 LAB — PRO B NATRIURETIC PEPTIDE: NT-Pro BNP: 69 pg/mL (ref 0–301)

## 2022-05-23 ENCOUNTER — Ambulatory Visit (INDEPENDENT_AMBULATORY_CARE_PROVIDER_SITE_OTHER): Payer: Medicare Other | Admitting: Family Medicine

## 2022-05-23 ENCOUNTER — Encounter: Payer: Self-pay | Admitting: Family Medicine

## 2022-05-23 VITALS — BP 138/68 | HR 72 | Temp 97.9°F | Wt 199.0 lb

## 2022-05-23 DIAGNOSIS — E782 Mixed hyperlipidemia: Secondary | ICD-10-CM | POA: Diagnosis not present

## 2022-05-23 DIAGNOSIS — J4550 Severe persistent asthma, uncomplicated: Secondary | ICD-10-CM

## 2022-05-23 DIAGNOSIS — G4733 Obstructive sleep apnea (adult) (pediatric): Secondary | ICD-10-CM | POA: Diagnosis not present

## 2022-05-23 DIAGNOSIS — I2510 Atherosclerotic heart disease of native coronary artery without angina pectoris: Secondary | ICD-10-CM

## 2022-05-23 DIAGNOSIS — E118 Type 2 diabetes mellitus with unspecified complications: Secondary | ICD-10-CM | POA: Diagnosis not present

## 2022-05-23 DIAGNOSIS — J302 Other seasonal allergic rhinitis: Secondary | ICD-10-CM

## 2022-05-23 DIAGNOSIS — I10 Essential (primary) hypertension: Secondary | ICD-10-CM | POA: Diagnosis not present

## 2022-05-23 DIAGNOSIS — D860 Sarcoidosis of lung: Secondary | ICD-10-CM

## 2022-05-23 DIAGNOSIS — I471 Supraventricular tachycardia: Secondary | ICD-10-CM | POA: Diagnosis not present

## 2022-05-23 MED ORDER — FEXOFENADINE HCL 180 MG PO TABS: 180.0000 mg | ORAL_TABLET | Freq: Every day | ORAL | 3 refills | Status: DC

## 2022-05-23 NOTE — Progress Notes (Signed)
Subjective:    Patient ID: Suzanne Conner, female    DOB: 1945/09/23, 76 y.o.   MRN: [id number]  Chief Complaint  Patient presents with   Follow-up    BP    HPI Patient was seen today for follow-up on ongoing concerns.  Patient endorses feeling slightly better overall.  Seen by pulmonology.  Started on [employer] inhaler.  In the past the inhaler caused headaches.  Patient denies issues thus far.  Using albuterol inhaler less often.  Still having some shortness of breath with exertion.  Had sleep study.  CPAP recommended.  Had follow-up with cardiology.  Felt breathing problems were not related to heart issues.  Bystolic was increased to 5 mg for SVT.  Patient has noticed improvement in heart rate when up moving around.  BP at home stable typically 130s over 60s or 70s.  Has not noticed any numbers greater than 140/90.  Denies headaches, CP, changes in vision, LE edema.  Patient notes recent change in prescription for eyeglasses.  Plans on scheduling follow-up appointment as glasses do not seem to be quite right.  Patient notes allergy symptoms.  Taking OTC Allertec with no improvement.  Has not tried any other medications.  Had RSV vaccine last Wednesday, 04/15/2022 at pharmacy.  Seen by weight management.  States goal is to remain under 200 pounds.  Patient has not noticed any significant weight loss but has not gained any.  Endorses stress eating as her daughter was having surgery last week. Past Medical History:  Diagnosis Date   Arthritis    "left hip; right wrist" (06/12/2018)   Asthma    Depression    Family history of polyps in the colon    GERD (gastroesophageal reflux disease)    Glaucoma    Hypertension    Paroxysmal SVT (supraventricular tachycardia) ([place]) 03/23/2022   2 Supraventricular Tachycardia runs occurred, the run with the fastest interval lasting 19 beats with a max rate of 218 bpm    Type 2 diabetes, diet controlled (HCC)     Allergies  Allergen Reactions   Paxil  [Paroxetine]     Paxil 20 mg caused a tremor.   Penicillins Hives and Other (See Comments)    PATIENT HAS HAD A PCN REACTION WITH IMMEDIATE RASH, FACIAL/TONGUE/THROAT SWELLING, SOB, OR LIGHTHEADEDNESS WITH HYPOTENSION:  #  #  YES  #  #  INCLUDING HIVES AS A CHILD Has patient had a PCN reaction causing severe rash involving mucus membranes or skin necrosis: No Has patient had a PCN reaction that required hospitalization: No Has patient had a PCN reaction occurring within the last 10 years: No If all of the above answers are "NO", then may proceed with Cephalosporin use. PATIENT HAS HAD A PCN REACTION WITH IMMEDIATE RASH, FACIAL/TONGUE/THROAT SWELLING, SOB, OR LIGHTHEADEDNESS WITH HYPOTENSION:  #  #  YES  #  #  INCLUDING HIVES AS A CHILD Has patient had a PCN reaction causing severe rash involving mucus membranes or skin necrosis: No Has patient had a PCN reaction that required hospitalization: No Has patient had a PCN reaction occurring within the last 10 years: No If all of the above answers are "NO", then may proceed with Cephalosporin use.    Atorvastatin     Joint pain on '40mg'$    Simvastatin     Joint pain on '20mg'$  daily    ROS General: Denies fever, chills, night sweats +changes in weight, changes in appetite HEENT: Denies headaches, ear pain, changes in vision, rhinorrhea,  sore throat CV: Denies CP, palpitations, SOB, orthopnea +SOB Pulm: Denies cough  +SOB, wheezing GI: Denies abdominal pain, nausea, vomiting, diarrhea, constipation GU: Denies dysuria, hematuria, frequency, vaginal discharge Msk: Denies muscle cramps, joint pains Neuro: Denies weakness, numbness, tingling Skin: Denies rashes, bruising Psych: Denies depression, hallucinations  +anxiety     Objective:    Blood pressure 138/68, pulse 72, temperature 97.9 F (36.6 C), temperature source Oral, weight 199 lb (90.3 kg), SpO2 97 %.Body mass index is 34.16 kg/m.  Gen. Pleasant, well-nourished, in no distress,flat  affect   HEENT: [place]/AT, face symmetric, conjunctiva clear, no scleral icterus, PERRLA, EOMI, nares patent without drainage Lungs: Transmitted upper airway noises, no accessory muscle use, CTAB, no wheezes or rales Cardiovascular: RRR, no m/r/g, no peripheral edema Musculoskeletal: No deformities, no cyanosis or clubbing, normal tone Neuro:  A&Ox3, CN II-XII intact, normal gait Skin:  Warm, no lesions/ rash  Diabetic Foot Exam - Simple   Simple Foot Form Diabetic Foot exam was performed with the following findings: Yes 05/23/2022  8:56 AM  Visual Inspection No deformities, no ulcerations, no other skin breakdown bilaterally: Yes Sensation Testing Intact to touch and monofilament testing bilaterally: Yes Pulse Check Posterior Tibialis and Dorsalis pulse intact bilaterally: Yes Comments     Wt Readings from Last 3 Encounters:  05/23/22 199 lb (90.3 kg)  05/18/22 197 lb 12.8 oz (89.7 kg)  05/17/22 197 lb (89.4 kg)    Lab Results  Component Value Date   WBC 7.9 05/11/2021   HGB 13.3 05/11/2021   HCT 40.1 05/11/2021   PLT 292.0 05/11/2021   GLUCOSE 104 (H) 02/20/2022   CHOL 133 03/22/2022   TRIG 65 03/22/2022   HDL 58 03/22/2022   LDLCALC 62 03/22/2022   ALT 18 05/11/2021   AST 19 05/11/2021   NA 142 02/20/2022   K 4.4 02/20/2022   CL 104 02/20/2022   CREATININE 0.69 02/20/2022   BUN 14 02/20/2022   CO2 25 02/20/2022   TSH 0.889 02/20/2022   HGBA1C 6.0 (H) 03/22/2022    Assessment/Plan:  Essential hypertension -Controlled -Continue Bystolic 5 mg daily, telmisartan 40 mg daily -Continue lifestyle modifications -Continue monitoring BP at home and keeping a log to bring to clinic -Continue follow-up with cardiology  Seasonal allergies -Increased symptoms. -Postnasal drainage/allergy symptoms likely contributing to asthma -D/c OTC Allertec as not effective -We will start Allegra.  Patient can try half tab to see how medication makes her feel.  If needed  - Plan:  fexofenadine (ALLEGRA) 180 MG tablet  Mixed hyperlipidemia -Continue Crestor 5 mg daily -Lifestyle modifications  Paroxysmal SVT (supraventricular tachycardia) (HCC) -Controlled since Bystolic increased to 5 mg daily -Continue to monitor -Continue follow-up cardiology  Coronary artery disease involving native coronary artery of native heart without angina pectoris -s/p CT coronary calcium score -Continue lifestyle modifications and medication management -Continue Crestor 5 mg daily -Continue follow-up with cardiology  Controlled type 2 diabetes mellitus with complication, without long-term current use of insulin (HCC) -Hemoglobin A1c 6.0% on 03/22/2022 -Continue current medications including Trulicity and Jardiance 10 mg daily -Foot exam done this visit -Continue ARB and statin -Eye exam done 2 months ago (03/2022)  Pulmonary sarcoidosis ([place]) -Stable -Continue follow-up with pulmonology  OSA -CPAP nightly -Continue follow-up with pulmonology/sleep medicine  Severe persistent asthma -Continue [place] -Continue albuterol inhaler as needed -Discussed allergy control likely to help with asthma symptoms.  Start Allegra as needed -Continue follow-up with pulmonology  F/u in 4-6 months, sooner if needed.  Leslea Vowles, MD 

## 2022-05-23 NOTE — Patient Instructions (Addendum)
It appears that you still have refills on your regular medications.  A prescription for Allegra 180 mg was sent to your local pharmacy for allergies.  You can try taking half a tab to see if it makes you drowsy.  If it is not quite enough to control symptoms you can take a whole tab.  The PPSV23 pneumococcal vaccine in 2019.  As such it is recommended that you consider having a dose of PCV 20 which is at a different type of pneumococcal vaccine continue to be done.  You are having this vaccination at your local pharmacy.  I would recommend getting your flu shot COVID-vaccine then getting PCV 20.

## 2022-06-01 DIAGNOSIS — Z23 Encounter for immunization: Secondary | ICD-10-CM | POA: Diagnosis not present

## 2022-06-07 ENCOUNTER — Encounter: Payer: Self-pay | Admitting: Bariatrics

## 2022-06-07 ENCOUNTER — Ambulatory Visit (INDEPENDENT_AMBULATORY_CARE_PROVIDER_SITE_OTHER): Payer: Medicare Other | Admitting: Bariatrics

## 2022-06-07 VITALS — BP 132/75 | HR 73 | Temp 97.7°F | Ht 62.0 in | Wt 194.0 lb

## 2022-06-07 DIAGNOSIS — Z6835 Body mass index (BMI) 35.0-35.9, adult: Secondary | ICD-10-CM | POA: Diagnosis not present

## 2022-06-07 DIAGNOSIS — R632 Polyphagia: Secondary | ICD-10-CM | POA: Diagnosis not present

## 2022-06-07 DIAGNOSIS — E669 Obesity, unspecified: Secondary | ICD-10-CM

## 2022-06-07 DIAGNOSIS — Z7985 Long-term (current) use of injectable non-insulin antidiabetic drugs: Secondary | ICD-10-CM | POA: Diagnosis not present

## 2022-06-07 DIAGNOSIS — E1169 Type 2 diabetes mellitus with other specified complication: Secondary | ICD-10-CM

## 2022-06-07 MED ORDER — TRULICITY 3 MG/0.5ML ~~LOC~~ SOAJ: 3.0000 mg | SUBCUTANEOUS | 0 refills | Status: DC

## 2022-06-11 ENCOUNTER — Encounter: Payer: Self-pay | Admitting: Bariatrics

## 2022-06-11 ENCOUNTER — Ambulatory Visit: Payer: TRICARE For Life (TFL) | Admitting: Family Medicine

## 2022-06-11 NOTE — Progress Notes (Signed)
Chief Complaint:   OBESITY Suzanne Conner is here to discuss her progress with her obesity treatment plan along with follow-up of her obesity related diagnoses. Suzanne Conner is on the Category 1 Plan and states she is following her eating plan approximately 60-70% of the time. Suzanne Conner states she is walking for 30 minutes 2 times per week.  Today's visit was #: 4 Starting weight: 197 lbs Starting date: 03/22/2022 Today's weight: 194 lbs Today's date: 06/07/2022 Total lbs lost to date: 3 Total lbs lost since last in-office visit: 3  Interim History: Suzanne Conner is down 3 lbs since her last visit. She is getting better with her water intake.   Subjective:   1. Polyphagia Anahid denies nausea. She wakes up hungry in the evening.   2. Diabetes mellitus type 2 in obese [facility]) Suzanne Conner is taking Trulicity 1.5 mg.   Assessment/Plan:   1. Polyphagia Gwendolyn will increase fiber (will do it slowly), and increase her water intake. She will increase Trulicity as directed.   2. Diabetes mellitus type 2 in obese [facility]) Suzanne Conner agreed to increase Trulicity from 1.5 mg to 3 mg once weekly with no refills.   - Dulaglutide (TRULICITY) 3 YQ/0.3KV SOPN; Inject 3 mg as directed once a week.  Dispense: 2 mL; Refill: 0  3. Obesity, Current BMI 35.6 Suzanne Conner is currently in the action stage of change. As such, her goal is to continue with weight loss efforts. She has agreed to the Category 1 Plan.   She will increase her fiber and water intake. Increase water to at least 64 oz daily.   Exercise goals: As is.   Behavioral modification strategies: increasing lean protein intake, decreasing simple carbohydrates, increasing vegetables, increasing water intake, decreasing eating out, no skipping meals, meal planning and cooking strategies, keeping healthy foods in the home, and planning for success.  Suzanne Conner has agreed to follow-up with our clinic in 2 to 3 weeks. She was informed of the importance of frequent  follow-up visits to maximize her success with intensive lifestyle modifications for her multiple health conditions.   Objective:   Blood pressure 132/75, pulse 73, temperature 97.7 F (36.5 C), height '5\' 2"'$  (1.575 m), weight 194 lb (88 kg), SpO2 100 %. Body mass index is 35.48 kg/m.  General: Cooperative, alert, well developed, in no acute distress. HEENT: Conjunctivae and lids unremarkable. Cardiovascular: Regular rhythm.  Lungs: Normal work of breathing. Neurologic: No focal deficits.   Lab Results  Component Value Date   CREATININE 0.69 02/20/2022   BUN 14 02/20/2022   NA 142 02/20/2022   K 4.4 02/20/2022   CL 104 02/20/2022   CO2 25 02/20/2022   Lab Results  Component Value Date   ALT 18 05/11/2021   AST 19 05/11/2021   ALKPHOS 65 05/11/2021   BILITOT 0.6 05/11/2021   Lab Results  Component Value Date   HGBA1C 6.0 (H) 03/22/2022   HGBA1C 5.8 (A) 11/16/2021   HGBA1C 5.9 05/11/2021   HGBA1C 5.6 03/18/2020   HGBA1C 6.0 06/17/2019   Lab Results  Component Value Date   INSULIN 9.6 03/22/2022   Lab Results  Component Value Date   TSH 0.889 02/20/2022   Lab Results  Component Value Date   CHOL 133 03/22/2022   HDL 58 03/22/2022   LDLCALC 62 03/22/2022   TRIG 65 03/22/2022   CHOLHDL 2 05/11/2021   Lab Results  Component Value Date   VD25OH 80.2 03/22/2022   Lab Results  Component Value Date  WBC 7.9 05/11/2021   HGB 13.3 05/11/2021   HCT 40.1 05/11/2021   MCV 85.1 05/11/2021   PLT 292.0 05/11/2021   No results found for: "IRON", "TIBC", "FERRITIN"  Attestation Statements:   Reviewed by clinician on day of visit: allergies, medications, problem list, medical history, surgical history, family history, social history, and previous encounter notes.   Wilhemena Durie, am acting as Location manager for [employer], DO.  I have reviewed the above documentation for accuracy and completeness, and I agree with the above. Jearld Lesch, DO

## 2022-06-18 ENCOUNTER — Other Ambulatory Visit: Payer: Self-pay | Admitting: Pulmonary Disease

## 2022-06-28 ENCOUNTER — Ambulatory Visit (INDEPENDENT_AMBULATORY_CARE_PROVIDER_SITE_OTHER): Payer: Medicare Other | Admitting: Bariatrics

## 2022-06-28 ENCOUNTER — Encounter: Payer: Self-pay | Admitting: Bariatrics

## 2022-06-28 VITALS — BP 132/80 | HR 78 | Temp 97.9°F | Ht 62.0 in | Wt 193.0 lb

## 2022-06-28 DIAGNOSIS — E1169 Type 2 diabetes mellitus with other specified complication: Secondary | ICD-10-CM

## 2022-06-28 DIAGNOSIS — K5909 Other constipation: Secondary | ICD-10-CM | POA: Diagnosis not present

## 2022-06-28 DIAGNOSIS — I10 Essential (primary) hypertension: Secondary | ICD-10-CM

## 2022-06-28 DIAGNOSIS — Z7985 Long-term (current) use of injectable non-insulin antidiabetic drugs: Secondary | ICD-10-CM | POA: Diagnosis not present

## 2022-06-28 DIAGNOSIS — Z6835 Body mass index (BMI) 35.0-35.9, adult: Secondary | ICD-10-CM | POA: Diagnosis not present

## 2022-06-28 DIAGNOSIS — E669 Obesity, unspecified: Secondary | ICD-10-CM | POA: Diagnosis not present

## 2022-06-28 MED ORDER — TRULICITY 3 MG/0.5ML ~~LOC~~ SOAJ
3.0000 mg | SUBCUTANEOUS | 0 refills | Status: DC
Start: 2022-06-28 — End: 2022-07-19

## 2022-07-08 NOTE — Progress Notes (Signed)
Chief Complaint:   OBESITY Suzanne Conner is here to discuss her progress with her obesity treatment plan along with follow-up of her obesity related diagnoses. Suzanne Conner is on the Category 1 Plan and states she is following her eating plan approximately 80% of the time. Suzanne Conner states she is walking for 30 minutes 3-4 times per week.  Today's visit was #: 5 Starting weight: 197 lbs Starting date: 03/22/2022 Today's weight: 193 lbs Today's date: 06/28/2022 Total lbs lost to date: 4 Total lbs lost since last in-office visit: 1  Interim History: Suzanne Conner is down 1 pound since her last visit.  She is trying to increase her protein, decrease her carbohydrates, and increase her water intake.  Subjective:   1. Diabetes mellitus type 2 in obese [facility]) Suzanne Conner is taking Trulicity as directed.  2. Essential hypertension Suzanne Conner is taking Bystolic and Micardis, and her blood pressure is controlled.  3. Other constipation Suzanne Conner notes constipation secondary to GLP-1.  She is taking magnesium OTC.  Assessment/Plan:   1. Diabetes mellitus type 2 in obese [facility]) Suzanne Conner will continue Trulicity 3 mg once weekly, and we will refill for 1 month.  - Dulaglutide (TRULICITY) 3 EY/8.1KG SOPN; Inject 3 mg as directed once a week.  Dispense: 2 mL; Refill: 0  2. Essential hypertension Suzanne Conner will continue her medications as directed.   3. Other constipation Suzanne Conner agreed to start MiraLAX or Citrucel daily.  Fiber handout was given.  4. Obesity, Current BMI 35.4 Suzanne Conner is currently in the action stage of change. As such, her goal is to continue with weight loss efforts. She has agreed to the Category 1 Plan.   Meal planning and intentional eating were discussed.  Exercise goals: As is.  Behavioral modification strategies: increasing lean protein intake, decreasing simple carbohydrates, increasing vegetables, increasing water intake, decreasing eating out, no skipping meals, meal planning and  cooking strategies, keeping healthy foods in the home, and planning for success.  Suzanne Conner has agreed to follow-up with our clinic in 2 to 3 weeks. She was informed of the importance of frequent follow-up visits to maximize her success with intensive lifestyle modifications for her multiple health conditions.   Objective:   Blood pressure 132/80, pulse 78, temperature 97.9 F (36.6 C), height '5\' 2"'$  (1.575 m), weight 193 lb (87.5 kg), SpO2 99 %. Body mass index is 35.3 kg/m.  General: Cooperative, alert, well developed, in no acute distress. HEENT: Conjunctivae and lids unremarkable. Cardiovascular: Regular rhythm.  Lungs: Normal work of breathing. Neurologic: No focal deficits.   Lab Results  Component Value Date   CREATININE 0.69 02/20/2022   BUN 14 02/20/2022   NA 142 02/20/2022   K 4.4 02/20/2022   CL 104 02/20/2022   CO2 25 02/20/2022   Lab Results  Component Value Date   ALT 18 05/11/2021   AST 19 05/11/2021   ALKPHOS 65 05/11/2021   BILITOT 0.6 05/11/2021   Lab Results  Component Value Date   HGBA1C 6.0 (H) 03/22/2022   HGBA1C 5.8 (A) 11/16/2021   HGBA1C 5.9 05/11/2021   HGBA1C 5.6 03/18/2020   HGBA1C 6.0 06/17/2019   Lab Results  Component Value Date   INSULIN 9.6 03/22/2022   Lab Results  Component Value Date   TSH 0.889 02/20/2022   Lab Results  Component Value Date   CHOL 133 03/22/2022   HDL 58 03/22/2022   LDLCALC 62 03/22/2022   TRIG 65 03/22/2022   CHOLHDL 2 05/11/2021   Lab Results  Component  Value Date   VD25OH 80.2 03/22/2022   Lab Results  Component Value Date   WBC 7.9 05/11/2021   HGB 13.3 05/11/2021   HCT 40.1 05/11/2021   MCV 85.1 05/11/2021   PLT 292.0 05/11/2021   No results found for: "IRON", "TIBC", "FERRITIN"  Attestation Statements:   Reviewed by clinician on day of visit: allergies, medications, problem list, medical history, surgical history, family history, social history, and previous encounter notes.   Wilhemena Durie, am acting as Location manager for [employer], DO.  I have reviewed the above documentation for accuracy and completeness, and I agree with the above. Jearld Lesch, DO

## 2022-07-18 ENCOUNTER — Encounter: Payer: Self-pay | Admitting: Bariatrics

## 2022-07-19 ENCOUNTER — Ambulatory Visit (INDEPENDENT_AMBULATORY_CARE_PROVIDER_SITE_OTHER): Payer: Medicare Other | Admitting: Bariatrics

## 2022-07-19 ENCOUNTER — Encounter: Payer: Self-pay | Admitting: Bariatrics

## 2022-07-19 VITALS — BP 138/74 | HR 70 | Temp 97.8°F | Ht 62.0 in | Wt 196.0 lb

## 2022-07-19 DIAGNOSIS — Z7985 Long-term (current) use of injectable non-insulin antidiabetic drugs: Secondary | ICD-10-CM | POA: Diagnosis not present

## 2022-07-19 DIAGNOSIS — E1169 Type 2 diabetes mellitus with other specified complication: Secondary | ICD-10-CM

## 2022-07-19 DIAGNOSIS — Z6835 Body mass index (BMI) 35.0-35.9, adult: Secondary | ICD-10-CM | POA: Diagnosis not present

## 2022-07-19 DIAGNOSIS — E669 Obesity, unspecified: Secondary | ICD-10-CM

## 2022-07-19 DIAGNOSIS — F5089 Other specified eating disorder: Secondary | ICD-10-CM | POA: Diagnosis not present

## 2022-07-19 DIAGNOSIS — F509 Eating disorder, unspecified: Secondary | ICD-10-CM | POA: Insufficient documentation

## 2022-07-19 DIAGNOSIS — E782 Mixed hyperlipidemia: Secondary | ICD-10-CM | POA: Diagnosis not present

## 2022-07-19 MED ORDER — TOPIRAMATE 50 MG PO TABS: 50.0000 mg | ORAL_TABLET | Freq: Every day | ORAL | 0 refills | Status: DC

## 2022-07-19 MED ORDER — TRULICITY 4.5 MG/0.5ML ~~LOC~~ SOAJ: 4.5000 mg | SUBCUTANEOUS | 0 refills | Status: DC

## 2022-08-01 ENCOUNTER — Encounter: Payer: Self-pay | Admitting: Bariatrics

## 2022-08-01 NOTE — Progress Notes (Signed)
Chief Complaint:   OBESITY Suzanne Conner is here to discuss her progress with her obesity treatment plan along with follow-up of her obesity related diagnoses. Suzanne Conner is on the Category 1 Plan and states she is following her eating plan approximately 60% of the time. Suzanne Conner states she is walking for 30 minutes 3 times per week.  Today's visit was #: 6 Starting weight: 197 lbs Starting date: 03/22/2022 Today's weight: 196 lbs Today's date: 07/19/2022 Total lbs lost to date: 1 Total lbs lost since last in-office visit: 0  Interim History: Suzanne Conner is up 3 pounds since her last visit.  She still gets hungry with the medication.  Subjective:   1. Diabetes mellitus type 2 in obese Suzanne Conner [facility]) Suzanne Conner is taking Trulicity as directed.  2. Mixed hyperlipidemia Suzanne Conner is taking Crestor as directed.  3. Other disorder of eating Morning notes increased cravings.  Assessment/Plan:   1. Diabetes mellitus type 2 in obese [facility]) Suzanne Conner agreed to increase Trulicity from 3 mg to 4.5 mg once weekly, and we will refill for 1 month.  - Dulaglutide (TRULICITY) 4.5 DJ/2.4QA SOPN; Inject 4.5 mg as directed once a week.  Dispense: 2 mL; Refill: 0  2. Mixed hyperlipidemia Suzanne Conner will continue Crestor as directed.  We will follow-up at her next visit.  3. Other disorder of eating Suzanne Conner agreed to start Topamax 50 mg once daily with supper, with no refills.  We will follow-up at her next visit.  - topiramate (TOPAMAX) 50 MG tablet; Take 1 tablet (50 mg total) by mouth daily. With supper daily  Dispense: 30 tablet; Refill: 0  4. Obesity, Current BMI 35.8 Suzanne Conner is currently in the action stage of change. As such, her goal is to continue with weight loss efforts. She has agreed to the Category 1 Plan.   She will adhere closely to the plan 85-95%.  Thanksgiving strategies were given.  Exercise goals: As is.  Behavioral modification strategies: increasing lean protein intake, decreasing simple  carbohydrates, increasing vegetables, increasing water intake, decreasing eating out, no skipping meals, meal planning and cooking strategies, keeping healthy foods in the home, and planning for success.  Suzanne Conner has agreed to follow-up with our clinic in 3 to 4 weeks. She was informed of the importance of frequent follow-up visits to maximize her success with intensive lifestyle modifications for her multiple health conditions.   Objective:   Blood pressure 138/74, pulse 70, temperature 97.8 F (36.6 C), height '5\' 2"'$  (1.575 m), weight 196 lb (88.9 kg), SpO2 99 %. Body mass index is 35.85 kg/m.  General: Cooperative, alert, well developed, in no acute distress. HEENT: Conjunctivae and lids unremarkable. Cardiovascular: Regular rhythm.  Lungs: Normal work of breathing. Neurologic: No focal deficits.   Lab Results  Component Value Date   CREATININE 0.69 02/20/2022   BUN 14 02/20/2022   NA 142 02/20/2022   K 4.4 02/20/2022   CL 104 02/20/2022   CO2 25 02/20/2022   Lab Results  Component Value Date   ALT 18 05/11/2021   AST 19 05/11/2021   ALKPHOS 65 05/11/2021   BILITOT 0.6 05/11/2021   Lab Results  Component Value Date   HGBA1C 6.0 (H) 03/22/2022   HGBA1C 5.8 (A) 11/16/2021   HGBA1C 5.9 05/11/2021   HGBA1C 5.6 03/18/2020   HGBA1C 6.0 06/17/2019   Lab Results  Component Value Date   INSULIN 9.6 03/22/2022   Lab Results  Component Value Date   TSH 0.889 02/20/2022   Lab Results  Component  Value Date   CHOL 133 03/22/2022   HDL 58 03/22/2022   LDLCALC 62 03/22/2022   TRIG 65 03/22/2022   CHOLHDL 2 05/11/2021   Lab Results  Component Value Date   VD25OH 80.2 03/22/2022   Lab Results  Component Value Date   WBC 7.9 05/11/2021   HGB 13.3 05/11/2021   HCT 40.1 05/11/2021   MCV 85.1 05/11/2021   PLT 292.0 05/11/2021   No results found for: "IRON", "TIBC", "FERRITIN"  Attestation Statements:   Reviewed by clinician on day of visit: allergies, medications,  problem list, medical history, surgical history, family history, social history, and previous encounter notes.   Wilhemena Durie, am acting as Location manager for [employer], DO.  I have reviewed the above documentation for accuracy and completeness, and I agree with the above. Jearld Lesch, DO

## 2022-08-06 ENCOUNTER — Encounter (HOSPITAL_BASED_OUTPATIENT_CLINIC_OR_DEPARTMENT_OTHER): Payer: Self-pay | Admitting: Pulmonary Disease

## 2022-08-06 ENCOUNTER — Ambulatory Visit (INDEPENDENT_AMBULATORY_CARE_PROVIDER_SITE_OTHER): Payer: Medicare Other | Admitting: Pulmonary Disease

## 2022-08-06 VITALS — BP 126/70 | HR 77 | Ht 62.0 in | Wt 194.6 lb

## 2022-08-06 DIAGNOSIS — D860 Sarcoidosis of lung: Secondary | ICD-10-CM

## 2022-08-06 DIAGNOSIS — J4540 Moderate persistent asthma, uncomplicated: Secondary | ICD-10-CM

## 2022-08-06 DIAGNOSIS — G4733 Obstructive sleep apnea (adult) (pediatric): Secondary | ICD-10-CM

## 2022-08-06 NOTE — Progress Notes (Signed)
Subjective:   PATIENT ID: Suzanne Conner GENDER: female DOB: 1946/03/23, MRN: [id number]   HPI  Chief Complaint  Patient presents with   Follow-up    Have not gotten cpap machine, they are on back order    Reason for Visit: Follow-up  Suzanne Conner is a 76 year old female never smoker with childhood asthma, diabetes (diet controlled), hypertension and GERD who presents for follow-up  Synopsis:  She was previously seen by a Pulmonary doctor in [place] 22 years ago for sarcoid diagnosed via bronchoscopy. She is unable to recall any prolonged treatment. Has not had any respiratory issues since then. Was only seen for ~1 year and reports CXR improved during that time which is suggestive Lofgren syndrome. Denies skin involvement except for seborrheic keratosis. Sees an ophthalmologist for high optic pressures.   On initial consultation had shortness of breath and wheezing x 2 months. She lives in a two story home and now having difficulty walking up.   2022 - Improved symptoms on medium dose Advair however still not much stamina since the pandemic. Completed Pulm Rehab 2023 - Persistent shortness of breath. Reported frequent nighttime awakenings and am headaches. Dx with mild OSA and started on CPAP. Improved asthma symptoms on Wixela 500  08/06/22 Since her last visit we increased her ICS inhaler strength for uncontrolled asthma symptoms.  We had discussed managing her symptoms for asthma versus sarcoid flare. She has improved shortness of breath and no wheezing or coughing. She still catches her breath when walking upstairs. Only needed albuterol five times in the last 3 months which is an improvement. Has not prioritized exercising with her husband's health.   Asthma Control Test ACT Total Score  02/22/2022 10:40 AM 17  01/04/2021  2:14 PM 16   Review of Systems  Constitutional:  Negative for chills, diaphoresis, fever, malaise/fatigue and weight loss.  HENT:  Negative for  congestion.   Respiratory:  Positive for shortness of breath. Negative for cough, hemoptysis, sputum production and wheezing.   Cardiovascular:  Negative for chest pain, palpitations and leg swelling.   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   Penicillins Hives and Other (See Comments)    PATIENT HAS HAD A PCN REACTION WITH IMMEDIATE RASH, FACIAL/TONGUE/THROAT SWELLING, SOB, OR LIGHTHEADEDNESS WITH HYPOTENSION:  #  #  YES  #  #  INCLUDING HIVES AS A CHILD Has patient had a PCN reaction causing severe rash involving mucus membranes or skin necrosis: No Has patient had a PCN reaction that required hospitalization: No Has patient had a PCN reaction occurring within the last 10 years: No If all of the above answers are "NO", then may proceed with Cephalosporin use. PATIENT HAS HAD A PCN REACTION WITH IMMEDIATE RASH, FACIAL/TONGUE/THROAT SWELLING, SOB, OR LIGHTHEADEDNESS WITH HYPOTENSION:  #  #  YES  #  #  INCLUDING HIVES AS A CHILD Has patient had a PCN reaction causing severe rash involving mucus membranes or skin necrosis: No Has patient had a PCN reaction that required hospitalization: No Has patient had a PCN reaction occurring within the last 10 years: No If all of the above answers are "NO", then may proceed with Cephalosporin use.    Atorvastatin     Joint pain on '40mg'$    Simvastatin     Joint pain on '20mg'$  daily     Outpatient  Medications Prior to Visit  Medication Sig Dispense Refill   albuterol (VENTOLIN HFA) 108 (90 Base) MCG/ACT inhaler Inhale 2 puffs into the lungs every 6 (six) hours as needed for wheezing or shortness of breath. 3 each 2   aspirin EC 81 MG tablet Take 81 mg by mouth daily.     Cholecalciferol (VITAMIN D) 2000 units CAPS Take 2,000 Units by mouth daily.     clindamycin (CLEOCIN) 300 MG capsule Take 300 mg by mouth 2 (two) times daily. Prior to dental appointments     Clobetasol Propionate 0.05 % shampoo Use a small amount on the affected areas twice a day as needed. 118 mL 0   Dulaglutide (TRULICITY) 4.5 ZO/1.0RU SOPN Inject 4.5 mg as directed once a week. 2 mL 0   empagliflozin (JARDIANCE) 10 MG TABS tablet Take 1 tablet (10 mg total) by mouth daily before breakfast. 90 tablet 3   fexofenadine (ALLEGRA) 180 MG tablet Take 1 tablet (180 mg total) by mouth daily. 30 tablet 3   furosemide (LASIX) 20 MG tablet Take 1 tablet (20 mg total) by mouth 2 (two) times daily. 180 tablet 3   hydrocortisone valerate cream (WESTCORT) 0.2 % Apply 1 application topically 2 (two) times daily. 45 g 0   latanoprost (XALATAN) 0.005 % ophthalmic solution Place 1 drop into both eyes at bedtime.     Melatonin 10 MG TABS Take 1 tablet by mouth at bedtime as needed.     Multiple Vitamins-Minerals (CENTRUM SILVER PO) Take 1 tablet by mouth daily.      nebivolol (BYSTOLIC) 5 MG tablet Take 1 tablet (5 mg total) by mouth daily. 90 tablet 3   omeprazole (PRILOSEC) 40 MG capsule TAKE 1 CAPSULE TWICE A DAY 180 capsule 3   OPTIVE 0.5-0.9 % ophthalmic solution Place 1 drop into both eyes 2 (two) times daily.     Probiotic Product (TRUNATURE DIGESTIVE PROBIOTIC) CAPS Take 1 capsule by mouth daily.      RESTASIS 0.05 % ophthalmic emulsion Place 1 drop into both eyes in the morning and at bedtime.     rosuvastatin (CRESTOR) 5 MG tablet Take 1 tablet (5 mg total) by mouth daily. 90 tablet 3   telmisartan (MICARDIS) 40 MG tablet Take  1 tablet (40 mg total) by mouth daily. 90 tablet 3   topiramate (TOPAMAX) 50 MG tablet Take 1 tablet (50 mg total) by mouth daily. With supper daily 30 tablet 0   WIXELA INHUB 500-50 MCG/ACT AEPB USE 1 INHALATION IN THE MORNING AND AT BEDTIME 180 each 3   fluticasone-salmeterol (WIXELA INHUB) 500-50 MCG/ACT AEPB Inhale 1 puff into the lungs in the morning and at bedtime.     No facility-administered medications prior to visit.    Objective:   Vitals:   08/06/22 0904  BP: 126/70  Pulse: 77  SpO2: 100%  Weight: 194 lb 9.6 oz (88.3 kg)  Height: '5\' 2"'$  (1.575 m)   SpO2: 100 % O2 Device: None (Room  air)  Physical Exam: General: Well-appearing, no acute distress HENT: , AT Eyes: EOMI, no scleral icterus Respiratory: Clear to auscultation bilaterally.  No crackles, wheezing or rales Cardiovascular: RRR, -M/R/G, no JVD Extremities:-Edema,-tenderness Neuro: AAO x4, CNII-XII grossly intact Psych: Normal mood, normal affect  Data Reviewed:  Imaging: CXR 07/12/2020-no infiltrate, effusion or edema. CT Coronary 03/20/22 - Visualized lung parenchyma with no pulmonary nodules, masses, infiltrate, effusion or pneumothorax. Large hiatal hernia  PFT: 01/04/21 FVC 1.49 (62%) FEV1 1.10 (59%) Ratio 73  TLC 63% DLCO 62% Interpretation: Mixed obstructive and restrictive defect with mildly reduced DLCO. No significant bronchodilator response however does not preclude benefit of bronchodilators  05/04/22 FVC 1.36(45%) FEV1 1.06(45%) Ratio 74  TLC 63% DLCO 58% Interpretation: Mixed obstructive and restrictive defect with mildly reduced DLCO. Significant FEV1 bronchodilator response. Compared to 2022, reduced FEV1.  Labs: CBC    Component Value Date/Time   WBC 7.9 05/11/2021 1019   RBC 4.71 05/11/2021 1019   HGB 13.3 05/11/2021 1019   HCT 40.1 05/11/2021 1019   PLT 292.0 05/11/2021 1019   MCV 85.1 05/11/2021 1019   MCH 27.7 03/18/2020 1135   MCHC 33.2 05/11/2021 1019   RDW 14.3 05/11/2021  1019   LYMPHSABS 2.9 05/11/2021 1019   MONOABS 0.7 05/11/2021 1019   EOSABS 0.1 05/11/2021 1019   BASOSABS 0.0 05/11/2021 1019   Absolute eos  03/18/20-70 05/11/21 - 100  Sleep: HST 04/19/22 - Mild OSA. AHI 6.7. Nadir SpO2 77%. Mean SpO2 92%    Assessment & Plan:   Discussion: 76 year old female never smoker with severe asthma, pulmonary sarcoid/Lofgren's, OSA, HTN, DM2 who presents for follow-up.  Previously reviewed PFTs which demonstrated worsening mixed defect severity from moderately severe to severe. Improved symptoms with ICS/LABA 500 mcg. Continues to have DOE with moderate activity. Discussed regular aerobic exercise including more intense activity if she only has limited time (15 min stair stepping). Her uncontrolled OSA may also be contributing. Lower suspicion that this is a sarcoid flare with improvement in symptoms.    We discussed the clinical course of sarcoid and management including serial PFTs, labs, eye exam, and EKG and chest imaging if indicated. If symptoms suggest sarcoid flare in the future, we would manage with steroids +/- biologics.  Mild OSA Epworth 15 - Previously ordered autoCPAP 5-15 cm H20 and supplies - Patient not received CPAP. DME has contacted patient regarding backlog  Severe persistent asthma - improved symptoms - CONTINUE Wixela Diskus 500-50 ONE puff TWICE a day - CONTINUE Albuterol TWO puffs as needed for shortness of breath or wheezing   Asthma action plan Use albuterol for worsening shortness of breath, wheezing and cough. If you symptoms do not improve in 24-48 hours, please our office for evaluation and/or prednisone taper.   Pulmonary sarcoidosis - Dx in 1999 via endobronchial bx. No records available - Hx of resolution in <1 year suggestive of Lofgren - No indication for prednisone therapy for now - Annual PFTs. Due 04/2023 - Recommend annual ophthalmology exam. [facility] 02/22/22 - no sarcoid invovlement - Reviewed EKG.  Prolonged P-R interval. Due at next visit. - Plan for repeat PFTs in 6-12 months pending clinical improvement (04/2023)   Health Maintenance Immunization History  Administered Date(s) Administered   Fluad Quad(high Dose 65+) 06/22/2020, 07/06/2022   Influenza, High Dose Seasonal PF 05/12/2019, 06/10/2021   Influenza-Unspecified 05/29/2018, 05/12/2019   PFIZER(Purple Top)SARS-COV-2 Vaccination 10/01/2019, 10/22/2019, 06/17/2020, 12/08/2020   Pfizer Covid-19 Vaccine Bivalent Booster 27yr & up 07/06/2022  Pneumococcal Polysaccharide-23 06/13/2018   Pneumococcal-Unspecified 07/06/2022   RSV,unspecified 07/06/2022   Zoster Recombinat (Shingrix) 03/10/2019, 05/12/2019   Zoster, Live 04/27/2008   CT Lung Screen - not indicated  No orders of the defined types were placed in this encounter.  No orders of the defined types were placed in this encounter.   Return in about 4 months (around 12/05/2022). Sarcoid review at next visit  I have spent a total time of 30-minutes on the day of the appointment reviewing prior documentation, coordinating care and discussing medical diagnosis and plan with the patient/family. Past medical history, allergies, medications were reviewed. Pertinent imaging, labs and tests included in this note have been reviewed and interpreted independently by me.  [place], MD [facility] 08/06/2022  Office Number [phone]

## 2022-08-06 NOTE — Patient Instructions (Addendum)
Severe persistent asthma - improved symptoms - CONTINUE Wixela Diskus 500-50 ONE puff TWICE a day - CONTINUE Albuterol TWO puffs as needed for shortness of breath or wheezing  Asthma action plan Use albuterol for worsening shortness of breath, wheezing and cough. If you symptoms do not improve in 24-48 hours, please our office for evaluation and/or prednisone taper.  Pulmonary sarcoidosis With your improved symptoms, we are not concerned about sarcoid at this time  Mild OSA Epworth 15 - Previously ordered autoCPAP 5-15 cm H20 and supplies - Patient not received CPAP. DME has contacted patient regarding backlog  Follow-up with me in 4 months

## 2022-08-20 ENCOUNTER — Ambulatory Visit (INDEPENDENT_AMBULATORY_CARE_PROVIDER_SITE_OTHER): Payer: Medicare Other | Admitting: Bariatrics

## 2022-08-20 ENCOUNTER — Encounter: Payer: Self-pay | Admitting: Bariatrics

## 2022-08-20 VITALS — BP 128/74 | HR 71 | Temp 97.8°F | Ht 62.0 in | Wt 190.0 lb

## 2022-08-20 DIAGNOSIS — Z7984 Long term (current) use of oral hypoglycemic drugs: Secondary | ICD-10-CM | POA: Diagnosis not present

## 2022-08-20 DIAGNOSIS — F5089 Other specified eating disorder: Secondary | ICD-10-CM | POA: Diagnosis not present

## 2022-08-20 DIAGNOSIS — E669 Obesity, unspecified: Secondary | ICD-10-CM

## 2022-08-20 DIAGNOSIS — I10 Essential (primary) hypertension: Secondary | ICD-10-CM | POA: Diagnosis not present

## 2022-08-20 DIAGNOSIS — Z7985 Long-term (current) use of injectable non-insulin antidiabetic drugs: Secondary | ICD-10-CM

## 2022-08-20 DIAGNOSIS — E1169 Type 2 diabetes mellitus with other specified complication: Secondary | ICD-10-CM | POA: Diagnosis not present

## 2022-08-20 DIAGNOSIS — Z6834 Body mass index (BMI) 34.0-34.9, adult: Secondary | ICD-10-CM | POA: Diagnosis not present

## 2022-08-20 MED ORDER — TRULICITY 4.5 MG/0.5ML ~~LOC~~ SOAJ: 4.5000 mg | SUBCUTANEOUS | 0 refills | Status: DC

## 2022-09-04 ENCOUNTER — Encounter: Payer: Self-pay | Admitting: Bariatrics

## 2022-09-04 NOTE — Progress Notes (Signed)
Chief Complaint:   OBESITY Suzanne Conner is here to discuss her progress with her obesity treatment plan along with follow-up of her obesity related diagnoses. Suzanne Conner is on the Category 1 Plan and states she is following her eating plan approximately 50% of the time. Suzanne Conner states she is walking for 30 minutes 2-3 times per week.  Today's visit was #: 7 Starting weight: 197 lbs Starting date: 03/22/2022 Today's weight: 190 lbs Today's date: 08/20/2022 Total lbs lost to date: 7 Total lbs lost since last in-office visit: 6  Interim History: Suzanne Conner lost 6 pounds since her last visit.  She is always in the trying phase.  She has added MiraLAX.  Subjective:   1. Diabetes mellitus type 2 in obese [facility]) [name] is taking Trulicity and [place].  2. Essential hypertension [name] is taking Bystolic and Micardis, and her blood pressure is controlled.  3. Other disorder of eating [name] is taking Topamax, and she notes headache.   Assessment/Plan:   1. Diabetes mellitus type 2 in obese [facility]) Suzanne Conner will continue her medications, and we will refill Trulicity for 1 month.  - Dulaglutide (TRULICITY) 4.5 CW/2.3JS SOPN; Inject 4.5 mg as directed once a week.  Dispense: 2 mL; Refill: 0  2. Essential hypertension [name] will continue her medications as directed.  3. Other disorder of eating [name] will remain on Topamax with Tylenol.  Handout on the emotional hunger versus physical hunger was given.  4. Obesity, Current BMI 34.9 Suzanne Conner is currently in the action stage of change. As such, her goal is to continue with weight loss efforts. She has agreed to the Category 1 Plan.   Meal planning and intentional eating were discussed.  Exercise goals: As is, and recumbent bike.   Behavioral modification strategies: increasing lean protein intake, decreasing simple carbohydrates, increasing vegetables, increasing water intake, decreasing eating out, no skipping meals, meal planning  and cooking strategies, keeping healthy foods in the home, and planning for success.  Suzanne Conner has agreed to follow-up with our clinic in 3 weeks. She was informed of the importance of frequent follow-up visits to maximize her success with intensive lifestyle modifications for her multiple health conditions.   Objective:   Blood pressure 128/74, pulse 71, temperature 97.8 F (36.6 C), height '5\' 2"'$  (1.575 m), weight 190 lb (86.2 kg), SpO2 99 %. Body mass index is 34.75 kg/m.  General: Cooperative, alert, well developed, in no acute distress. HEENT: Conjunctivae and lids unremarkable. Cardiovascular: Regular rhythm.  Lungs: Normal work of breathing. Neurologic: No focal deficits.   Lab Results  Component Value Date   CREATININE 0.69 02/20/2022   BUN 14 02/20/2022   NA 142 02/20/2022   K 4.4 02/20/2022   CL 104 02/20/2022   CO2 25 02/20/2022   Lab Results  Component Value Date   ALT 18 05/11/2021   AST 19 05/11/2021   ALKPHOS 65 05/11/2021   BILITOT 0.6 05/11/2021   Lab Results  Component Value Date   HGBA1C 6.0 (H) 03/22/2022   HGBA1C 5.8 (A) 11/16/2021   HGBA1C 5.9 05/11/2021   HGBA1C 5.6 03/18/2020   HGBA1C 6.0 06/17/2019   Lab Results  Component Value Date   INSULIN 9.6 03/22/2022   Lab Results  Component Value Date   TSH 0.889 02/20/2022   Lab Results  Component Value Date   CHOL 133 03/22/2022   HDL 58 03/22/2022   LDLCALC 62 03/22/2022   TRIG 65 03/22/2022   CHOLHDL 2 05/11/2021   Lab Results  Component Value Date   VD25OH 80.2 03/22/2022   Lab Results  Component Value Date   WBC 7.9 05/11/2021   HGB 13.3 05/11/2021   HCT 40.1 05/11/2021   MCV 85.1 05/11/2021   PLT 292.0 05/11/2021   No results found for: "IRON", "TIBC", "FERRITIN"  Attestation Statements:   Reviewed by clinician on day of visit: allergies, medications, problem list, medical history, surgical history, family history, social history, and previous encounter notes.   Wilhemena Durie, am acting as Location manager for [employer], DO.  I have reviewed the above documentation for accuracy and completeness, and I agree with the above. Jearld Lesch, DO

## 2022-09-18 ENCOUNTER — Ambulatory Visit: Payer: Medicare Other | Admitting: Bariatrics

## 2022-09-19 ENCOUNTER — Encounter: Payer: Self-pay | Admitting: Bariatrics

## 2022-09-19 ENCOUNTER — Ambulatory Visit (INDEPENDENT_AMBULATORY_CARE_PROVIDER_SITE_OTHER): Payer: Medicare Other | Admitting: Bariatrics

## 2022-09-19 VITALS — BP 131/78 | Ht 62.0 in | Wt 189.0 lb

## 2022-09-19 DIAGNOSIS — Z7984 Long term (current) use of oral hypoglycemic drugs: Secondary | ICD-10-CM | POA: Diagnosis not present

## 2022-09-19 DIAGNOSIS — Z6834 Body mass index (BMI) 34.0-34.9, adult: Secondary | ICD-10-CM | POA: Diagnosis not present

## 2022-09-19 DIAGNOSIS — E1169 Type 2 diabetes mellitus with other specified complication: Secondary | ICD-10-CM

## 2022-09-19 DIAGNOSIS — R632 Polyphagia: Secondary | ICD-10-CM | POA: Diagnosis not present

## 2022-09-19 DIAGNOSIS — F5089 Other specified eating disorder: Secondary | ICD-10-CM | POA: Diagnosis not present

## 2022-09-19 DIAGNOSIS — E669 Obesity, unspecified: Secondary | ICD-10-CM | POA: Diagnosis not present

## 2022-09-19 DIAGNOSIS — Z7985 Long-term (current) use of injectable non-insulin antidiabetic drugs: Secondary | ICD-10-CM

## 2022-09-19 MED ORDER — TOPIRAMATE 50 MG PO TABS: 50.0000 mg | ORAL_TABLET | Freq: Every day | ORAL | 0 refills | Status: DC

## 2022-09-19 MED ORDER — TRULICITY 4.5 MG/0.5ML ~~LOC~~ SOAJ: 4.5000 mg | SUBCUTANEOUS | 0 refills | Status: DC

## 2022-09-21 ENCOUNTER — Telehealth: Payer: Self-pay

## 2022-09-21 ENCOUNTER — Ambulatory Visit (INDEPENDENT_AMBULATORY_CARE_PROVIDER_SITE_OTHER): Payer: Medicare Other | Admitting: Family Medicine

## 2022-09-21 ENCOUNTER — Encounter: Payer: Self-pay | Admitting: Family Medicine

## 2022-09-21 VITALS — BP 136/67 | HR 74 | Temp 98.2°F | Wt 195.2 lb

## 2022-09-21 DIAGNOSIS — I10 Essential (primary) hypertension: Secondary | ICD-10-CM | POA: Diagnosis not present

## 2022-09-21 DIAGNOSIS — G4733 Obstructive sleep apnea (adult) (pediatric): Secondary | ICD-10-CM

## 2022-09-21 DIAGNOSIS — E1169 Type 2 diabetes mellitus with other specified complication: Secondary | ICD-10-CM

## 2022-09-21 DIAGNOSIS — J4540 Moderate persistent asthma, uncomplicated: Secondary | ICD-10-CM

## 2022-09-21 DIAGNOSIS — E669 Obesity, unspecified: Secondary | ICD-10-CM | POA: Diagnosis not present

## 2022-09-21 LAB — POCT GLYCOSYLATED HEMOGLOBIN (HGB A1C): Hemoglobin A1C: 5.4 % (ref 4.0–5.6)

## 2022-09-21 NOTE — Telephone Encounter (Signed)
Contact [facility] pharmacy and spoke to [place]. He states the prescription for nebivolol was cancelled on 05/18/22 due to 2.'5mg'$  was sent in but that dosage got cancelled as well. Patient has no rx for nebivolol available.

## 2022-09-21 NOTE — Progress Notes (Signed)
Established Patient Office Visit  Subjective   Patient ID: Suzanne Conner, female    DOB: Oct 28, 1945  Age: 76 y.o. MRN: [id number]  Chief Complaint  Patient presents with   Follow-up    HPI  Patient is a 77 year old female with pmh sig for HTN, obesity, HLD, sarcoidosis of lung, asthma, anxiety close seen for follow-up and states she has been doing well for the most part.  Spent time with family during the holidays however her son had the flu.  Patient states BP at home well-controlled typically 606T systolic.  Needs refill on Bystolic.  followed by weight management.  Denies SOB or wheezing since using Wixela.  Plans to schedule diabetic retinopathy screening.  It is okay.  Hoping the weather gets warmer so she can go outside and exercise.  She did not get much sleep last night due to trying a new facemask for CPAP machine.  Tried several nasal pillow's without success.  Face mask suggested as ppt is a mouth breather.    Review of Systems  Constitutional:  Negative for chills and fever.  Respiratory:  Negative for cough, shortness of breath and wheezing.   Cardiovascular:  Negative for chest pain and leg swelling.  Skin:  Negative for rash.  Neurological:  Negative for dizziness, weakness and headaches.  Psychiatric/Behavioral:  Negative for depression. The patient is not nervous/anxious.       Objective:     BP 138/62 (BP Location: Right Arm, Cuff Size: Normal)   Pulse 74   Temp 98.2 F (36.8 C) (Oral)   Wt 195 lb 3.2 oz (88.5 kg)   SpO2 98%   BMI 35.70 kg/m    Physical Exam Constitutional:      Appearance: Normal appearance.  HENT:     Head: Normocephalic and atraumatic.     Nose: Nose normal.     Mouth/Throat:     Mouth: Mucous membranes are moist.  Eyes:     Extraocular Movements: Extraocular movements intact.     Conjunctiva/sclera: Conjunctivae normal.     Pupils: Pupils are equal, round, and reactive to light.  Cardiovascular:     Rate and Rhythm:  Normal rate and regular rhythm.     Heart sounds: Normal heart sounds. No murmur heard.    No friction rub. No gallop.  Pulmonary:     Effort: Pulmonary effort is normal.     Breath sounds: Normal breath sounds. No wheezing, rhonchi or rales.  Skin:    General: Skin is warm and dry.  Neurological:     Mental Status: She is alert and oriented to person, place, and time.  Psychiatric:        Mood and Affect: Mood normal.        Thought Content: Thought content normal.     Results for orders placed or performed in visit on 09/21/22  POC HgB A1c  Result Value Ref Range   Hemoglobin A1C 5.4 4.0 - 5.6 %   HbA1c POC (<> result, manual entry)     HbA1c, POC (prediabetic range)     HbA1c, POC (controlled diabetic range)        The 10-year ASCVD risk score (Arnett DK, et al., 2019) is: 26.7%    Assessment & Plan:   Problem List Items Addressed This Visit       Cardiovascular and Mediastinum   Essential hypertension     Respiratory   Moderate persistent asthma without complication   OSA (obstructive sleep apnea)  Endocrine   Diabetes mellitus type 2 in obese ([place]) - Primary   Relevant Orders   POC HgB A1c (Completed)   BP well-controlled on current regimen.  Continue current BP meds telmisartan 40 mg and Bystolic 5 mg.  Patient requested refills on Bystolic but it appears she has several remaining with mail order pharmacy.  Continue Wixela as asthma control.  A1c 5.4% this visit.  Previously 6.0% on 03/22/2022.  Continue Trulicity and Jardiance.  Continue follow-up with weight management clinic.  Return in about 6 months (around 03/22/2023).    Billie Ruddy, MD

## 2022-09-30 NOTE — Progress Notes (Signed)
Chief Complaint:   OBESITY Suzanne Conner is here to discuss her progress with her obesity treatment plan along with follow-up of her obesity related diagnoses. Suzanne Conner is on the Category 1 Plan and states she is following her eating plan approximately 0% of the time. Suzanne Conner states she is 3,000-4,000 steps daily.  Today's visit was #: 8 Starting weight: 197 lbs Starting date: 03/22/2022 Today's weight: 189 lbs Today's date: 09/19/2022 Total lbs lost to date: 8 Total lbs lost since last in-office visit: 1  Interim History: Suzanne Conner is down 1 lb since her last visit over the holidays.  Subjective:   1. Other disorder of eating Suzanne Conner is taking Topamax.  2. Diabetes mellitus type 2 in obese [facility]) Suzanne Conner is taking Trulicity and [place].  3. Polyphagia Suzanne Conner has increased her water, protein, and fiber even with the injection.  Assessment/Plan:   1. Other disorder of eating Continue to take Topamax daily with supper at 6:30 pm.  Refill- topiramate (TOPAMAX) 50 MG tablet; Take 1 tablet (50 mg total) by mouth daily. With supper daily  Dispense: 30 tablet; Refill: 0  2. Diabetes mellitus type 2 in obese [facility]) Continue current treatment plan.  Refill- Dulaglutide (TRULICITY) 4.5 TM/1.9QQ SOPN; Inject 4.5 mg as directed once a week.  Dispense: 2 mL; Refill: 0  3. Polyphagia Continue to increase water, protein, and fiber.  Chia seeds!  4. Obesity, Current BMI 34.7 Suzanne Conner is currently in the action stage of change. As such, her goal is to continue with weight loss efforts. She has agreed to the Category 1 Plan.   Meal planning. Intentional eating.  Exercise goals:  Continue walking.  Behavioral modification strategies: increasing lean protein intake, decreasing simple carbohydrates, increasing vegetables, increasing water intake, decreasing eating out, no skipping meals, meal planning and cooking strategies, keeping healthy foods in the home, and planning for  success.  Suzanne Conner has agreed to follow-up with our clinic in 4 weeks, fasting for labs. She was informed of the importance of frequent follow-up visits to maximize her success with intensive lifestyle modifications for her multiple health conditions.   Objective:   Blood pressure 131/78, height '5\' 2"'$  (1.575 m), weight 189 lb (85.7 kg). Body mass index is 34.57 kg/m.  General: Cooperative, alert, well developed, in no acute distress. HEENT: Conjunctivae and lids unremarkable. Cardiovascular: Regular rhythm.  Lungs: Normal work of breathing. Neurologic: No focal deficits.   Lab Results  Component Value Date   CREATININE 0.69 02/20/2022   BUN 14 02/20/2022   NA 142 02/20/2022   K 4.4 02/20/2022   CL 104 02/20/2022   CO2 25 02/20/2022   Lab Results  Component Value Date   ALT 18 05/11/2021   AST 19 05/11/2021   ALKPHOS 65 05/11/2021   BILITOT 0.6 05/11/2021   Lab Results  Component Value Date   HGBA1C 5.4 09/21/2022   HGBA1C 6.0 (H) 03/22/2022   HGBA1C 5.8 (A) 11/16/2021   HGBA1C 5.9 05/11/2021   HGBA1C 5.6 03/18/2020   Lab Results  Component Value Date   INSULIN 9.6 03/22/2022   Lab Results  Component Value Date   TSH 0.889 02/20/2022   Lab Results  Component Value Date   CHOL 133 03/22/2022   HDL 58 03/22/2022   LDLCALC 62 03/22/2022   TRIG 65 03/22/2022   CHOLHDL 2 05/11/2021   Lab Results  Component Value Date   VD25OH 80.2 03/22/2022   Lab Results  Component Value Date   WBC 7.9 05/11/2021  HGB 13.3 05/11/2021   HCT 40.1 05/11/2021   MCV 85.1 05/11/2021   PLT 292.0 05/11/2021   Attestation Statements:   Reviewed by clinician on day of visit: allergies, medications, problem list, medical history, surgical history, family history, social history, and previous encounter notes.  I, Kathlene November, BS, CMA, am acting as transcriptionist for [employer], DO.  I have reviewed the above documentation for accuracy and completeness, and I agree with  the above. Jearld Lesch, DO

## 2022-10-02 ENCOUNTER — Telehealth: Payer: Self-pay | Admitting: Physician Assistant

## 2022-10-02 ENCOUNTER — Other Ambulatory Visit: Payer: Self-pay | Admitting: *Deleted

## 2022-10-02 MED ORDER — NEBIVOLOL HCL 5 MG PO TABS: 5.0000 mg | ORAL_TABLET | Freq: Every day | ORAL | 0 refills | Status: DC

## 2022-10-02 NOTE — Telephone Encounter (Signed)
*  STAT* If patient is at the pharmacy, call can be transferred to refill team.   1. Which medications need to be refilled? (please list name of each medication and dose if known)   nebivolol (BYSTOLIC) 5 MG tablet    2. Which pharmacy/location (including street and city if local pharmacy) is medication to be sent to? [facility] [place], [place]    3. Do they need a 30 day or 90 day supply? 90 days  [facility] pt, [facility] for this medication. Also, pt is almost out of meds need emergency refill sent to local pharmacy, [id number] [address]  for 2 weeks supply

## 2022-10-03 ENCOUNTER — Other Ambulatory Visit: Payer: Self-pay | Admitting: *Deleted

## 2022-10-03 DIAGNOSIS — H401132 Primary open-angle glaucoma, bilateral, moderate stage: Secondary | ICD-10-CM | POA: Diagnosis not present

## 2022-10-03 MED ORDER — NEBIVOLOL HCL 5 MG PO TABS: 5.0000 mg | ORAL_TABLET | Freq: Every day | ORAL | 1 refills | Status: DC

## 2022-10-08 ENCOUNTER — Other Ambulatory Visit: Payer: Self-pay | Admitting: Internal Medicine

## 2022-10-11 ENCOUNTER — Telehealth (INDEPENDENT_AMBULATORY_CARE_PROVIDER_SITE_OTHER): Payer: Self-pay | Admitting: Bariatrics

## 2022-10-11 NOTE — Telephone Encounter (Signed)
Patient stated the pharmacy can not fill medication due to it not being sent over after her appointment. The Trulicity 4.5, she needs a 3 month supply. Please call patient to discuss and confirm that it has been sent.

## 2022-10-11 NOTE — Telephone Encounter (Signed)
Spoke to patient and informed her per Dr. Owens Shark to continue medication and we will refill at next follow-up. Patient verbalized understanding

## 2022-10-17 ENCOUNTER — Ambulatory Visit: Payer: Medicare Other | Admitting: Bariatrics

## 2022-10-18 ENCOUNTER — Encounter: Payer: Self-pay | Admitting: Bariatrics

## 2022-10-18 ENCOUNTER — Ambulatory Visit (INDEPENDENT_AMBULATORY_CARE_PROVIDER_SITE_OTHER): Payer: Medicare Other | Admitting: Bariatrics

## 2022-10-18 VITALS — BP 131/71 | HR 71 | Temp 97.8°F | Ht 62.0 in | Wt 189.0 lb

## 2022-10-18 DIAGNOSIS — Z6834 Body mass index (BMI) 34.0-34.9, adult: Secondary | ICD-10-CM

## 2022-10-18 DIAGNOSIS — F5089 Other specified eating disorder: Secondary | ICD-10-CM

## 2022-10-18 DIAGNOSIS — Z7985 Long-term (current) use of injectable non-insulin antidiabetic drugs: Secondary | ICD-10-CM

## 2022-10-18 DIAGNOSIS — E669 Obesity, unspecified: Secondary | ICD-10-CM

## 2022-10-18 DIAGNOSIS — R632 Polyphagia: Secondary | ICD-10-CM

## 2022-10-18 DIAGNOSIS — E1169 Type 2 diabetes mellitus with other specified complication: Secondary | ICD-10-CM | POA: Diagnosis not present

## 2022-10-18 DIAGNOSIS — Z6832 Body mass index (BMI) 32.0-32.9, adult: Secondary | ICD-10-CM | POA: Insufficient documentation

## 2022-10-18 MED ORDER — TOPIRAMATE 50 MG PO TABS: 50.0000 mg | ORAL_TABLET | Freq: Two times a day (BID) | ORAL | 0 refills | Status: DC

## 2022-10-18 MED ORDER — TRULICITY 4.5 MG/0.5ML ~~LOC~~ SOAJ: 4.5000 mg | SUBCUTANEOUS | 0 refills | Status: DC

## 2022-10-22 ENCOUNTER — Telehealth (INDEPENDENT_AMBULATORY_CARE_PROVIDER_SITE_OTHER): Payer: Self-pay | Admitting: Bariatrics

## 2022-10-22 NOTE — Telephone Encounter (Signed)
[facility] called and stated on the Topiramate 50 MG, the directions are not clear. This request must be submitted within 2 business days in order to be processed. Please call [phone]. Reference number is [id number]

## 2022-10-22 NOTE — Telephone Encounter (Signed)
[facility] and verified prescription. Pharmacist verbalized understanding.

## 2022-10-24 ENCOUNTER — Other Ambulatory Visit (INDEPENDENT_AMBULATORY_CARE_PROVIDER_SITE_OTHER): Payer: Self-pay | Admitting: Bariatrics

## 2022-10-24 ENCOUNTER — Telehealth (INDEPENDENT_AMBULATORY_CARE_PROVIDER_SITE_OTHER): Payer: Self-pay | Admitting: Family Medicine

## 2022-10-24 ENCOUNTER — Encounter: Payer: Self-pay | Admitting: Bariatrics

## 2022-10-24 ENCOUNTER — Telehealth (INDEPENDENT_AMBULATORY_CARE_PROVIDER_SITE_OTHER): Payer: Self-pay | Admitting: Bariatrics

## 2022-10-24 DIAGNOSIS — E1169 Type 2 diabetes mellitus with other specified complication: Secondary | ICD-10-CM

## 2022-10-24 MED ORDER — TRULICITY 4.5 MG/0.5ML ~~LOC~~ SOAJ: 4.5000 mg | SUBCUTANEOUS | 0 refills | Status: DC

## 2022-10-24 NOTE — Telephone Encounter (Signed)
2/14 [facility] stated that [facility] not [facility] Truility 4.5 in stock for a while and Patient wanted to know if Dr. Owens Shark could send her medication in a pharamacy. She also would like a 55monthsupply. JE  Pharamacy [facility] [address]

## 2022-11-01 NOTE — Progress Notes (Signed)
Chief Complaint:   OBESITY Suzanne Conner is here to discuss her progress with her obesity treatment plan along with follow-up of her obesity related diagnoses. Suzanne Conner is on the Category 1 plan and states she is following her eating plan approximately 50% of the time. Suzanne Conner states she has not been exercising.  Today's visit was #: 9 Starting weight: 197 lbs Starting date: 03/22/22 Today's weight: 189 lbs Today's date: 10/18/22 Total lbs lost to date: 8 Total lbs lost since last in-office visit: 0  Interim History: Her weight remains the same.  She states that she still struggles.  Subjective:   1. Diabetes mellitus type 2 in obese [facility]) Patient states Trulicity is helping with hemoglobin A1c.  Taking as directed.  2. Other disorder of eating Taking topiramate as directed.  3. Polyphagia Focus on physical/emotional eating.  Assessment/Plan:   1. Diabetes mellitus type 2 in obese (HCC) Refill:  Trulicity 4.5 mg as directed once weekly, 6 mL, no refill.  2. Other disorder of eating Refill: - topiramate (TOPAMAX) 50 MG tablet; Take 1 tablet (50 mg total) by mouth 2 (two) times daily. With supper daily  Dispense: 180 tablet; Refill: 0  3. Polyphagia 1.  Continue medications. 2.  Will watch portion control. 3.  Increase raw vegetables.  4. Generalized obesity, BMI 34.0-34.9,adult 1.  Meal planning. 2.  Intentional eating.  Suzanne Conner is currently in the action stage of change. As such, her goal is to continue with weight loss efforts. She has agreed to the Category 1 plan.  Exercise goals: Moving more.  Behavioral modification strategies: increasing lean protein intake, decreasing simple carbohydrates, increasing vegetables, increasing water intake, decreasing eating out, no skipping meals, meal planning and cooking strategies, keeping healthy foods in the home, and planning for success.  Suzanne Conner has agreed to follow-up with our clinic in 4 weeks. She was informed of the  importance of frequent follow-up visits to maximize her success with intensive lifestyle modifications for her multiple health conditions.    Objective:   Blood pressure 131/71, pulse 71, temperature 97.8 F (36.6 C), height '5\' 2"'$  (1.575 m), weight 189 lb (85.7 kg), SpO2 100 %. Body mass index is 34.57 kg/m.  General: Cooperative, alert, well developed, in no acute distress. HEENT: Conjunctivae and lids unremarkable. Cardiovascular: Regular rhythm.  Lungs: Normal work of breathing. Neurologic: No focal deficits.   Lab Results  Component Value Date   CREATININE 0.69 02/20/2022   BUN 14 02/20/2022   NA 142 02/20/2022   K 4.4 02/20/2022   CL 104 02/20/2022   CO2 25 02/20/2022   Lab Results  Component Value Date   ALT 18 05/11/2021   AST 19 05/11/2021   ALKPHOS 65 05/11/2021   BILITOT 0.6 05/11/2021   Lab Results  Component Value Date   HGBA1C 5.4 09/21/2022   HGBA1C 6.0 (H) 03/22/2022   HGBA1C 5.8 (A) 11/16/2021   HGBA1C 5.9 05/11/2021   HGBA1C 5.6 03/18/2020   Lab Results  Component Value Date   INSULIN 9.6 03/22/2022   Lab Results  Component Value Date   TSH 0.889 02/20/2022   Lab Results  Component Value Date   CHOL 133 03/22/2022   HDL 58 03/22/2022   LDLCALC 62 03/22/2022   TRIG 65 03/22/2022   CHOLHDL 2 05/11/2021   Lab Results  Component Value Date   VD25OH 80.2 03/22/2022   Lab Results  Component Value Date   WBC 7.9 05/11/2021   HGB 13.3 05/11/2021   HCT  40.1 05/11/2021   MCV 85.1 05/11/2021   PLT 292.0 05/11/2021   No results found for: "IRON", "TIBC", "FERRITIN"  Attestation Statements:   Reviewed by clinician on day of visit: allergies, medications, problem list, medical history, surgical history, family history, social history, and previous encounter notes.  I, Dawn Whitmire, FNP-C, am acting as transcriptionist for Dr. Jearld Lesch.  I have reviewed the above documentation for accuracy and completeness, and I agree with the above.  Jearld Lesch, DO

## 2022-11-02 ENCOUNTER — Ambulatory Visit (INDEPENDENT_AMBULATORY_CARE_PROVIDER_SITE_OTHER): Payer: Medicare Other | Admitting: Pulmonary Disease

## 2022-11-02 ENCOUNTER — Encounter (HOSPITAL_BASED_OUTPATIENT_CLINIC_OR_DEPARTMENT_OTHER): Payer: Self-pay | Admitting: Pulmonary Disease

## 2022-11-02 VITALS — BP 124/60 | HR 72 | Ht 62.0 in | Wt 193.0 lb

## 2022-11-02 DIAGNOSIS — G4733 Obstructive sleep apnea (adult) (pediatric): Secondary | ICD-10-CM | POA: Diagnosis not present

## 2022-11-02 DIAGNOSIS — J4520 Mild intermittent asthma, uncomplicated: Secondary | ICD-10-CM

## 2022-11-02 MED ORDER — ALBUTEROL SULFATE HFA 108 (90 BASE) MCG/ACT IN AERS: 2.0000 | INHALATION_SPRAY | Freq: Four times a day (QID) | RESPIRATORY_TRACT | 2 refills | Status: DC | PRN

## 2022-11-02 MED ORDER — FLUTICASONE-SALMETEROL 500-50 MCG/ACT IN AEPB: INHALATION_SPRAY | RESPIRATORY_TRACT | 3 refills | Status: DC

## 2022-11-02 NOTE — Patient Instructions (Addendum)
  Mild OSA The natural history, progression and prognosis of sleep apnea, treatment with PAP and alternative treatment strategies were discussed. The patient was also educated regarding the long term cardiovascular benefits of treating sleep apnea, including improved blood pressure control, reduction in MI and stroke risk as well as other potential benefits of treatment, such as improved glycemic control, facilitation of weight loss, improved energy during the day and improved sleep quality.  --Patient uses NIV for more than four hours nightly for at least 70% of nights during the last three months of usage. The patient has been using and benefiting from PAP use and will continue to benefit from therapy.  --REFER for mask desensitization --Counseled on sleep hygiene --Counseled on weight loss/maintenance of healthy weight --Counseled NOT to drive if/when sleepy  Severe persistent asthma - improved symptoms - CONTINUE Wixela Diskus 500-50 ONE puff TWICE a day. REFILL - CONTINUE Albuterol TWO puffs as needed for shortness of breath or wheezing   Asthma action plan Use albuterol for worsening shortness of breath, wheezing and cough. If you symptoms do not improve in 24-48 hours, please our office for evaluation and/or prednisone taper.  Follow-up with me in 6 months with PFTs prior to visit

## 2022-11-02 NOTE — Progress Notes (Signed)
Subjective:   PATIENT ID: Suzanne Conner GENDER: female DOB: 23-Nov-1945, MRN: [id number]   HPI  Chief Complaint  Patient presents with   Follow-up    Cpap compliance    Reason for Visit: Follow-up  Suzanne Conner is a 77 year old female never smoker with childhood asthma, diabetes (diet controlled), hypertension and GERD who presents for follow-up  Synopsis:  She was previously seen by a Pulmonary doctor in [place] 22 years ago for sarcoid diagnosed via bronchoscopy. She is unable to recall any prolonged treatment. Has not had any respiratory issues since then. Was only seen for ~1 year and reports CXR improved during that time which is suggestive Lofgren syndrome. Denies skin involvement except for seborrheic keratosis. Sees an ophthalmologist for high optic pressures.   On initial consultation had shortness of breath and wheezing x 2 months. She lives in a two story home and now having difficulty walking up.   2022 - Improved symptoms on medium dose Advair however still not much stamina since the pandemic. Completed Pulm Rehab 2023 - Persistent shortness of breath. Reported frequent nighttime awakenings and am headaches. Dx with mild OSA and started on CPAP. Improved asthma symptoms on Wixela 500  11/02/22 Since our last visit she is on high dose Advair. She is using albuterol 1-2 times month. She continues to have shortness of breath with moderate exertion. Denies wheezing or coughing. Compliant with CPAP nightly, unsure of improved dypsnea but does have improved quality of sleep. No longer have nocturnal awakenings. Has used mouth tape while on her nasal prongs.  Asthma Control Test ACT Total Score  02/22/2022 10:40 AM 17  01/04/2021  2:14 PM 16   Review of Systems  Constitutional:  Negative for chills, diaphoresis, fever, malaise/fatigue and weight loss.  HENT:  Negative for congestion.   Respiratory:  Positive for shortness of breath. Negative for cough, hemoptysis,  sputum production and wheezing.   Cardiovascular:  Negative for chest pain, palpitations and leg swelling.   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 Husbands health worsening No pets  Past Medical History:  Diagnosis Date   Arthritis    "left hip; right wrist" (06/12/2018)   Asthma    Depression    Family history of polyps in the colon    GERD (gastroesophageal reflux disease)    Glaucoma    Hypertension    Paroxysmal SVT (supraventricular tachycardia) 03/23/2022   2 Supraventricular Tachycardia runs occurred, the run with the fastest interval lasting 19 beats with a max rate of 218 bpm    Type 2 diabetes, diet controlled (HCC)      Allergies  Allergen Reactions   Paxil [Paroxetine]     Paxil 20 mg caused a tremor.   Penicillins Hives and Other (See Comments)    PATIENT HAS HAD A PCN REACTION WITH IMMEDIATE RASH, FACIAL/TONGUE/THROAT SWELLING, SOB, OR LIGHTHEADEDNESS WITH HYPOTENSION:  #  #  YES  #  #  INCLUDING HIVES AS A CHILD Has patient had a PCN reaction causing severe rash involving mucus membranes or skin necrosis: No Has patient had a PCN reaction that required hospitalization: No Has patient had a PCN reaction occurring within the last 10 years: No If all of the above answers are "NO", then may proceed with Cephalosporin use. PATIENT HAS HAD A PCN REACTION WITH IMMEDIATE RASH, FACIAL/TONGUE/THROAT SWELLING, SOB, OR LIGHTHEADEDNESS WITH HYPOTENSION:  #  #  YES  #  #  INCLUDING HIVES AS A CHILD Has patient had a  PCN reaction causing severe rash involving mucus membranes or skin necrosis: No Has patient had a PCN reaction that required hospitalization: No Has patient had a PCN reaction occurring within the last 10 years: No If all of the above answers are "NO", then may proceed with Cephalosporin use.    Atorvastatin     Joint pain on '40mg'$    Simvastatin     Joint pain on '20mg'$  daily     Outpatient Medications Prior to Visit  Medication Sig Dispense Refill    albuterol (VENTOLIN HFA) 108 (90 Base) MCG/ACT inhaler Inhale 2 puffs into the lungs every 6 (six) hours as needed for wheezing or shortness of breath. 3 each 2   aspirin EC 81 MG tablet Take 81 mg by mouth daily.     Cholecalciferol (VITAMIN D) 2000 units CAPS Take 2,000 Units by mouth daily.     clindamycin (CLEOCIN) 300 MG capsule Take 300 mg by mouth 2 (two) times daily. Prior to dental appointments     Clobetasol Propionate 0.05 % shampoo Use a small amount on the affected areas twice a day as needed. 118 mL 0   Dulaglutide (TRULICITY) 4.5 0000000 SOPN Inject 4.5 mg as directed once a week. 6 mL 0   empagliflozin (JARDIANCE) 10 MG TABS tablet Take 1 tablet (10 mg total) by mouth daily before breakfast. 90 tablet 3   fexofenadine (ALLEGRA) 180 MG tablet Take 1 tablet (180 mg total) by mouth daily. 30 tablet 3   furosemide (LASIX) 20 MG tablet Take 1 tablet (20 mg total) by mouth 2 (two) times daily. 180 tablet 3   hydrocortisone valerate cream (WESTCORT) 0.2 % Apply 1 application topically 2 (two) times daily. 45 g 0   latanoprost (XALATAN) 0.005 % ophthalmic solution Place 1 drop into both eyes at bedtime.     Melatonin 10 MG TABS Take 1 tablet by mouth at bedtime as needed.     Multiple Vitamins-Minerals (CENTRUM SILVER PO) Take 1 tablet by mouth daily.      nebivolol (BYSTOLIC) 5 MG tablet Take 1 tablet (5 mg total) by mouth daily. 90 tablet 1   omeprazole (PRILOSEC) 40 MG capsule TAKE 1 CAPSULE TWICE A DAY 180 capsule 3   OPTIVE 0.5-0.9 % ophthalmic solution Place 1 drop into both eyes 2 (two) times daily.     Probiotic Product (TRUNATURE DIGESTIVE PROBIOTIC) CAPS Take 1 capsule by mouth daily.      RESTASIS 0.05 % ophthalmic emulsion Place 1 drop into both eyes in the morning and at bedtime.     rosuvastatin (CRESTOR) 5 MG tablet Take 1 tablet (5 mg total) by mouth daily. 90 tablet 3   telmisartan (MICARDIS) 40 MG tablet TAKE 1 TABLET DAILY 90 tablet 2   topiramate (TOPAMAX) 50 MG  tablet Take 1 tablet (50 mg total) by mouth 2 (two) times daily. With supper daily 180 tablet 0   WIXELA INHUB 500-50 MCG/ACT AEPB USE 1 INHALATION IN THE MORNING AND AT BEDTIME 180 each 3   No facility-administered medications prior to visit.    Objective:   Vitals:   11/02/22 1119  BP: 124/60  Pulse: 72  SpO2: 97%  Weight: 193 lb (87.5 kg)  Height: '5\' 2"'$  (1.575 m)   SpO2: 97 % O2 Device: None (Room air)  Physical Exam: General: Well-appearing, no acute distress HENT: [place], AT Eyes: EOMI, no scleral icterus Respiratory: Clear to auscultation bilaterally.  No crackles, wheezing or rales Cardiovascular: RRR, -M/R/G, no JVD Extremities:-Edema,-tenderness Neuro:  AAO x4, CNII-XII grossly intact Psych: Normal mood, normal affect  Data Reviewed:  Imaging: CXR 07/12/2020-no infiltrate, effusion or edema. CT Coronary 03/20/22 - Visualized lung parenchyma with no pulmonary nodules, masses, infiltrate, effusion or pneumothorax. Large hiatal hernia  PFT: 01/04/21 FVC 1.49 (62%) FEV1 1.10 (59%) Ratio 73  TLC 63% DLCO 62% Interpretation: Mixed obstructive and restrictive defect with mildly reduced DLCO. No significant bronchodilator response however does not preclude benefit of bronchodilators  05/04/22 FVC 1.36(45%) FEV1 1.06(45%) Ratio 74  TLC 63% DLCO 58% Interpretation: Mixed obstructive and restrictive defect with mildly reduced DLCO. Significant FEV1 bronchodilator response. Compared to 2022, reduced FEV1.  Labs: CBC    Component Value Date/Time   WBC 7.9 05/11/2021 1019   RBC 4.71 05/11/2021 1019   HGB 13.3 05/11/2021 1019   HCT 40.1 05/11/2021 1019   PLT 292.0 05/11/2021 1019   MCV 85.1 05/11/2021 1019   MCH 27.7 03/18/2020 1135   MCHC 33.2 05/11/2021 1019   RDW 14.3 05/11/2021 1019   LYMPHSABS 2.9 05/11/2021 1019   MONOABS 0.7 05/11/2021 1019   EOSABS 0.1 05/11/2021 1019   BASOSABS 0.0 05/11/2021 1019   Absolute eos  03/18/20-70 05/11/21 - 100  Sleep: HST 04/19/22  - Mild OSA. AHI 6.7. Nadir SpO2 77%. Mean SpO2 92%  CPAP compliance 10/03/22 - 11/01/22 >4 hours 30/30 days <4 hours 29 days (97%)      Assessment & Plan:   Discussion:  77 year old female never smoker with severe asthma, pulmonary sarcoid/Lofgren's, OSA, HTN, DM2 who presents for follow-up. Well-controlled asthma on high dose ICS/LABA. Does continue to have shortness of breath. She is compliant with her CPAP nightly >4 hours in the last month. Reports needing to tape her mouth with CPAP.   Mild OSA The natural history, progression and prognosis of sleep apnea, treatment with PAP and alternative treatment strategies were discussed. The patient was also educated regarding the long term cardiovascular benefits of treating sleep apnea, including improved blood pressure control, reduction in MI and stroke risk as well as other potential benefits of treatment, such as improved glycemic control, facilitation of weight loss, improved energy during the day and improved sleep quality.  --Patient uses NIV for more than four hours nightly for at least 70% of nights during the last three months of usage. The patient has been using and benefiting from PAP use and will continue to benefit from therapy.  --REFER for mask desensitization --Counseled on sleep hygiene --Counseled on weight loss/maintenance of healthy weight --Counseled NOT to drive if/when sleepy  Severe persistent asthma - improved symptoms - CONTINUE Wixela Diskus 500-50 ONE puff TWICE a day. REFILL - CONTINUE Albuterol TWO puffs as needed for shortness of breath or wheezing   Asthma action plan Use albuterol for worsening shortness of breath, wheezing and cough. If you symptoms do not improve in 24-48 hours, please our office for evaluation and/or prednisone taper.   Pulmonary sarcoidosis - Dx in 1999 via endobronchial bx. No records available - Hx of resolution in <1 year suggestive of Lofgren - No indication for prednisone therapy  for now - Annual PFTs. Due 04/2023 - Recommend annual ophthalmology exam. [facility] 02/22/22 - no sarcoid invovlement - Reviewed EKG. Prolonged P-R interval. Due at next visit. - Plan for repeat PFTs in 6-12 months pending clinical improvement (04/2023)   Health Maintenance Immunization History  Administered Date(s) Administered   Fluad Quad(high Dose 65+) 06/22/2020, 07/06/2022   Influenza, High Dose Seasonal PF 05/12/2019, 06/10/2021  Influenza-Unspecified 05/29/2018, 05/12/2019   PFIZER(Purple Top)SARS-COV-2 Vaccination 10/01/2019, 10/22/2019, 06/17/2020, 12/08/2020   Pfizer Covid-19 Vaccine Bivalent Booster 6yr & up 07/06/2022   Pneumococcal Polysaccharide-23 06/13/2018   Pneumococcal-Unspecified 07/06/2022   RSV,unspecified 07/06/2022   Zoster Recombinat (Shingrix) 03/10/2019, 05/12/2019   Zoster, Live 04/27/2008   CT Lung Screen - not indicated  Orders Placed This Encounter  Procedures   Pulmonary function test    Standing Status:   Future    Standing Expiration Date:   11/03/2023    Scheduling Instructions:     Completed in 664mo  Order Specific Question:   Where should this test be performed?    Answer:   [facility]    Order Specific Question:   Full PFT: includes the following: basic spirometry, spirometry pre & post bronchodilator, diffusion capacity (DLCO), lung volumes    Answer:   Full PFT   Desensitization mask fit    Standing Status:   Future    Standing Expiration Date:   11/03/2023    Order Specific Question:   Where should this test be performed:    Answer[facility]   [facility] Desensitization mask fit    Standing Status:   Future    Standing Expiration Date:   11/03/2023    Order Specific Question:   Where should this test be performed:    Answer:   [facility] ordered this encounter  Medications   fluticasone-salmeterol (WIXELA INHUB) 500-50 MCG/ACT AEPB    Sig: USE 1 INHALATION IN THE MORNING AND AT  BEDTIME    Dispense:  180 each    Refill:  3   albuterol (VENTOLIN HFA) 108 (90 Base) MCG/ACT inhaler    Sig: Inhale 2 puffs into the lungs every 6 (six) hours as needed for wheezing or shortness of breath.    Dispense:  3 each    Refill:  2    Provide 3 month supply.    Return in about 6 months (around 05/03/2023). Sarcoid review at next visit  I have spent a total time of 32-minutes on the day of the appointment including chart review, data review, collecting history, coordinating care and discussing medical diagnosis and plan with the patient/family. Past medical history, allergies, medications were reviewed. Pertinent imaging, labs and tests included in this note have been reviewed and interpreted independently by me.  [facility] 11/02/2022  Office Number [phone]

## 2022-11-05 ENCOUNTER — Encounter (HOSPITAL_BASED_OUTPATIENT_CLINIC_OR_DEPARTMENT_OTHER): Payer: Self-pay | Admitting: Pulmonary Disease

## 2022-11-06 ENCOUNTER — Encounter: Payer: Self-pay | Admitting: Bariatrics

## 2022-11-06 NOTE — Progress Notes (Signed)
Cardiology Office Note:    Date:  11/12/2022   ID:  Suzanne Conner, DOB 1945-09-17, MRN [id number]  PCP:  Suzanne Ruddy, MD   [facility] Providers Cardiologist:  Suzanne Sciara, MD Referring MD: Suzanne Ruddy, MD   Chief Complaint/Reason for Referral: Cardiology follow-up  ASSESSMENT:    1. Dyspnea on exertion   2. Essential hypertension   3. Hyperlipidemia LDL goal <70   4. Coronary artery disease involving native coronary artery of native heart without angina pectoris   5. BMI 35.0-35.9,adult     PLAN:    In order of problems listed above: 1.  Dyspnea: She has had a fairly complete cardiac evaluation which demonstrates no significant cardiac pathology.  In addition to her elevated BMI, she has mild sleep apnea, severe persistent asthma, and possibly pulmonary sarcoidosis.  Will obtain a CMR to evaluate for cardiac involvement and I believe most of her issues are due to body habitus.  Given lack of effect with lasix, will stop lasix.   2.  Hypertension:  Blood pressure is well controlled.  Will change Bystolic to Qbedtime due to headaches. 3.  Hyperlipidemia: Check lipid panel, LFTs, and LP(a) today. 4.  Coronary artery disease: Mild on coronary CTA; continue aspirin, statin, and blood pressure control. 5.  Elevated BMI: Patient had seen the pharmacy division and unfortunately Suzanne Conner is not covered by her insurance.  She is very disappointed about this.  I will reach out to pharmacy regarding whether they can rerun her insurance for [facility] or Mounjaro.             Dispo:  Return in about 1 year (around 11/12/2023).      Medication Adjustments/Labs and Tests Ordered: Current medicines are reviewed at length with the patient today.  Concerns regarding medicines are outlined above.  The following changes have been made:     Labs/tests ordered: Orders Placed This Encounter  Procedures   MR CARDIAC MORPHOLOGY W WO CONTRAST   Lipid panel   Lipoprotein A (LPA)    Hepatic function panel   Hemoglobin and hematocrit, blood    Medication Changes: Meds ordered this encounter  Medications   nebivolol (BYSTOLIC) 5 MG tablet    Sig: Take 1 tablet (5 mg total) by mouth at bedtime.    Dispense:  90 tablet    Refill:  3    For future refills. Changed time of day only.     Current medicines are reviewed at length with the patient today.  The patient does not have concerns regarding medicines.   History of Present Illness:    FOCUSED PROBLEM LIST:   1.  Type 2 diabetes mellitus not on insulin 2.  Hypertension 3.  Hyperlipidemia 4.  Mild coronary artery disease on coronary CTA (large hiatal hernia also noted) 2023 5.  BMI 35 6.  Pulmonary sarcoidosis 7.  OSA on CPAP  November 2022 consultation:  Suzanne Conner is a 77 y.o. female with the indicated history patient was seen in October by her primary care provider.  At that time she was endorsing shortness of breath but no exertional chest pain.  Her blood pressure was mildly elevated above goal.  Her metoprolol was stopped because this was thought to be contributing to her symptoms possibly.  She was started on telmisartan.   She tells me that she has been more short of breath over the last several months.  This typically happens happens with exertion.  She has gained about  approximately 20 pounds since the pandemic started.  She has noticed this shortness of breath happening near the end of the pandemic.  She has also had an exacerbation of her childhood asthma.  She is now on medication for this.  She occasionally wheezes but this is pretty rare.  This shortness of breath she is endorsing today is separate from her wheezing.  She denies any significant edema, paroxysmal nocturnal dyspnea, orthopnea, exertional angina, presyncope or syncope.  She has very rare palpitations that last seconds and may be happening once or twice a week.  They are not not associated with lightheadedness, shortness of breath, or  chest pain.  She has required no emergency room visits or hospitalizations.  Plan: Echocardiogram, start Jardiance, start atorvastatin 40 mg daily and empiric lasix.  February 2023:  The patient is a 77 y.o. female with the indicated medical history here for follow-up.  I first saw her in November 2022.  She was complaining of dyspnea.  We started empiric Lasix which helped her breathing.  She was started on Jardiance and atorvastatin as well.   Her breathing is mildly improved with Lasix.  She still feels short of breath with exertion at times.  She denies any chest pain, palpitations, paroxysmal nocturnal dyspnea, orthopnea.  She has required no emergency room visits or hospitalizations.  In terms of her weight she is a stress eater and tells me that her weight is gone up and down.  When reviewing the record she is gained about 10 pounds in the last few years.  Plan: Change atorvastatin to simvastatin and increase Micardis to 40 mg; stop hydrochlorothiazide; refer to pharmacy for elevated BMI  Today: In the interim since I saw her last the patient had a monitor placed which demonstrated rare asymptomatic SVT and no atrial fibrillation.  She was placed on Bystolic.  She is seen pulmonology as well and was started on inhalers.  When she was seen in September a BNP was checked and found to be normal.  It was thought her dyspnea was from a pulmonary component.  She was seen by pulmonology recently was thought to have severe persistent asthma, pulmonary sarcoidosis, and mild obstructive sleep apnea.  The patient continues to struggle with shortness of breath.  She had her asthma medications intensified which has helped.  She has noticed that occasionally when she gets short of breath she does wheeze.  Unfortunately is continues to limit her from losing weight.  She fortunately has not required any emergency room visits or hospitalizations.  She has developed some headaches in response to nebivolol which was  started because of occasional SVT.  She has noticed no palpitations since starting this medication.         Current Medications: Current Meds  Medication Sig   albuterol (VENTOLIN HFA) 108 (90 Base) MCG/ACT inhaler Inhale 2 puffs into the lungs every 6 (six) hours as needed for wheezing or shortness of breath.   aspirin EC 81 MG tablet Take 81 mg by mouth daily.   Cholecalciferol (VITAMIN D) 2000 units CAPS Take 2,000 Units by mouth daily.   clindamycin (CLEOCIN) 300 MG capsule Take 300 mg by mouth 2 (two) times daily. Prior to dental appointments   Clobetasol Propionate 0.05 % shampoo Use a small amount on the affected areas twice a day as needed.   Dulaglutide (TRULICITY) 4.5 0000000 SOPN Inject 4.5 mg as directed once a week.   empagliflozin (JARDIANCE) 10 MG TABS tablet Take 1 tablet (10 mg  total) by mouth daily before breakfast.   fexofenadine (ALLEGRA) 180 MG tablet Take 1 tablet (180 mg total) by mouth daily.   fluticasone-salmeterol (WIXELA INHUB) 500-50 MCG/ACT AEPB USE 1 INHALATION IN THE MORNING AND AT BEDTIME   hydrocortisone valerate cream (WESTCORT) 0.2 % Apply 1 application topically 2 (two) times daily.   latanoprost (XALATAN) 0.005 % ophthalmic solution Place 1 drop into both eyes at bedtime.   Melatonin 10 MG TABS Take 1 tablet by mouth at bedtime as needed.   Multiple Vitamins-Minerals (CENTRUM SILVER PO) Take 1 tablet by mouth daily.    omeprazole (PRILOSEC) 40 MG capsule TAKE 1 CAPSULE TWICE A DAY   OPTIVE 0.5-0.9 % ophthalmic solution Place 1 drop into both eyes 2 (two) times daily.   Probiotic Product (TRUNATURE DIGESTIVE PROBIOTIC) CAPS Take 1 capsule by mouth daily.    RESTASIS 0.05 % ophthalmic emulsion Place 1 drop into both eyes in the morning and at bedtime.   rosuvastatin (CRESTOR) 5 MG tablet Take 1 tablet (5 mg total) by mouth daily.   telmisartan (MICARDIS) 40 MG tablet TAKE 1 TABLET DAILY   [DISCONTINUED] furosemide (LASIX) 20 MG tablet Take 1 tablet (20  mg total) by mouth 2 (two) times daily.   [DISCONTINUED] nebivolol (BYSTOLIC) 5 MG tablet Take 1 tablet (5 mg total) by mouth daily.     Allergies:    Paxil [paroxetine], Penicillins, Atorvastatin, and Simvastatin   Social History:   Social History   Tobacco Use   Smoking status: Never   Smokeless tobacco: Never  Vaping Use   Vaping Use: Never used  Substance Use Topics   Alcohol use: Yes    Alcohol/week: 5.0 standard drinks of alcohol    Types: 5 Glasses of wine per week   Drug use: Never    Comment: 06/12/2018 CBD oil for pain     Family Hx: Family History  Problem Relation Age of Onset   Asthma Brother    Diabetes Brother    Dementia Mother    Cancer Father    Heart disease Sister    Diabetes Brother    Diabetes Brother    Healthy Son    Healthy Daughter      Review of Systems:   Please see the history of present illness.    All other systems reviewed and are negative.     EKGs/Labs/Other Test Reviewed:    EKG:  EKG performed September 2023 that I personally reviewed demonstrates normal sinus rhythm.  Prior CV studies:  Monitor 2023: -2 Supraventricular Tachycardia runs occurred, the run with the fastest interval lasting 19 beats with a max rate of 218 bpm (avg 176 bpm); the run with the fastest interval was also the longest.    -Isolated SVEs were rare (<1.0%), SVE Couplets were rare (<1.0%), and SVE Triplets were rare (<1.0%). Isolated VEs were rare (<1.0%), VE Couplets were rare (<1.0%), and no VE Triplets were present. Ventricular Bigeminy and Trigeminy were present.    -No atrial fibrillation, sustained ventricular tachyarrhythmias, or bradyarrhythmias were detected.   -There were no patient triggered events  Coronary CTA 2023: 1. Coronary calcium score of 89. This was 70th percentile for age and sex matched control. 2.  Normal coronary origin with right dominance. 3.  Nonobstructive CAD 4.  Mixed plaque in proximal LAD causes mild (25-49%)  stenosis 5. Noncalcified plaque in left main and proximal RCA causes minimal (0-24%) stenosis 6.  Large hiatal hernia  TTE 2022: 1. Left ventricular ejection fraction, by  estimation, is 60 to 65%. The  left ventricle has normal function. The left ventricle has no regional  wall motion abnormalities. Left ventricular diastolic parameters are  consistent with Grade I diastolic  dysfunction (impaired relaxation).   2. Right ventricular systolic function is normal. The right ventricular  size is normal. Tricuspid regurgitation signal is inadequate for assessing  PA pressure.   3. The mitral valve is normal in structure. Trivial mitral valve  regurgitation. No evidence of mitral stenosis.   4. The aortic valve is tricuspid. There is mild calcification of the  aortic valve. There is mild thickening of the aortic valve. Aortic valve  regurgitation is trivial. Aortic valve sclerosis/calcification is present,  without any evidence of aortic  stenosis.   Other studies Reviewed: Review of the additional studies/records demonstrates:  None of the imaging studies reviewed demonstrates aortic atherosclerosis  Recent Labs: 02/20/2022: BUN 14; Creatinine, Ser 0.69; Potassium 4.4; Sodium 142; TSH 0.889 05/18/2022: NT-Pro BNP 69   Recent Lipid Panel Lab Results  Component Value Date/Time   CHOL 133 03/22/2022 10:20 AM   TRIG 65 03/22/2022 10:20 AM   HDL 58 03/22/2022 10:20 AM   LDLCALC 62 03/22/2022 10:20 AM   LDLCALC 76 03/18/2020 11:35 AM    Risk Assessment/Calculations:                Physical Exam:    VS:  BP 118/80   Pulse 77   Ht '5\' 2"'$  (1.575 m)   Wt 192 lb 9.6 oz (87.4 kg)   SpO2 99%   BMI 35.23 kg/m    Wt Readings from Last 3 Encounters:  11/12/22 192 lb 9.6 oz (87.4 kg)  11/02/22 193 lb (87.5 kg)  10/18/22 189 lb (85.7 kg)    GENERAL:  No apparent distress, AOx3 HEENT:  No carotid bruits, +2 carotid impulses, no scleral icterus CAR: RRR no murmurs, gallops, rubs, or  thrills RES:  Clear to auscultation bilaterally ABD:  Soft, nontender, nondistended, positive bowel sounds x 4 VASC:  +2 radial pulses, +2 carotid pulses, palpable pedal pulses NEURO:  CN 2-12 grossly intact; motor and sensory grossly intact PSYCH:  No active depression or anxiety EXT:  No edema, ecchymosis, or cyanosis  Signed, Early Osmond, MD  11/12/2022 2:40 PM    [address] [place], [place],  [place]  [address] Phone: [phone]; Fax: [phone]   Note:  This document was prepared using Dragon voice recognition software and may include unintentional dictation errors.

## 2022-11-12 ENCOUNTER — Encounter: Payer: Self-pay | Admitting: Internal Medicine

## 2022-11-12 ENCOUNTER — Ambulatory Visit: Payer: Medicare Other | Attending: Internal Medicine | Admitting: Internal Medicine

## 2022-11-12 ENCOUNTER — Telehealth: Payer: Self-pay | Admitting: Pharmacist

## 2022-11-12 VITALS — BP 118/80 | HR 77 | Ht 62.0 in | Wt 192.6 lb

## 2022-11-12 DIAGNOSIS — E785 Hyperlipidemia, unspecified: Secondary | ICD-10-CM

## 2022-11-12 DIAGNOSIS — I2510 Atherosclerotic heart disease of native coronary artery without angina pectoris: Secondary | ICD-10-CM | POA: Diagnosis not present

## 2022-11-12 DIAGNOSIS — I10 Essential (primary) hypertension: Secondary | ICD-10-CM | POA: Insufficient documentation

## 2022-11-12 DIAGNOSIS — R0609 Other forms of dyspnea: Secondary | ICD-10-CM | POA: Diagnosis not present

## 2022-11-12 DIAGNOSIS — Z6835 Body mass index (BMI) 35.0-35.9, adult: Secondary | ICD-10-CM | POA: Diagnosis not present

## 2022-11-12 MED ORDER — NEBIVOLOL HCL 5 MG PO TABS: 5.0000 mg | ORAL_TABLET | Freq: Every day | ORAL | 3 refills | Status: DC

## 2022-11-12 NOTE — Telephone Encounter (Signed)
Pt on Trulicity and wanting to switch to Ozempic or Mounjaro. Pulled up PA requests with [employer] - Trulicity preferred, Ozempic and Mounjaro non preferred. PA form for these GLPs require pt to have tried metformin first and failed to achieve blood sugar control. Spoke with pt, she has never tried metformin, and her A1c is well controlled at 6% so even if we did rx metformin, insurance would then view her DM as well controlled and not approve coverage for Ozempic or Mounjaro. Called pt to to explain this, can try again in future years if her plan criteria changes. She was appreciative for the call.

## 2022-11-12 NOTE — Patient Instructions (Addendum)
Medication Instructions:  Your physician has recommended you make the following change in your medication:  1.) change nebivolol to once daily at bedtime 2.) stop furosemide (Lasix)  *If you need a refill on your cardiac medications before your next appointment, please call your pharmacy*   Lab Work: Today: cbc, lipids, liver function, Lpa  If you have labs (blood work) drawn today and your tests are completely normal, you will receive your results only by: [place] (if you have MyChart) OR A paper copy in the mail If you have any lab test that is abnormal or we need to change your treatment, we will call you to review the results.   Testing/Procedures: Your physician has requested that you have a cardiac MRI. Cardiac MRI uses a computer to create images of your heart as its beating, producing both still and moving pictures of your heart and major blood vessels. For further information please visit [email or web address] Please follow the instruction sheet given to you today for more information.    Follow-Up: At [facility], you and your health needs are our priority.  As part of our continuing mission to provide you with exceptional heart care, we have created designated Provider Care Teams.  These Care Teams include your primary Cardiologist (physician) and Advanced Practice Providers (APPs -  Physician Assistants and Nurse Practitioners) who all work together to provide you with the care you need, when you need it.   Your next appointment:   12 month(s)  Provider:   Advanced Practice Provider (PA or NP)    You are scheduled for Cardiac MRI on ______________. Please arrive for your appointment at ______________ ( arrive 30-45 minutes prior to test start time). ?  [facility] [address] [place], [place] [address] [phone] Please take advantage of the free valet parking available at the MAIN entrance (A entrance).  Proceed to the [facility]  Radiology Department (First Floor) for check-in.   [facility] [place], [place] [address] [phone] Please take advantage of the free valet parking available at the MAIN entrance. Proceed to [facility] registration for check-in (first floor).  Magnetic resonance imaging (MRI) is a painless test that produces images of the inside of the body without using Xrays.  During an MRI, strong magnets and radio waves work together in a Research officer, political party to form detailed images.   MRI images may provide more details about a medical condition than X-rays, CT scans, and ultrasounds can provide.  You may be given earphones to listen for instructions.  You may eat a light breakfast and take medications as ordered with the exception of furosemide, hydrochlorothiazide, or spironolactone(fluid pill, other). Please avoid stimulants for 12 hr prior to test. (Ie. Caffeine, nicotine, chocolate, or antihistamine medications)  If a contrast material will be used, an IV will be inserted into one of your veins. Contrast material will be injected into your IV. It will leave your body through your urine within a day. You may be told to drink plenty of fluids to help flush the contrast material out of your system.  You will be asked to remove all metal, including: Watch, jewelry, and other metal objects including hearing aids, hair pieces and dentures. Also wearable glucose monitoring systems (ie. Freestyle Libre and Omnipods) (Braces and fillings normally are not a problem.)   TEST WILL TAKE APPROXIMATELY 1 HOUR  PLEASE NOTIFY SCHEDULING AT LEAST 24 HOURS IN ADVANCE IF YOU ARE  UNABLE TO KEEP YOUR APPOINTMENT. [phone]  Please call Marchia Bond, cardiac imaging nurse navigator with any questions/concerns. Marchia Bond RN Navigator Cardiac Imaging Gordy Clement RN Navigator Cardiac Imaging [facility] [phone] Office

## 2022-11-14 LAB — HEPATIC FUNCTION PANEL
ALT: 15 IU/L (ref 0–32)
AST: 18 IU/L (ref 0–40)
Albumin: 4 g/dL (ref 3.8–4.8)
Alkaline Phosphatase: 72 IU/L (ref 44–121)
Bilirubin Total: 0.2 mg/dL (ref 0.0–1.2)
Bilirubin, Direct: <0.1 mg/dL (ref 0.00–0.40)
Total Protein: 6.7 g/dL (ref 6.0–8.5)

## 2022-11-14 LAB — LIPID PANEL
Chol/HDL Ratio: 2.2 ratio (ref 0.0–4.4)
Cholesterol, Total: 120 mg/dL (ref 100–199)
HDL: 54 mg/dL (ref >39)
LDL Chol Calc (NIH): 50 mg/dL (ref 0–99)
Triglycerides: 83 mg/dL (ref 0–149)
VLDL Cholesterol Cal: 16 mg/dL (ref 5–40)

## 2022-11-14 LAB — HEMOGLOBIN AND HEMATOCRIT, BLOOD
Hematocrit: 39.3 % (ref 34.0–46.6)
Hemoglobin: 12.7 g/dL (ref 11.1–15.9)

## 2022-11-14 LAB — LIPOPROTEIN A (LPA): Lipoprotein (a): 21.9 nmol/L (ref <75.0)

## 2022-11-19 ENCOUNTER — Encounter: Payer: Self-pay | Admitting: Bariatrics

## 2022-11-19 ENCOUNTER — Ambulatory Visit (HOSPITAL_BASED_OUTPATIENT_CLINIC_OR_DEPARTMENT_OTHER): Payer: Medicare Other | Attending: Pulmonary Disease | Admitting: Radiology

## 2022-11-19 ENCOUNTER — Ambulatory Visit (INDEPENDENT_AMBULATORY_CARE_PROVIDER_SITE_OTHER): Payer: Medicare Other | Admitting: Bariatrics

## 2022-11-19 VITALS — BP 144/74 | HR 78 | Temp 97.9°F | Ht 62.0 in | Wt 189.0 lb

## 2022-11-19 DIAGNOSIS — R632 Polyphagia: Secondary | ICD-10-CM

## 2022-11-19 DIAGNOSIS — E1169 Type 2 diabetes mellitus with other specified complication: Secondary | ICD-10-CM | POA: Diagnosis not present

## 2022-11-19 DIAGNOSIS — E669 Obesity, unspecified: Secondary | ICD-10-CM

## 2022-11-19 DIAGNOSIS — Z7985 Long-term (current) use of injectable non-insulin antidiabetic drugs: Secondary | ICD-10-CM

## 2022-11-19 DIAGNOSIS — Z794 Long term (current) use of insulin: Secondary | ICD-10-CM

## 2022-11-19 DIAGNOSIS — Z6834 Body mass index (BMI) 34.0-34.9, adult: Secondary | ICD-10-CM | POA: Diagnosis not present

## 2022-11-19 DIAGNOSIS — G4733 Obstructive sleep apnea (adult) (pediatric): Secondary | ICD-10-CM

## 2022-11-19 MED ORDER — TIRZEPATIDE 5 MG/0.5ML ~~LOC~~ SOAJ: 5.0000 mg | SUBCUTANEOUS | 0 refills | Status: DC

## 2022-11-19 NOTE — Progress Notes (Addendum)
WEIGHT SUMMARY AND BIOMETRICS  Weight Lost Since Last Visit: 0   Vitals Temp: 97.9 F (36.6 C) BP: (!) 144/74 Pulse Rate: 78 SpO2: 97 %   Anthropometric Measurements Height: '5\' 2"'$  (1.575 m) Weight: 189 lb (85.7 kg) BMI (Calculated): 34.56 Weight at Last Visit: 189lb Weight Lost Since Last Visit: 0 Starting Weight: 197lb Total Weight Loss (lbs): 8 lb (3.629 kg)   Body Composition  Body Fat %: 45.5 % Fat Mass (lbs): 86 lbs Muscle Mass (lbs): 98 lbs Total Body Water (lbs): 66.2 lbs Visceral Fat Rating : 15   Other Clinical Data Fasting: no Labs: no Today's Visit #: 10 Starting Date: 03/22/22    OBESITY Suzanne Conner is here to discuss her progress with her obesity treatment plan along with follow-up of her obesity related diagnoses.     Nutrition Plan: the Category 1 plan - 70% adherence.  Current exercise: walking  Interim History:  Her weight remains the same as her previous visit.  Eating all of the food on the plan., Is not skipping meals, Meeting protein goals., and Reports polyphagia  Pharmacotherapy: Suzanne Conner is on Trulicity 4.5 mg SQ weekly Adverse side effects: Constipation Hunger is moderately controlled.  Cravings are moderately controlled.  Assessment/Plan:  1. Type II Diabetes HgbA1c is at goal. Last A1c was 5.4 Episodes of hypoglycemia: no Medication(s): Trulicity 4.5 mg SQ weekly  Lab Results  Component Value Date   HGBA1C 5.4 09/21/2022   HGBA1C 6.0 (H) 03/22/2022   HGBA1C 5.8 (A) 11/16/2021   Lab Results  Component Value Date   LDLCALC 50 11/12/2022   CREATININE 0.69 02/20/2022   Lab Results  Component Value Date   GFR 87.12 05/11/2021   GFR 84.48 10/05/2020   GFR 121.27 06/17/2019    Plan: Discontinue Trulicity 4.5 mg SQ weekly Rx: Mounjaro 0.5 mg into the skin daily 6 ml with 0 refills.  Continue all other medications.  2.   Will keep all carbohydrates low both sweets and starches.  3.   Will continue exercise  regimen to 30 to 60 minutes on most days of the week.  4.   Aim for 7 to 9 hours of sleep nightly.  5.   Eat more low glycemic index foods.   2. Polyphagia Suzanne Conner endorses excessive hunger.  Medication(s): Suzanne Conner Effects of medication:  moderately controlled. Cravings are moderately controlled.   Plan: Will increase water, protein and fiber to help assuage hunger.  Will minimize foods that have a high glucose index/load to minimize reactive hypoglycemia.    Generalized Obesity: Current BMI: 34  Continue Plan  Pharmacotherapy Plan Discontinue  Trulicity 4.5 mg SQ weekly  Suzanne Conner is not currently in the action stage of change. As such, her goal is to continue with weight loss efforts.  She has agreed to the Category 1 plan.  Exercise goals: Older adults should do exercises that maintain or improve balance if they are at risk of falling.   Behavioral modification strategies: decreasing simple carbohydrates , no meal skipping, increase water intake, and better snacking choices.  Suzanne Conner has agreed to follow-up with our clinic in 4 weeks.   No orders of the defined types were placed in this encounter.   Medications Discontinued During This Encounter  Medication Reason   Dulaglutide (TRULICITY) 4.5 0000000 SOPN      Meds ordered this encounter  Medications   tirzepatide (MOUNJARO) 5 MG/0.5ML Pen    Sig: Inject 5 mg into the skin once a week.  Dispense:  6 mL    Refill:  0      Objective:   VITALS: Per patient if applicable, see vitals. GENERAL: Alert and in no acute distress. CARDIOPULMONARY: No increased WOB. Speaking in clear sentences.  PSYCH: Pleasant and cooperative. Speech normal rate and rhythm. Affect is appropriate. Insight and judgement are appropriate. Attention is focused, linear, and appropriate.  NEURO: Oriented as arrived to appointment on time with no prompting.   Attestation Statements:   This was prepared with the assistance of Educational psychologist.  Occasional wrong-word or sound-a-like substitutions may have occurred due to the inherent limitations of voice recognition software.

## 2022-11-19 NOTE — Progress Notes (Deleted)
   WEIGHT SUMMARY AND BIOMETRICS  No data recorded No data recorded  No data recorded No data recorded No data recorded No data recorded  OBESITY Suzanne Conner is here to discuss her progress with her obesity treatment plan along with follow-up of her obesity related diagnoses.   Nutrition Plan: {dwwsldiets:29085} - ***% adherence.  Current exercise: {exercise types:16438}  Interim History:  *** {aabnutritionassessment:29213}  Pharmacotherapy: Suzanne Conner is on {dwwpharmacotherapy:29109} Adverse side effects: {dwwse:29122} Hunger is {EWCONTROLASSESSMENT:24261}.  Cravings are {EWCONTROLASSESSMENT:24261}.   Assessment/Plan:  1. ***  2. ***  3. ***   {dwwmorbid:29108::"Morbid Obesity"}: Current BMI *** Pharmacotherapy Plan {dwwmed:29123}  {dwwpharmacotherapy:29109}  Suzanne Conner {CHL AMB IS/IS NOT:210130109} currently in the action stage of change. As such, her goal is to {MWMwtloss#1:210800005}.  She has agreed to {dwwsldiets:29085}.  Exercise goals: {MWM EXERCISE RECS:23473}  Behavioral modification strategies: {dwwslwtlossstrategies:29088}.  Suzanne Conner has agreed to follow-up with our clinic in {NUMBER 1-10:22536} weeks.   No orders of the defined types were placed in this encounter.   There are no discontinued medications.   No orders of the defined types were placed in this encounter.     Objective:   VITALS: Per patient if applicable, see vitals. GENERAL: Alert and in no acute distress. CARDIOPULMONARY: No increased WOB. Speaking in clear sentences.  PSYCH: Pleasant and cooperative. Speech normal rate and rhythm. Affect is appropriate. Insight and judgement are appropriate. Attention is focused, linear, and appropriate.  NEURO: Oriented as arrived to appointment on time with no prompting.   Attestation Statements:   ***(delete if time-based billing not used) Time spent on visit including the items listed below was *** minutes.  -preparing to see the  patient (e.g., review of tests, history, previous notes) -obtaining and/or reviewing separately obtained history -counseling and educating the patient/family/caregiver -documenting clinical information in the electronic or other health record -ordering medications, tests, or procedures -independently interpreting results and communicating results to the patient/ family/caregiver -referring and communicating with other health care professionals  -care coordination   This was prepared with the assistance of Presenter, broadcasting.  Occasional wrong-word or sound-a-like substitutions may have occurred due to the inherent limitations of voice recognition software.

## 2022-11-21 ENCOUNTER — Telehealth (INDEPENDENT_AMBULATORY_CARE_PROVIDER_SITE_OTHER): Payer: Self-pay | Admitting: Bariatrics

## 2022-11-21 ENCOUNTER — Telehealth: Payer: Self-pay

## 2022-11-21 NOTE — Telephone Encounter (Signed)
Patient stated she needed a prior authorization. Notified patient I sent a prior authorization to her insurance this morning. Patient verbalized understanding.

## 2022-11-21 NOTE — Telephone Encounter (Signed)
Pt called 11/21/22 saw Dr Owens Shark on 11/19/22.She and Dr Owens Shark talked about her calling back to talk with her nurse to give her the information about  a new medication.

## 2022-11-21 NOTE — Telephone Encounter (Signed)
Started prior [place] for [employer] via fax.

## 2022-11-22 NOTE — Telephone Encounter (Signed)
Mounjaro approved per insurance until 09/09/2098.

## 2022-11-28 ENCOUNTER — Other Ambulatory Visit: Payer: Self-pay | Admitting: Family Medicine

## 2022-11-28 DIAGNOSIS — K219 Gastro-esophageal reflux disease without esophagitis: Secondary | ICD-10-CM

## 2022-12-05 ENCOUNTER — Other Ambulatory Visit: Payer: Self-pay | Admitting: Internal Medicine

## 2022-12-05 MED ORDER — EMPAGLIFLOZIN 10 MG PO TABS: 10.0000 mg | ORAL_TABLET | Freq: Every day | ORAL | 3 refills | Status: DC

## 2022-12-18 ENCOUNTER — Ambulatory Visit (INDEPENDENT_AMBULATORY_CARE_PROVIDER_SITE_OTHER): Payer: Medicare Other | Admitting: Bariatrics

## 2022-12-18 ENCOUNTER — Encounter: Payer: Self-pay | Admitting: Bariatrics

## 2022-12-18 VITALS — BP 122/77 | HR 73 | Temp 97.3°F | Ht 62.0 in | Wt 184.0 lb

## 2022-12-18 DIAGNOSIS — Z7985 Long-term (current) use of injectable non-insulin antidiabetic drugs: Secondary | ICD-10-CM | POA: Diagnosis not present

## 2022-12-18 DIAGNOSIS — E118 Type 2 diabetes mellitus with unspecified complications: Secondary | ICD-10-CM

## 2022-12-18 DIAGNOSIS — E669 Obesity, unspecified: Secondary | ICD-10-CM | POA: Diagnosis not present

## 2022-12-18 DIAGNOSIS — Z6833 Body mass index (BMI) 33.0-33.9, adult: Secondary | ICD-10-CM

## 2022-12-18 DIAGNOSIS — R632 Polyphagia: Secondary | ICD-10-CM

## 2022-12-18 DIAGNOSIS — Z7984 Long term (current) use of oral hypoglycemic drugs: Secondary | ICD-10-CM

## 2022-12-18 NOTE — Progress Notes (Signed)
WEIGHT SUMMARY AND BIOMETRICS  Weight Lost Since Last Visit: 5lb   Vitals Temp: (!) 97.3 F (36.3 C) BP: 122/77 Pulse Rate: 73 SpO2: 99 %   Anthropometric Measurements Height: 5\' 2"  (1.575 m) Weight: 184 lb (83.5 kg) BMI (Calculated): 33.65 Weight at Last Visit: 189lb Weight Lost Since Last Visit: 5lb Weight Gained Since Last Visit: 0 Starting Weight: 197lb Total Weight Loss (lbs): 13 lb (5.897 kg)   Body Composition  Body Fat %: 44.4 % Fat Mass (lbs): 82 lbs Muscle Mass (lbs): 97.6 lbs Total Body Water (lbs): 65.2 lbs Visceral Fat Rating : 14   Other Clinical Data Fasting: no Labs: no Today's Visit #: 11 Starting Date: 03/22/22    OBESITY Suzanne Conner is here to discuss her progress with her obesity treatment plan along with follow-up of her obesity related diagnoses.   Nutrition Plan: the Category 1 plan - 80-85% adherence.  Current exercise: walking  Interim History:  She is down 5 lbs since her last visit. She reports less hunger and cravings on the [facility].  Protein intake is as prescribed and Is not skipping meals  Pharmacotherapy: Suzanne Conner is on Mounjaro 5.0 mg SQ weekly Adverse side effects: Nausea Hunger is moderately controlled.  Cravings are moderately controlled.  Assessment/Plan:   Type II Diabetes HgbA1c is at goal. Last A1c was 5.4 Episodes of hypoglycemia: no Medication(s): Mounjaro 5.0 mg SQ weekly, Jardaince  Lab Results  Component Value Date   HGBA1C 5.4 09/21/2022   HGBA1C 6.0 (H) 03/22/2022   HGBA1C 5.8 (A) 11/16/2021   Lab Results  Component Value Date   LDLCALC 50 11/12/2022   CREATININE 0.69 02/20/2022   Lab Results  Component Value Date   GFR 87.12 05/11/2021   GFR 84.48 10/05/2020   GFR 121.27 06/17/2019    Plan: Continue Mounjaro 5.0 mg SQ weekly Continue all other medications.  Will keep all carbohydrates low both sweets and starches.  Will continue exercise regimen to 30 to 60 minutes on most  days of the week.  Aim for 7 to 9 hours of sleep nightly.  Eat more low glycemic index foods.   Polyphagia Suzanne Conner endorses excessive hunger.  Medication(s): Mounjaro Effects of medication:  moderately controlled. Cravings are moderately controlled.   Plan: Medication(s): Mounjaro 5.0 mg SQ weekly. She has a 3 month supply.  Will increase water, protein and fiber to help assuage hunger.  Will minimize foods that have a high glucose index/load to minimize reactive hypoglycemia.    Generalized Obesity: Current BMI BMI (Calculated): 33.65   Pharmacotherapy Plan Continue  Mounjaro 5.0 mg SQ weekly  Suzanne Conner is currently in the action stage of change. As such, her goal is to continue with weight loss efforts.  She has agreed to the Category 1 plan.  Exercise goals: Older adults should determine their level of effort for physical activity relative to their level of fitness.   Behavioral modification strategies: increasing lean protein intake, meal planning , and planning for success.  Suzanne Conner has agreed to follow-up with our clinic in 4 weeks.   No orders of the defined types were placed in this encounter.   There are no discontinued medications.   No orders of the defined types were placed in this encounter.     Objective:   VITALS: Per patient if applicable, see vitals. GENERAL: Alert and in no acute distress. CARDIOPULMONARY: No increased WOB. Speaking in clear sentences.  PSYCH: Pleasant and cooperative. Speech normal rate and rhythm. Affect is appropriate. Insight  and judgement are appropriate. Attention is focused, linear, and appropriate.  NEURO: Oriented as arrived to appointment on time with no prompting.   Attestation Statements:    This was prepared with the assistance of Engineer, civil (consulting).  Occasional wrong-word or sound-a-like substitutions may have occurred due to the inherent limitations of voice recognition software.   Suzanne Capra, DO

## 2023-01-07 ENCOUNTER — Encounter: Payer: Medicare Other | Admitting: Family Medicine

## 2023-01-14 ENCOUNTER — Ambulatory Visit: Payer: Medicare Other | Admitting: Bariatrics

## 2023-01-20 ENCOUNTER — Telehealth: Payer: Self-pay | Admitting: Pulmonary Disease

## 2023-01-20 MED ORDER — NIRMATRELVIR/RITONAVIR (PAXLOVID)TABLET: 3.0000 | ORAL_TABLET | Freq: Two times a day (BID) | ORAL | 0 refills | Status: AC

## 2023-01-20 MED ORDER — NIRMATRELVIR/RITONAVIR (PAXLOVID)TABLET: 3.0000 | ORAL_TABLET | Freq: Two times a day (BID) | ORAL | 0 refills | Status: DC

## 2023-01-20 NOTE — Telephone Encounter (Signed)
Resending paxlovid

## 2023-01-20 NOTE — Telephone Encounter (Signed)
Complains of headache, low-grade fever 99 She tested positive for COVID Risk factors for hospitalization -sarcoidosis, severe persistent asthma  Sent prescription for Paxlovid. Stop taking statin for 5 days

## 2023-01-21 ENCOUNTER — Ambulatory Visit: Payer: Medicare Other | Admitting: Family Medicine

## 2023-01-23 ENCOUNTER — Encounter: Payer: Self-pay | Admitting: Bariatrics

## 2023-01-23 ENCOUNTER — Ambulatory Visit (INDEPENDENT_AMBULATORY_CARE_PROVIDER_SITE_OTHER): Payer: Medicare Other | Admitting: Bariatrics

## 2023-01-23 VITALS — BP 170/78 | HR 70 | Temp 97.4°F | Ht 62.0 in | Wt 179.0 lb

## 2023-01-23 DIAGNOSIS — Z7985 Long-term (current) use of injectable non-insulin antidiabetic drugs: Secondary | ICD-10-CM

## 2023-01-23 DIAGNOSIS — Z6832 Body mass index (BMI) 32.0-32.9, adult: Secondary | ICD-10-CM

## 2023-01-23 DIAGNOSIS — E118 Type 2 diabetes mellitus with unspecified complications: Secondary | ICD-10-CM

## 2023-01-23 DIAGNOSIS — E669 Obesity, unspecified: Secondary | ICD-10-CM

## 2023-01-23 DIAGNOSIS — F5089 Other specified eating disorder: Secondary | ICD-10-CM

## 2023-01-23 MED ORDER — TIRZEPATIDE 7.5 MG/0.5ML ~~LOC~~ SOAJ: 7.5000 mg | SUBCUTANEOUS | 0 refills | Status: DC

## 2023-01-28 ENCOUNTER — Other Ambulatory Visit (INDEPENDENT_AMBULATORY_CARE_PROVIDER_SITE_OTHER): Payer: Self-pay | Admitting: Bariatrics

## 2023-01-28 DIAGNOSIS — E118 Type 2 diabetes mellitus with unspecified complications: Secondary | ICD-10-CM

## 2023-01-28 NOTE — Telephone Encounter (Signed)
Call to patient.  If she transfers the prescription from [facility] she would be kicked out of their system.  Can we send in a different prescription to [facility].  Thank you.

## 2023-01-28 NOTE — Telephone Encounter (Signed)
Pt called 01/28/23 Her prescription for Suzanne Conner was sent to [facility] they are out of stock.The [facility] on [address] has it in stock would like for it to be sent to [facility]

## 2023-01-28 NOTE — Progress Notes (Signed)
Chief Complaint:   OBESITY Suzanne Conner is here to discuss her progress with her obesity treatment plan along with follow-up of her obesity related diagnoses. Suzanne Conner is on the Category 1 Plan and states she is following her eating plan approximately 80% of the time. Suzanne Conner states she is walking 4,000-5,000 steps.  Today's visit was #: 12 Starting weight: 197 lbs Starting date: 03/22/2022 Today's weight: 179 lbs Today's date: 01/23/2023 Total lbs lost to date: 18 Total lbs lost since last in-office visit: 5  Interim History: Suzanne Conner is down 5 pounds since her last visit, and she is doing well overall.  She has been out of of town and has celebrated.  Subjective:   1. Controlled type 2 diabetes mellitus with complication, without long-term current use of insulin [facility]) Suzanne Conner still has some food noise.  She is taking Mounjaro and her fasting blood sugars is controlled with her medication.  2. Other disorder of eating Suzanne Conner is taking Topamax, and she notes slight jitters.  Assessment/Plan:   1. Controlled type 2 diabetes mellitus with complication, without long-term current use of insulin (HCC) Suzanne Conner agreed to increase Mounjaro from 5 mg to 7.5 mg once weekly, and we will refill for 1 month.  She will focus on increasing her protein and fiber.  She is going to a support group for injectables.  - tirzepatide (MOUNJARO) 7.5 MG/0.5ML Pen; Inject 7.5 mg into the skin once a week.  Dispense: 2 mL; Refill: 0  2. Other disorder of eating Suzanne Conner agreed to discontinue Topamax at this time.  3. Generalized obesity  4. BMI 32.0-32.9,adult Suzanne Conner is currently in the action stage of change. As such, her goal is to continue with weight loss efforts. She has agreed to the Category 1 Plan.   Meal plan was discussed.  She will adhere closely to the plan.  Exercise goals: As is.   Behavioral modification strategies: increasing lean protein intake, decreasing simple carbohydrates,  increasing vegetables, increasing water intake, decreasing eating out, no skipping meals, meal planning and cooking strategies, keeping healthy foods in the home, and planning for success.  Suzanne Conner has agreed to follow-up with our clinic in 4 weeks. She was informed of the importance of frequent follow-up visits to maximize her success with intensive lifestyle modifications for her multiple health conditions.   Objective:   Blood pressure (!) 170/78, pulse 70, temperature (!) 97.4 F (36.3 C), height 5\' 2"  (1.575 m), weight 179 lb (81.2 kg), SpO2 99 %. Body mass index is 32.74 kg/m.  General: Cooperative, alert, well developed, in no acute distress. HEENT: Conjunctivae and lids unremarkable. Cardiovascular: Regular rhythm.  Lungs: Normal work of breathing. Neurologic: No focal deficits.   Lab Results  Component Value Date   CREATININE 0.69 02/20/2022   BUN 14 02/20/2022   NA 142 02/20/2022   K 4.4 02/20/2022   CL 104 02/20/2022   CO2 25 02/20/2022   Lab Results  Component Value Date   ALT 15 11/12/2022   AST 18 11/12/2022   ALKPHOS 72 11/12/2022   BILITOT 0.2 11/12/2022   Lab Results  Component Value Date   HGBA1C 5.4 09/21/2022   HGBA1C 6.0 (H) 03/22/2022   HGBA1C 5.8 (A) 11/16/2021   HGBA1C 5.9 05/11/2021   HGBA1C 5.6 03/18/2020   Lab Results  Component Value Date   INSULIN 9.6 03/22/2022   Lab Results  Component Value Date   TSH 0.889 02/20/2022   Lab Results  Component Value Date   CHOL 120  11/12/2022   HDL 54 11/12/2022   LDLCALC 50 11/12/2022   TRIG 83 11/12/2022   CHOLHDL 2.2 11/12/2022   Lab Results  Component Value Date   VD25OH 80.2 03/22/2022   Lab Results  Component Value Date   WBC 7.9 05/11/2021   HGB 12.7 11/12/2022   HCT 39.3 11/12/2022   MCV 85.1 05/11/2021   PLT 292.0 05/11/2021   No results found for: "IRON", "TIBC", "FERRITIN"  Attestation Statements:   Reviewed by clinician on day of visit: allergies, medications, problem  list, medical history, surgical history, family history, social history, and previous encounter notes.   Trude Mcburney, am acting as Energy manager for [employer], DO.  I have reviewed the above documentation for accuracy and completeness, and I agree with the above. Corinna Capra, DO

## 2023-01-29 ENCOUNTER — Other Ambulatory Visit (INDEPENDENT_AMBULATORY_CARE_PROVIDER_SITE_OTHER): Payer: Self-pay | Admitting: Physician Assistant

## 2023-01-29 DIAGNOSIS — E118 Type 2 diabetes mellitus with unspecified complications: Secondary | ICD-10-CM

## 2023-01-29 MED ORDER — TIRZEPATIDE 7.5 MG/0.5ML ~~LOC~~ SOAJ: 7.5000 mg | SUBCUTANEOUS | 0 refills | Status: DC

## 2023-01-29 NOTE — Progress Notes (Signed)
Sent orders for [facility] to [facility] as requested as [facility] unable to fill due to drug shortage.  Vastie Douty,PA-C

## 2023-01-30 ENCOUNTER — Other Ambulatory Visit (INDEPENDENT_AMBULATORY_CARE_PROVIDER_SITE_OTHER): Payer: Self-pay | Admitting: Bariatrics

## 2023-01-30 ENCOUNTER — Ambulatory Visit: Payer: Medicare Other | Admitting: Family Medicine

## 2023-01-30 DIAGNOSIS — E118 Type 2 diabetes mellitus with unspecified complications: Secondary | ICD-10-CM

## 2023-01-30 MED ORDER — TIRZEPATIDE 7.5 MG/0.5ML ~~LOC~~ SOAJ: 7.5000 mg | SUBCUTANEOUS | 0 refills | Status: DC

## 2023-01-31 ENCOUNTER — Ambulatory Visit (HOSPITAL_COMMUNITY): Admission: RE | Admit: 2023-01-31 | Payer: Medicare Other | Source: Ambulatory Visit

## 2023-02-21 ENCOUNTER — Encounter: Payer: Self-pay | Admitting: Nurse Practitioner

## 2023-02-21 ENCOUNTER — Ambulatory Visit (INDEPENDENT_AMBULATORY_CARE_PROVIDER_SITE_OTHER): Payer: Medicare Other | Admitting: Bariatrics

## 2023-02-21 VITALS — BP 138/76 | HR 74 | Temp 97.8°F | Ht 62.0 in | Wt 176.0 lb

## 2023-02-21 DIAGNOSIS — Z6832 Body mass index (BMI) 32.0-32.9, adult: Secondary | ICD-10-CM

## 2023-02-21 DIAGNOSIS — E118 Type 2 diabetes mellitus with unspecified complications: Secondary | ICD-10-CM

## 2023-02-21 DIAGNOSIS — E782 Mixed hyperlipidemia: Secondary | ICD-10-CM | POA: Diagnosis not present

## 2023-02-21 DIAGNOSIS — Z7985 Long-term (current) use of injectable non-insulin antidiabetic drugs: Secondary | ICD-10-CM

## 2023-02-21 DIAGNOSIS — E669 Obesity, unspecified: Secondary | ICD-10-CM | POA: Diagnosis not present

## 2023-02-21 MED ORDER — TIRZEPATIDE 10 MG/0.5ML ~~LOC~~ SOAJ: 10.0000 mg | SUBCUTANEOUS | 0 refills | Status: DC

## 2023-02-21 MED ORDER — ONDANSETRON HCL 4 MG PO TABS: 4.0000 mg | ORAL_TABLET | Freq: Three times a day (TID) | ORAL | 0 refills | Status: DC | PRN

## 2023-02-21 NOTE — Progress Notes (Signed)
WEIGHT SUMMARY AND BIOMETRICS  Weight Lost Since Last Visit: 3lb   Vitals Temp: 97.8 F (36.6 C) BP: 138/76 Pulse Rate: 74 SpO2: 98 %   Anthropometric Measurements Height: 5\' 2"  (1.575 m) Weight: 176 lb (79.8 kg) BMI (Calculated): 32.18 Weight at Last Visit: 179lb Weight Lost Since Last Visit: 3lb Starting Weight: 197lb Total Weight Loss (lbs): 21 lb (9.526 kg)   Body Composition  Body Fat %: 44.3 % Fat Mass (lbs): 78.2 lbs Muscle Mass (lbs): 93.4 lbs Total Body Water (lbs): 61.8 lbs Visceral Fat Rating : 14   Other Clinical Data Fasting: Yes Labs: No Today's Visit #: 13 Starting Date: 03/22/22    OBESITY Dnya is here to discuss her progress with her obesity treatment plan along with follow-up of her obesity related diagnoses.    Nutrition Plan: the Category 1 plan - 90% adherence.  Current exercise: walking  Interim History:  She is down 3 lbs.  Protein intake is as prescribed, Is not skipping meals, and Water intake is adequate.  Pharmacotherapy: Ryka is on Mounjaro 7.5 mg SQ weekly Adverse side effects: Nausea Hunger is moderately controlled.  Cravings are moderately controlled.  Assessment/Plan:   Type II Diabetes HgbA1c is at goal. Last A1c was 5.4 CBGs: Not checking      Episodes of hypoglycemia: no Medication(s): Mounjaro 7.5 mg SQ weekly  Lab Results  Component Value Date   HGBA1C 5.4 09/21/2022   HGBA1C 6.0 (H) 03/22/2022   HGBA1C 5.8 (A) 11/16/2021   Lab Results  Component Value Date   LDLCALC 50 11/12/2022   CREATININE 0.69 02/20/2022   Lab Results  Component Value Date   GFR 87.12 05/11/2021   GFR 84.48 10/05/2020   GFR 121.27 06/17/2019    Plan: Continue and increase dose Mounjaro 10 mg SQ weekly Rx: Zofran 4 mg every 8 hours as needed for nausea, # 20 with no refills.   Continue all other medications.  Will keep all carbohydrates low both sweets and starches.  Will continue exercise regimen to 30  to 60 minutes on most days of the week.  Aim for 7 to 9 hours of sleep nightly.  Eat more low glycemic index foods.     Hyperlipidemia LDL is at goal. Medication(s): Crestor  Cardiovascular risk factors: advanced age (older than 53 for men, 36 for women), diabetes mellitus, dyslipidemia, hypertension, obesity (BMI >= 30 kg/m2), and sedentary lifestyle  Lab Results  Component Value Date   CHOL 120 11/12/2022   HDL 54 11/12/2022   LDLCALC 50 11/12/2022   TRIG 83 11/12/2022   CHOLHDL 2.2 11/12/2022   Lab Results  Component Value Date   ALT 15 11/12/2022   AST 18 11/12/2022   ALKPHOS 72 11/12/2022   BILITOT 0.2 11/12/2022   The ASCVD Risk score (Arnett DK, et al., 2019) failed to calculate for the following reasons:   The valid total cholesterol range is 130 to 320 mg/dL  Plan:  Continue statin.  Will avoid all trans fats.  Will read labels Will minimize saturated fats except the following: low fat meats in moderation, diary, and limited dark chocolate.  Increase Omega 3 in foods, and consider an Omega 3 supplement.     Generalized Obesity: Current BMI BMI (Calculated): 32.18   Pharmacotherapy Plan Continue and increase dose  Mounjaro 10 mg SQ weekly  Evana is currently in the action stage of change. As such, her goal is to continue with weight loss efforts.  She has agreed to  the Category 1 plan.  Exercise goals: Older adults should do exercises that maintain or improve balance if they are at risk of falling.   Behavioral modification strategies: increasing lean protein intake and mindful eating.  Fryda has agreed to follow-up with our clinic in 4 weeks.       Objective:   VITALS: Per patient if applicable, see vitals. GENERAL: Alert and in no acute distress. CARDIOPULMONARY: No increased WOB. Speaking in clear sentences.  PSYCH: Pleasant and cooperative. Speech normal rate and rhythm. Affect is appropriate. Insight and judgement are appropriate. Attention  is focused, linear, and appropriate.  NEURO: Oriented as arrived to appointment on time with no prompting.   Attestation Statements:   This was prepared with the assistance of Engineer, civil (consulting).  Occasional wrong-word or sound-a-like substitutions may have occurred due to the inherent limitations of voice recognition software.   Corinna Capra, DO

## 2023-03-21 ENCOUNTER — Encounter: Payer: Self-pay | Admitting: Bariatrics

## 2023-03-21 ENCOUNTER — Ambulatory Visit (INDEPENDENT_AMBULATORY_CARE_PROVIDER_SITE_OTHER): Payer: Medicare Other | Admitting: Bariatrics

## 2023-03-21 VITALS — BP 137/75 | HR 77 | Temp 97.5°F | Ht 62.0 in | Wt 173.0 lb

## 2023-03-21 DIAGNOSIS — E669 Obesity, unspecified: Secondary | ICD-10-CM

## 2023-03-21 DIAGNOSIS — E118 Type 2 diabetes mellitus with unspecified complications: Secondary | ICD-10-CM | POA: Diagnosis not present

## 2023-03-21 DIAGNOSIS — Z6831 Body mass index (BMI) 31.0-31.9, adult: Secondary | ICD-10-CM | POA: Diagnosis not present

## 2023-03-21 DIAGNOSIS — Z7985 Long-term (current) use of injectable non-insulin antidiabetic drugs: Secondary | ICD-10-CM

## 2023-03-21 DIAGNOSIS — R632 Polyphagia: Secondary | ICD-10-CM

## 2023-03-21 DIAGNOSIS — F5089 Other specified eating disorder: Secondary | ICD-10-CM

## 2023-03-21 MED ORDER — ONDANSETRON HCL 4 MG PO TABS: 4.0000 mg | ORAL_TABLET | Freq: Three times a day (TID) | ORAL | 0 refills | Status: DC | PRN

## 2023-03-21 MED ORDER — TIRZEPATIDE 10 MG/0.5ML ~~LOC~~ SOAJ: 10.0000 mg | SUBCUTANEOUS | 0 refills | Status: DC

## 2023-03-21 NOTE — Progress Notes (Signed)
WEIGHT SUMMARY AND BIOMETRICS  Weight Lost Since Last Visit: 3lb  Vitals Temp: (!) 97.5 F (36.4 C) BP: 137/75 Pulse Rate: 77 SpO2: 99 %   Anthropometric Measurements Height: 5\' 2"  (1.575 m) Weight: 173 lb (78.5 kg) BMI (Calculated): 31.63 Weight at Last Visit: 176lb Weight Lost Since Last Visit: 3lb Starting Weight: 197lb Total Weight Loss (lbs): 24 lb (10.9 kg)   Body Composition  Body Fat %: 43.1 % Fat Mass (lbs): 74.6 lbs Muscle Mass (lbs): 93.6 lbs Total Body Water (lbs): 61.6 lbs Visceral Fat Rating : 13   Other Clinical Data Fasting: yes Labs: no Today's Visit #: 14 Starting Date: 03/22/22    OBESITY Natilie is here to discuss her progress with her obesity treatment plan along with follow-up of her obesity related diagnoses.     Nutrition Plan: the Category 1 plan - 90% adherence.  Current exercise: walking  Interim History:  She is down another 3 lbs since her last visit.  Eating all of the food on the plan., Protein intake is as prescribed, Is not skipping meals, and Water intake is adequate.  Pharmacotherapy: Ziggy is on Mounjaro 10 mg SQ weekly Adverse side effects: Nausea occasionally  Hunger is moderately controlled.  Cravings are moderately controlled.  Assessment/Plan:   1. Other disorder of eating Eating disorder/emotional eating Payten has had issues with stress eating, emotional eating, and boredom eating. Currently this is moderately controlled. Overall mood is stable. Denies suicidal/homicidal ideation. Medication(s): Topamax 50 mg BID  Plan:  Discussed cues and consequences, how thoughts affect eating, model of thoughts, feelings, and behaviors, and strategies for change by focusing on the cue. Discussed cognitive distortions, coping thoughts, and how to change your thoughts. Consider a referral to Dr. Dewaine Conger, PhD psychologist to help with eating behaviors.  Discussed distractions to curb eating  behaviors. Discussed activities to do with one's hands in the evening  Be sure to get adequate rest as lack of rest can trigger appetite.  Have plan in place for stressful events.  Consider other rewards besides food.       Generalized Obesity: Current BMI BMI (Calculated): 31.63   Polyphagia Taylia endorses excessive hunger.  Medication(s): Mounjaro Effects of medication:  moderately controlled. Cravings are moderately controlled.   Plan: Medication(s): Mounjaro 10 mg SQ weekly Will increase water, protein and fiber to help assuage hunger.  Will minimize foods that have a high glucose index/load to minimize reactive hypoglycemia.   Type II Diabetes HgbA1c is at goal. Last A1c was 5.4 CBGs: Not checking      Episodes of hypoglycemia: no Medication(s): Mounjaro 10 mg SQ weekly  Lab Results  Component Value Date   HGBA1C 5.4 09/21/2022   HGBA1C 6.0 (H) 03/22/2022   HGBA1C 5.8 (A) 11/16/2021   Lab Results  Component Value Date   LDLCALC 50 11/12/2022   CREATININE 0.69 02/20/2022   Lab Results  Component Value Date   GFR 87.12 05/11/2021   GFR 84.48 10/05/2020   GFR 121.27 06/17/2019    Plan: Continue and refill Mounjaro 10 mg SQ weekly Continue all other medications.  Will keep all carbohydrates low both sweets and starches.  Will continue exercise regimen to 30 to 60 minutes on most days of the week.  Aim for 7 to 9 hours of sleep nightly.  Eat more low glycemic index foods.    Pharmacotherapy Plan Continue and refill  Mounjaro 10 mg SQ weekly  Juliene is currently in the action stage of change. As  such, her goal is to continue with weight loss efforts.  She has agreed to the Category 1 plan.  Exercise goals: Older adults should do exercises that maintain or improve balance if they are at risk of falling.   Behavioral modification strategies: increasing lean protein intake, no meal skipping, meal planning , increase water intake, planning for success,  increasing vegetables, increasing fiber rich foods, and ways to avoid boredom eating.Will increase her exercise.   Caedence has agreed to follow-up with our clinic in 4 weeks.       Objective:   VITALS: Per patient if applicable, see vitals. GENERAL: Alert and in no acute distress. CARDIOPULMONARY: No increased WOB. Speaking in clear sentences.  PSYCH: Pleasant and cooperative. Speech normal rate and rhythm. Affect is appropriate. Insight and judgement are appropriate. Attention is focused, linear, and appropriate.  NEURO: Oriented as arrived to appointment on time with no prompting.   Attestation Statements:    This was prepared with the assistance of Engineer, civil (consulting).  Occasional wrong-word or sound-a-like substitutions may have occurred due to the inherent limitations of voice recognition software.   Corinna Capra, DO

## 2023-04-01 ENCOUNTER — Telehealth (INDEPENDENT_AMBULATORY_CARE_PROVIDER_SITE_OTHER): Payer: Self-pay | Admitting: Bariatrics

## 2023-04-01 DIAGNOSIS — E118 Type 2 diabetes mellitus with unspecified complications: Secondary | ICD-10-CM

## 2023-04-01 NOTE — Telephone Encounter (Signed)
7/22 [facility] that [facility] the medication Monjuaro and Patient wanted to know if Dr. Manson Passey can send prescription to a local Pharmacy. JE

## 2023-04-18 ENCOUNTER — Encounter: Payer: Self-pay | Admitting: Bariatrics

## 2023-04-18 ENCOUNTER — Ambulatory Visit (INDEPENDENT_AMBULATORY_CARE_PROVIDER_SITE_OTHER): Payer: Medicare Other | Admitting: Bariatrics

## 2023-04-18 VITALS — BP 151/81 | HR 75 | Temp 97.6°F | Ht 62.0 in | Wt 170.0 lb

## 2023-04-18 DIAGNOSIS — Z7985 Long-term (current) use of injectable non-insulin antidiabetic drugs: Secondary | ICD-10-CM

## 2023-04-18 DIAGNOSIS — R632 Polyphagia: Secondary | ICD-10-CM

## 2023-04-18 DIAGNOSIS — Z6831 Body mass index (BMI) 31.0-31.9, adult: Secondary | ICD-10-CM

## 2023-04-18 DIAGNOSIS — E118 Type 2 diabetes mellitus with unspecified complications: Secondary | ICD-10-CM | POA: Diagnosis not present

## 2023-04-18 DIAGNOSIS — E669 Obesity, unspecified: Secondary | ICD-10-CM

## 2023-04-18 MED ORDER — TIRZEPATIDE 10 MG/0.5ML ~~LOC~~ SOAJ: 10.0000 mg | SUBCUTANEOUS | 0 refills | Status: DC

## 2023-04-18 NOTE — Progress Notes (Signed)
WEIGHT SUMMARY AND BIOMETRICS  Weight Lost Since Last Visit: 3lb  Vitals Temp: 97.6 F (36.4 C) BP: (!) 151/81 Pulse Rate: 75 SpO2: 100 %   Anthropometric Measurements Height: 5\' 2"  (1.575 m) Weight: 170 lb (77.1 kg) BMI (Calculated): 31.09 Weight at Last Visit: 173lb Weight Lost Since Last Visit: 3lb Starting Weight: 197lb Total Weight Loss (lbs): 27 lb (12.2 kg)   Body Composition  Body Fat %: 42.9 % Fat Mass (lbs): 73.2 lbs Muscle Mass (lbs): 92.4 lbs Total Body Water (lbs): 61 lbs Visceral Fat Rating : 13   Other Clinical Data Fasting: no Labs: no Today's Visit #: 15 Starting Date: 03/22/22    OBESITY Suzanne Conner is here to discuss her progress with her obesity treatment plan along with follow-up of her obesity related diagnoses.     Nutrition Plan: the Category 1 plan - 95% adherence.  Current exercise: walking  Interim History:  She is down another 3 lbs.  Eating all of the food on the plan., Is not skipping meals, and Water intake is adequate.  Pharmacotherapy: Suzanne Conner is on Mounjaro 10 mg SQ weekly Adverse side effects: None Hunger is moderately controlled.  Cravings are moderately controlled.  Assessment/Plan:   Type II Diabetes HgbA1c is at goal. Last A1c was 5.4 CBGs: Not checking      Episodes of hypoglycemia: no Medication(s): Mounjaro 10 mg SQ weekly  Lab Results  Component Value Date   HGBA1C 5.4 09/21/2022   HGBA1C 6.0 (H) 03/22/2022   HGBA1C 5.8 (A) 11/16/2021   Lab Results  Component Value Date   LDLCALC 50 11/12/2022   CREATININE 0.69 02/20/2022   Lab Results  Component Value Date   GFR 87.12 05/11/2021   GFR 84.48 10/05/2020   GFR 121.27 06/17/2019    Plan: Continue and refill Mounjaro 10 mg SQ weekly Continue all other medications.  Will keep all carbohydrates low both sweets and starches.  Will continue exercise regimen to 30 to 60 minutes on most days of the week.  Aim for 7 to 9 hours of sleep  nightly.  Eat more low glycemic index foods.   Polyphagia Suzanne Conner endorses excessive hunger.  Medication(s): Mounjaro Effects of medication:  moderately controlled. Cravings are moderately controlled.   Plan: Medication(s): Mounjaro 10 mg SQ weekly Will increase water, protein and fiber to help assuage hunger.  Will minimize foods that have a high glucose index/load to minimize reactive hypoglycemia.     Generalized Obesity: Current BMI BMI (Calculated): 31.09   Pharmacotherapy Plan Continue and refill  Mounjaro 10 mg SQ weekly  Suzanne Conner is currently in the action stage of change. As such, her goal is to continue with weight loss efforts.  She has agreed to the Category 1 plan.  Exercise goals: Older adults with chronic conditions should understand whether and how their conditions affect their ability to do regular physical activity safely.  Behavioral modification strategies: increasing lean protein intake, no meal skipping, decrease eating out, meal planning , better snacking choices, planning for success, and mindful eating.  Suzanne Conner has agreed to follow-up with our clinic in 4 weeks.       Objective:   VITALS: Per patient if applicable, see vitals. GENERAL: Alert and in no acute distress. CARDIOPULMONARY: No increased WOB. Speaking in clear sentences.  PSYCH: Pleasant and cooperative. Speech normal rate and rhythm. Affect is appropriate. Insight and judgement are appropriate. Attention is focused, linear, and appropriate.  NEURO: Oriented as arrived to appointment on time with no prompting.  Attestation Statements:    This was prepared with the assistance of Engineer, civil (consulting).  Occasional wrong-word or sound-a-like substitutions may have occurred due to the inherent limitations of voice recognition software.   Corinna Capra, DO

## 2023-04-19 ENCOUNTER — Encounter: Payer: Self-pay | Admitting: Family Medicine

## 2023-04-19 ENCOUNTER — Ambulatory Visit (INDEPENDENT_AMBULATORY_CARE_PROVIDER_SITE_OTHER): Payer: Medicare Other | Admitting: Family Medicine

## 2023-04-19 VITALS — BP 130/70 | HR 85 | Temp 98.1°F | Ht 62.8 in | Wt 174.0 lb

## 2023-04-19 DIAGNOSIS — Z0000 Encounter for general adult medical examination without abnormal findings: Secondary | ICD-10-CM

## 2023-04-19 DIAGNOSIS — F419 Anxiety disorder, unspecified: Secondary | ICD-10-CM

## 2023-04-19 DIAGNOSIS — E669 Obesity, unspecified: Secondary | ICD-10-CM

## 2023-04-19 DIAGNOSIS — I10 Essential (primary) hypertension: Secondary | ICD-10-CM | POA: Diagnosis not present

## 2023-04-19 DIAGNOSIS — E118 Type 2 diabetes mellitus with unspecified complications: Secondary | ICD-10-CM | POA: Diagnosis not present

## 2023-04-19 DIAGNOSIS — I2510 Atherosclerotic heart disease of native coronary artery without angina pectoris: Secondary | ICD-10-CM | POA: Diagnosis not present

## 2023-04-19 DIAGNOSIS — Z6831 Body mass index (BMI) 31.0-31.9, adult: Secondary | ICD-10-CM

## 2023-04-19 DIAGNOSIS — Z1159 Encounter for screening for other viral diseases: Secondary | ICD-10-CM

## 2023-04-19 DIAGNOSIS — J4540 Moderate persistent asthma, uncomplicated: Secondary | ICD-10-CM

## 2023-04-19 LAB — CBC WITH DIFFERENTIAL/PLATELET
Basophils Absolute: 0 10*3/uL (ref 0.0–0.1)
Basophils Relative: 0.6 % (ref 0.0–3.0)
Eosinophils Absolute: 0 10*3/uL (ref 0.0–0.7)
Eosinophils Relative: 0.7 % (ref 0.0–5.0)
HCT: 41.6 % (ref 36.0–46.0)
Hemoglobin: 13.3 g/dL (ref 12.0–15.0)
Lymphocytes Relative: 31.6 % (ref 12.0–46.0)
Lymphs Abs: 2.1 10*3/uL (ref 0.7–4.0)
MCHC: 32 g/dL (ref 30.0–36.0)
MCV: 87 fl (ref 78.0–100.0)
Monocytes Absolute: 0.5 10*3/uL (ref 0.1–1.0)
Monocytes Relative: 7.6 % (ref 3.0–12.0)
Neutro Abs: 4 10*3/uL (ref 1.4–7.7)
Neutrophils Relative %: 59.5 % (ref 43.0–77.0)
Platelets: 311 10*3/uL (ref 150.0–400.0)
RBC: 4.78 Mil/uL (ref 3.87–5.11)
RDW: 14.9 % (ref 11.5–15.5)
WBC: 6.7 10*3/uL (ref 4.0–10.5)

## 2023-04-19 LAB — COMPREHENSIVE METABOLIC PANEL
ALT: 20 U/L (ref 0–35)
AST: 19 U/L (ref 0–37)
Albumin: 4.3 g/dL (ref 3.5–5.2)
Alkaline Phosphatase: 58 U/L (ref 39–117)
BUN: 22 mg/dL (ref 6–23)
CO2: 27 mEq/L (ref 19–32)
Calcium: 10.2 mg/dL (ref 8.4–10.5)
Chloride: 104 mEq/L (ref 96–112)
Creatinine, Ser: 0.7 mg/dL (ref 0.40–1.20)
GFR: 84.42 mL/min (ref >60.00)
Glucose, Bld: 100 mg/dL — ABNORMAL HIGH (ref 70–99)
Potassium: 4.4 mEq/L (ref 3.5–5.1)
Sodium: 140 mEq/L (ref 135–145)
Total Bilirubin: 0.4 mg/dL (ref 0.2–1.2)
Total Protein: 7.5 g/dL (ref 6.0–8.3)

## 2023-04-19 LAB — MICROALBUMIN / CREATININE URINE RATIO
Creatinine,U: 68.9 mg/dL
Microalb Creat Ratio: 1 mg/g (ref 0.0–30.0)
Microalb, Ur: <0.7 mg/dL (ref 0.0–1.9)

## 2023-04-19 LAB — LIPID PANEL
Cholesterol: 132 mg/dL (ref 0–200)
HDL: 45.5 mg/dL (ref >39.00)
LDL Cholesterol: 69 mg/dL (ref 0–99)
NonHDL: 86.14
Total CHOL/HDL Ratio: 3
Triglycerides: 85 mg/dL (ref 0.0–149.0)
VLDL: 17 mg/dL (ref 0.0–40.0)

## 2023-04-19 LAB — HEMOGLOBIN A1C: Hgb A1c MFr Bld: 5.5 % (ref 4.6–6.5)

## 2023-04-19 LAB — T4, FREE: Free T4: 1.83 ng/dL — ABNORMAL HIGH (ref 0.60–1.60)

## 2023-04-19 LAB — TSH: TSH: 0.68 u[IU]/mL (ref 0.35–5.50)

## 2023-04-19 NOTE — Progress Notes (Signed)
Annual Wellness Visit     Patient: Suzanne Conner, Female    DOB: [birth year], 77 y.o.   MRN: [id number]  Subjective  Chief Complaint  Patient presents with   Annual Exam    6 mth follow-up     Suzanne Conner is a 77 y.o. female who presents today for her Annual Wellness Visit. She reports consuming a  balanced  diet as followed by wt management.  Pt exercising regularly  She generally feels fairly well. She reports sleeping fairly well. She does have additional problems to discuss today.  Requesting CPE and f/u on chronic conditions.   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  HPI DMII well controlled.  BS typically around 89, 91, 101 at home.  On jardiance 10 mg, mounjaro 10 mg wkly.  Followed by [facility]. Followed by wt management.  On mounjaro 10 mg wkly. BP controlled on micardis 40 mg States breathing has been good.  Vision: Has appointment scheduled next week and Dental: No current dental problems and Receives regular dental care   Past Medical History:  Diagnosis Date   Arthritis    "left hip; right wrist" (06/12/2018)   Asthma    Depression    Family history of polyps in the colon    GERD (gastroesophageal reflux disease)    Glaucoma    Hypertension    Paroxysmal SVT (supraventricular tachycardia) 03/23/2022   2 Supraventricular Tachycardia runs occurred, the run with the fastest interval lasting 19 beats with a max rate of 218 bpm    Type 2 diabetes, diet controlled (HCC)    Past Surgical History:  Procedure Laterality Date   BUNIONECTOMY Right    CATARACT EXTRACTION W/ INTRAOCULAR LENS IMPLANT Right    JOINT REPLACEMENT     REPLACEMENT TOTAL KNEE Right 2015   REVERSE SHOULDER ARTHROPLASTY Left 06/12/2018   REVERSE SHOULDER ARTHROPLASTY Left 06/12/2018   Procedure: LEFT REVERSE SHOULDER ARTHROPLASTY;  Surgeon: Francena Hanly, MD;  Location: [facility] OR;  Service: Orthopedics;  Laterality: Left;   SHOULDER  ARTHROSCOPY W/ ROTATOR CUFF REPAIR Right 2015   TUBAL LIGATION     Social History   Tobacco Use   Smoking status: Never   Smokeless tobacco: Never  Vaping Use   Vaping status: Never Used  Substance Use Topics   Alcohol use: Yes    Alcohol/week: 5.0 standard drinks of alcohol    Types: 5 Glasses of wine per week   Drug use: Never    Comment: 06/12/2018 CBD oil for pain   Family History  Problem Relation Age of Onset   Asthma Brother    Diabetes Brother    Dementia Mother    Cancer Father    Heart disease Sister    Diabetes Brother    Diabetes Brother    Healthy Son    Healthy Daughter    Allergies  Allergen Reactions   Paxil [Paroxetine]     Paxil 20 mg caused a tremor.   Penicillins Hives and Other (See Comments)    PATIENT HAS HAD A PCN REACTION WITH IMMEDIATE RASH, FACIAL/TONGUE/THROAT SWELLING, SOB, OR LIGHTHEADEDNESS WITH HYPOTENSION:  #  #  YES  #  #  INCLUDING HIVES AS A CHILD Has patient had a PCN reaction causing severe rash involving mucus membranes or skin necrosis: No Has patient had a PCN reaction that required hospitalization: No Has patient had a PCN reaction occurring within the last 10  years: No If all of the above answers are "NO", then may proceed with Cephalosporin use. PATIENT HAS HAD A PCN REACTION WITH IMMEDIATE RASH, FACIAL/TONGUE/THROAT SWELLING, SOB, OR LIGHTHEADEDNESS WITH HYPOTENSION:  #  #  YES  #  #  INCLUDING HIVES AS A CHILD Has patient had a PCN reaction causing severe rash involving mucus membranes or skin necrosis: No Has patient had a PCN reaction that required hospitalization: No Has patient had a PCN reaction occurring within the last 10 years: No If all of the above answers are "NO", then may proceed with Cephalosporin use.    Atorvastatin     Joint pain on 40mg    Simvastatin     Joint pain on 20mg  daily      Medications: Outpatient Medications Prior to Visit  Medication Sig   aspirin EC 81 MG tablet Take 81 mg by mouth  daily.   Cholecalciferol (VITAMIN D) 2000 units CAPS Take 2,000 Units by mouth daily.   clindamycin (CLEOCIN) 300 MG capsule Take 300 mg by mouth 2 (two) times daily. Prior to dental appointments   Clobetasol Propionate 0.05 % shampoo Use a small amount on the affected areas twice a day as needed.   empagliflozin (JARDIANCE) 10 MG TABS tablet Take 1 tablet (10 mg total) by mouth daily before breakfast.   fexofenadine (ALLEGRA) 180 MG tablet Take 1 tablet (180 mg total) by mouth daily.   fluticasone-salmeterol (WIXELA INHUB) 500-50 MCG/ACT AEPB USE 1 INHALATION IN THE MORNING AND AT BEDTIME   hydrocortisone valerate cream (WESTCORT) 0.2 % Apply 1 application topically 2 (two) times daily.   latanoprost (XALATAN) 0.005 % ophthalmic solution Place 1 drop into both eyes at bedtime.   Melatonin 10 MG TABS Take 1 tablet by mouth at bedtime as needed.   Multiple Vitamins-Minerals (CENTRUM SILVER PO) Take 1 tablet by mouth daily.    nebivolol (BYSTOLIC) 5 MG tablet Take 1 tablet (5 mg total) by mouth at bedtime.   omeprazole (PRILOSEC) 40 MG capsule TAKE 1 CAPSULE TWICE A DAY   ondansetron (ZOFRAN) 4 MG tablet Take 1 tablet (4 mg total) by mouth every 8 (eight) hours as needed for nausea or vomiting.   OPTIVE 0.5-0.9 % ophthalmic solution Place 1 drop into both eyes 2 (two) times daily.   Probiotic Product (TRUNATURE DIGESTIVE PROBIOTIC) CAPS Take 1 capsule by mouth daily.    RESTASIS 0.05 % ophthalmic emulsion Place 1 drop into both eyes in the morning and at bedtime.   rosuvastatin (CRESTOR) 5 MG tablet TAKE 1 TABLET DAILY   telmisartan (MICARDIS) 40 MG tablet TAKE 1 TABLET DAILY   tirzepatide (MOUNJARO) 10 MG/0.5ML Pen Inject 10 mg into the skin once a week.   albuterol (VENTOLIN HFA) 108 (90 Base) MCG/ACT inhaler Inhale 2 puffs into the lungs every 6 (six) hours as needed for wheezing or shortness of breath. (Patient not taking: Reported on 04/19/2023)   topiramate (TOPAMAX) 50 MG tablet Take 1 tablet  (50 mg total) by mouth 2 (two) times daily. With supper daily (Patient not taking: Reported on 04/19/2023)   No facility-administered medications prior to visit.    Allergies  Allergen Reactions   Paxil [Paroxetine]     Paxil 20 mg caused a tremor.   Penicillins Hives and Other (See Comments)    PATIENT HAS HAD A PCN REACTION WITH IMMEDIATE RASH, FACIAL/TONGUE/THROAT SWELLING, SOB, OR LIGHTHEADEDNESS WITH HYPOTENSION:  #  #  YES  #  #  INCLUDING HIVES AS A CHILD Has  patient had a PCN reaction causing severe rash involving mucus membranes or skin necrosis: No Has patient had a PCN reaction that required hospitalization: No Has patient had a PCN reaction occurring within the last 10 years: No If all of the above answers are "NO", then may proceed with Cephalosporin use. PATIENT HAS HAD A PCN REACTION WITH IMMEDIATE RASH, FACIAL/TONGUE/THROAT SWELLING, SOB, OR LIGHTHEADEDNESS WITH HYPOTENSION:  #  #  YES  #  #  INCLUDING HIVES AS A CHILD Has patient had a PCN reaction causing severe rash involving mucus membranes or skin necrosis: No Has patient had a PCN reaction that required hospitalization: No Has patient had a PCN reaction occurring within the last 10 years: No If all of the above answers are "NO", then may proceed with Cephalosporin use.    Atorvastatin     Joint pain on 40mg    Simvastatin     Joint pain on 20mg  daily    Patient Care Team: Deeann Saint, MD as PCP - General (Family Medicine) Orbie Pyo, MD as PCP - Cardiology (Cardiology) Durene Romans, MD as Consulting Physician (Orthopedic Surgery) Boisvert, Humberto Leep, MD as Referring Physician (Ophthalmology) Dimitri Ped, MD as Consulting Physician (Surgery)  ROS      Objective  BP 130/70 (BP Location: Left Arm, Patient Position: Sitting, Cuff Size: Normal)   Pulse 85   Temp 98.1 F (36.7 C) (Oral)   Ht 5' 2.8" (1.595 m)   Wt 174 lb (78.9 kg)   SpO2 99%   BMI 31.02 kg/m  BP Readings from Last  3 Encounters:  04/19/23 130/70  04/18/23 (!) 151/81  03/21/23 137/75   Wt Readings from Last 3 Encounters:  04/19/23 174 lb (78.9 kg)  04/18/23 170 lb (77.1 kg)  03/21/23 173 lb (78.5 kg)      Physical Exam Constitutional:      Appearance: Normal appearance.  HENT:     Head: Normocephalic and atraumatic.     Right Ear: Tympanic membrane, ear canal and external ear normal.     Left Ear: Tympanic membrane, ear canal and external ear normal.     Nose: Nose normal.     Mouth/Throat:     Mouth: Mucous membranes are moist.     Pharynx: No oropharyngeal exudate or posterior oropharyngeal erythema.  Eyes:     General: No scleral icterus.    Extraocular Movements: Extraocular movements intact.     Conjunctiva/sclera: Conjunctivae normal.     Pupils: Pupils are equal, round, and reactive to light.  Neck:     Thyroid: No thyromegaly.  Cardiovascular:     Rate and Rhythm: Normal rate and regular rhythm.     Pulses: Normal pulses.     Heart sounds: Normal heart sounds. No murmur heard.    No friction rub.  Pulmonary:     Effort: Pulmonary effort is normal.     Breath sounds: Normal breath sounds. No wheezing, rhonchi or rales.  Abdominal:     General: Bowel sounds are normal.     Palpations: Abdomen is soft.     Tenderness: There is no abdominal tenderness.  Musculoskeletal:        General: No deformity. Normal range of motion.  Lymphadenopathy:     Cervical: No cervical adenopathy.  Skin:    General: Skin is warm and dry.     Findings: No lesion.  Neurological:     General: No focal deficit present.     Mental Status: She is  alert and oriented to person, place, and time.  Psychiatric:        Mood and Affect: Mood normal.        Thought Content: Thought content normal.    Most recent functional status assessment:     No data to display         Most recent fall risk assessment:    04/19/2023    1:13 PM  Fall Risk   Falls in the past year? 0  Number falls in past  yr: 0  Injury with Fall? 0  Risk for fall due to : No Fall Risks  Follow up Falls evaluation completed    Most recent depression screenings:    04/19/2023    1:13 PM 09/21/2022    8:42 AM  PHQ 2/9 Scores  PHQ - 2 Score 0 0  PHQ- 9 Score 1 2   Most recent cognitive screening: A&Ox3.  Most recent Audit-C alcohol use screening     No data to display         A score of 3 or more in women, and 4 or more in men indicates increased risk for alcohol abuse, EXCEPT if all of the points are from question 1     Assessment & Plan   Annual wellness visit done today including the all of the following: Reviewed patient's Family Medical History Reviewed and updated list of patient's medical providers Assessment of cognitive impairment was done Assessed patient's functional ability Established a written schedule for health screening services Health Risk Assessent Completed and Reviewed  Exercise Activities and Dietary recommendations  Goals   None     Immunization History  Administered Date(s) Administered   Fluad Quad(high Dose 65+) 06/22/2020, 07/06/2022   Influenza, High Dose Seasonal PF 05/12/2019, 06/10/2021   Influenza-Unspecified 05/29/2018, 05/12/2019   PFIZER(Purple Top)SARS-COV-2 Vaccination 10/01/2019, 10/22/2019, 06/17/2020, 12/08/2020   Pfizer Covid-19 Vaccine Bivalent Booster 82yrs & up 07/06/2022   Pneumococcal Polysaccharide-23 06/13/2018   Pneumococcal-Unspecified 07/06/2022   RSV,unspecified 07/06/2022   Zoster Recombinant(Shingrix) 03/10/2019, 05/12/2019   Zoster, Live 04/27/2008    Health Maintenance  Topic Date Due   DTaP/Tdap/Td (1 - Tdap) Never done   DEXA SCAN  Never done   Medicare Annual Wellness (AWV)  05/11/2022   COVID-19 Vaccine (6 - 2023-24 season) 08/31/2022   INFLUENZA VACCINE  04/11/2023   Pneumonia Vaccine 11+ Years old (2 of 2 - PCV) 07/07/2023   OPHTHALMOLOGY EXAM  05/10/2023 (Originally 02/13/2022)   FOOT EXAM  05/24/2023   HEMOGLOBIN  A1C  10/20/2023   Diabetic kidney evaluation - eGFR measurement  04/18/2024   Diabetic kidney evaluation - Urine ACR  04/18/2024   Colonoscopy  11/08/2027   Hepatitis C Screening  Completed   Zoster Vaccines- Shingrix  Completed   HPV VACCINES  Aged Out     Discussed health benefits of physical activity, and encouraged her to engage in regular exercise appropriate for her age and condition.    Problem List Items Addressed This Visit       Cardiovascular and Mediastinum   Coronary artery disease involving native coronary artery of native heart without angina pectoris (Chronic)   Relevant Orders   CBC with Differential/Platelet (Completed)   Lipid panel (Completed)   CMP (Completed)   Essential hypertension - Primary   Relevant Orders   CBC with Differential/Platelet (Completed)   CMP (Completed)     Respiratory   Moderate persistent asthma without complication     Endocrine   Controlled type  2 diabetes mellitus with complication, without long-term current use of insulin (HCC)   Relevant Orders   Hemoglobin A1c (Completed)   Microalbumin/Creatinine Ratio, Urine (Completed)     Other   Anxiety   Relevant Orders   TSH (Completed)   T4, Free (Completed)   Other Visit Diagnoses     Class 1 obesity with serious comorbidity and body mass index (BMI) of 31.0 to 31.9 in adult, unspecified obesity type       Relevant Orders   TSH (Completed)   T4, Free (Completed)   Hemoglobin A1c (Completed)   Lipid panel (Completed)   CMP (Completed)   Encounter for hepatitis C screening test for low risk patient       Relevant Orders   Hep C Antibody (Completed)   Medicare annual wellness visit, subsequent          Pt stable.  Improvement in BP and blood sugar noted.  Mood improving.  Continue working with [employer].  Continue lifestyle modifications.  Continue current medications.  Return in about 6 months (around 10/20/2023).  Sooner if needed    Deeann Saint,  MD

## 2023-04-26 ENCOUNTER — Encounter: Payer: Self-pay | Admitting: Family Medicine

## 2023-04-29 DIAGNOSIS — L218 Other seborrheic dermatitis: Secondary | ICD-10-CM | POA: Diagnosis not present

## 2023-05-28 DIAGNOSIS — H25812 Combined forms of age-related cataract, left eye: Secondary | ICD-10-CM | POA: Diagnosis not present

## 2023-05-28 DIAGNOSIS — H04123 Dry eye syndrome of bilateral lacrimal glands: Secondary | ICD-10-CM | POA: Diagnosis not present

## 2023-05-28 DIAGNOSIS — H401132 Primary open-angle glaucoma, bilateral, moderate stage: Secondary | ICD-10-CM | POA: Diagnosis not present

## 2023-05-28 DIAGNOSIS — H18513 Endothelial corneal dystrophy, bilateral: Secondary | ICD-10-CM | POA: Diagnosis not present

## 2023-05-30 ENCOUNTER — Encounter: Payer: Self-pay | Admitting: Bariatrics

## 2023-05-30 ENCOUNTER — Ambulatory Visit (INDEPENDENT_AMBULATORY_CARE_PROVIDER_SITE_OTHER): Payer: Medicare Other | Admitting: Bariatrics

## 2023-05-30 VITALS — BP 128/78 | HR 77 | Temp 97.4°F | Ht 62.0 in | Wt 166.0 lb

## 2023-05-30 DIAGNOSIS — Z7984 Long term (current) use of oral hypoglycemic drugs: Secondary | ICD-10-CM | POA: Diagnosis not present

## 2023-05-30 DIAGNOSIS — E669 Obesity, unspecified: Secondary | ICD-10-CM | POA: Diagnosis not present

## 2023-05-30 DIAGNOSIS — Z7985 Long-term (current) use of injectable non-insulin antidiabetic drugs: Secondary | ICD-10-CM

## 2023-05-30 DIAGNOSIS — Z6830 Body mass index (BMI) 30.0-30.9, adult: Secondary | ICD-10-CM | POA: Diagnosis not present

## 2023-05-30 DIAGNOSIS — I10 Essential (primary) hypertension: Secondary | ICD-10-CM | POA: Diagnosis not present

## 2023-05-30 DIAGNOSIS — E118 Type 2 diabetes mellitus with unspecified complications: Secondary | ICD-10-CM

## 2023-05-30 MED ORDER — ONDANSETRON HCL 4 MG PO TABS: 4.0000 mg | ORAL_TABLET | Freq: Three times a day (TID) | ORAL | 0 refills | Status: DC | PRN

## 2023-05-30 NOTE — Progress Notes (Signed)
WEIGHT SUMMARY AND BIOMETRICS  Weight Lost Since Last Visit: 4lb  Vitals Temp: (!) 97.4 F (36.3 C) BP: 128/78 Pulse Rate: 77 SpO2: 99 %   Anthropometric Measurements Height: 5\' 2"  (1.575 m) Weight: 166 lb (75.3 kg) BMI (Calculated): 30.35 Weight at Last Visit: 170lb Weight Lost Since Last Visit: 4lb Starting Weight: 197lb Total Weight Loss (lbs): 31 lb (14.1 kg)   Body Composition  Body Fat %: 41.8 % Fat Mass (lbs): 69.6 lbs Muscle Mass (lbs): 92 lbs Total Body Water (lbs): 61 lbs Visceral Fat Rating : 12   Other Clinical Data Fasting: yes Labs: no Today's Visit #: 16 Starting Date: 03/22/22    OBESITY Suzanne Conner is here to discuss her progress with her obesity treatment plan along with follow-up of her obesity related diagnoses.     Nutrition Plan: the Category 1 plan - 95% adherence.  Current exercise:  She is going to the [facility] for exercise.  Interim History:  She is down 4 lbs since her last visit. She is noticing that her clothes are getting too large.  Eating all of the food on the plan., Is not skipping meals, and Water intake is adequate.  Pharmacotherapy: Suzanne Conner is on Mounjaro 10 mg SQ weekly Adverse side effects: occasional nausea Hunger is moderately controlled.  Cravings are moderately controlled.  Assessment/Plan:   Type II Diabetes HgbA1c is at goal. Last A1c was 5.5 CBGs: Not checking      Episodes of hypoglycemia: no Medication(s): Zepbound 10 mg SQ weekly, Jardiance 10 mg  Lab Results  Component Value Date   HGBA1C 5.5 04/19/2023   HGBA1C 5.4 09/21/2022   HGBA1C 6.0 (H) 03/22/2022   Lab Results  Component Value Date   MICROALBUR <0.7 04/19/2023   LDLCALC 69 04/19/2023   CREATININE 0.70 04/19/2023   Lab Results  Component Value Date   GFR 84.42 04/19/2023   GFR 87.12 05/11/2021   GFR 84.48 10/05/2020    Plan: Continue Mounjaro 10 mg SQ weekly Continue all other medications.  Will keep all carbohydrates  low both sweets and starches.  Will continue in her weight loss group.  Aim for 7 to 9 hours of sleep nightly.  Eat more low glycemic index foods.   Hypertension Hypertension stable.  Medication(s): Bystolic 5 mg, Micardis 40 mg  BP Readings from Last 3 Encounters:  05/30/23 128/78  04/19/23 130/70  04/18/23 (!) 151/81   Lab Results  Component Value Date   CREATININE 0.70 04/19/2023   CREATININE 0.69 02/20/2022   CREATININE 0.77 10/31/2021   Lab Results  Component Value Date   GFR 84.42 04/19/2023   GFR 87.12 05/11/2021   GFR 84.48 10/05/2020    Plan: Continue all antihypertensives at current dosages. No added salt. Will keep sodium content to 1,500 mg or less per day.     Generalized Obesity: Current BMI BMI (Calculated): 30.35   Pharmacotherapy Plan Continue  Mounjaro 10 mg SQ weekly  Suzanne Conner is currently in the action stage of change. As such, her goal is to continue with weight loss efforts.  She has agreed to the Category 1 plan.  Exercise goals: Older adults should determine their level of effort for physical activity relative to their level of fitness.  She will increase her walking to more days per week and will walk 1 mile at a given time.   Behavioral modification strategies: increasing lean protein intake, decreasing simple carbohydrates , no meal skipping, meal planning , increase water intake, planning for success,  and mindful eating.  Suzanne Conner has agreed to follow-up with our clinic in 4 weeks.       Objective:   VITALS: Per patient if applicable, see vitals. GENERAL: Alert and in no acute distress. CARDIOPULMONARY: No increased WOB. Speaking in clear sentences.  PSYCH: Pleasant and cooperative. Speech normal rate and rhythm. Affect is appropriate. Insight and judgement are appropriate. Attention is focused, linear, and appropriate.  NEURO: Oriented as arrived to appointment on time with no prompting.   Attestation Statements:    This was  prepared with the assistance of Engineer, civil (consulting).  Occasional wrong-word or sound-a-like substitutions may have occurred due to the inherent limitations of voice recognition software. Corinna Capra, DO

## 2023-05-30 NOTE — Telephone Encounter (Signed)
error 

## 2023-06-19 DIAGNOSIS — Z96612 Presence of left artificial shoulder joint: Secondary | ICD-10-CM | POA: Diagnosis not present

## 2023-06-19 DIAGNOSIS — M542 Cervicalgia: Secondary | ICD-10-CM | POA: Diagnosis not present

## 2023-06-19 DIAGNOSIS — M47812 Spondylosis without myelopathy or radiculopathy, cervical region: Secondary | ICD-10-CM | POA: Diagnosis not present

## 2023-07-02 ENCOUNTER — Ambulatory Visit (INDEPENDENT_AMBULATORY_CARE_PROVIDER_SITE_OTHER): Payer: Medicare Other | Admitting: Bariatrics

## 2023-07-02 ENCOUNTER — Encounter: Payer: Self-pay | Admitting: Bariatrics

## 2023-07-02 VITALS — BP 126/71 | HR 79 | Temp 97.4°F | Ht 62.0 in | Wt 165.0 lb

## 2023-07-02 DIAGNOSIS — E785 Hyperlipidemia, unspecified: Secondary | ICD-10-CM | POA: Diagnosis not present

## 2023-07-02 DIAGNOSIS — E66811 Obesity, class 1: Secondary | ICD-10-CM | POA: Diagnosis not present

## 2023-07-02 DIAGNOSIS — Z7985 Long-term (current) use of injectable non-insulin antidiabetic drugs: Secondary | ICD-10-CM

## 2023-07-02 DIAGNOSIS — E118 Type 2 diabetes mellitus with unspecified complications: Secondary | ICD-10-CM

## 2023-07-02 DIAGNOSIS — E669 Obesity, unspecified: Secondary | ICD-10-CM | POA: Diagnosis not present

## 2023-07-02 DIAGNOSIS — Z6830 Body mass index (BMI) 30.0-30.9, adult: Secondary | ICD-10-CM

## 2023-07-02 DIAGNOSIS — E782 Mixed hyperlipidemia: Secondary | ICD-10-CM

## 2023-07-02 MED ORDER — TIRZEPATIDE 12.5 MG/0.5ML ~~LOC~~ SOAJ: 12.5000 mg | SUBCUTANEOUS | 0 refills | Status: DC

## 2023-07-02 NOTE — Progress Notes (Signed)
WEIGHT SUMMARY AND BIOMETRICS  Weight Lost Since Last Visit: 1lb  Weight Gained Since Last Visit: 0   Vitals Temp: (!) 97.4 F (36.3 C) BP: 126/71 Pulse Rate: 79 SpO2: 99 %   Anthropometric Measurements Height: 5\' 2"  (1.575 m) Weight: 165 lb (74.8 kg) BMI (Calculated): 30.17 Weight at Last Visit: 166lb Weight Lost Since Last Visit: 1lb Weight Gained Since Last Visit: 0 Starting Weight: 197lb Total Weight Loss (lbs): 32 lb (14.5 kg)   Body Composition  Body Fat %: 41.6 % Fat Mass (lbs): 68.8 lbs Muscle Mass (lbs): 91.4 lbs Total Body Water (lbs): 60.8 lbs Visceral Fat Rating : 12   Other Clinical Data Fasting: yes Labs: no Today's Visit #: 17 Starting Date: 03/22/22    OBESITY Suzanne Conner is here to discuss her progress with her obesity treatment plan along with follow-up of her obesity related diagnoses.     Nutrition Plan: the Category 1 plan - 80% adherence.  Current exercise: walking and [facility]  Interim History:  She is down another 1 lb since her last visit. She is getting hungry  at the end of the week before her medication.  Eating all of the food on the plan., Protein intake is as prescribed, Is not skipping meals, and Water intake is inadequate.  Pharmacotherapy: Suzanne Conner is on Mounjaro 10 mg SQ weekly Adverse side effects: None Hunger is moderately controlled.  Cravings are moderately controlled.   Assessment/Plan:   Type II Diabetes HgbA1c is at goal. Last A1c was 5.5 Episodes of hypoglycemia: no Medication(s): Mounjaro 10 mg SQ weekly  Lab Results  Component Value Date   HGBA1C 5.5 04/19/2023   HGBA1C 5.4 09/21/2022   HGBA1C 6.0 (H) 03/22/2022   Lab Results  Component Value Date   MICROALBUR <0.7 04/19/2023   LDLCALC 69 04/19/2023   CREATININE 0.70 04/19/2023   Lab Results  Component Value Date   GFR 84.42 04/19/2023   GFR 87.12 05/11/2021   GFR 84.48 10/05/2020    Plan: Continue and increase dose Mounjaro 12.5  mg SQ weekly Continue all other medications.  Will keep all carbohydrates low both sweets and starches.  Will continue exercise regimen to 30 to 60 minutes on most days of the week.  Aim for 7 to 9 hours of sleep nightly.  Eat more low glycemic index foods.   Hyperlipidemia LDL is at goal. Medication(s): Crestor Cardiovascular risk factors: advanced age (older than 81 for men, 85 for women), diabetes mellitus, dyslipidemia, obesity (BMI >= 30 kg/m2), and sedentary lifestyle  Lab Results  Component Value Date   CHOL 132 04/19/2023   HDL 45.50 04/19/2023   LDLCALC 69 04/19/2023   TRIG 85.0 04/19/2023   CHOLHDL 3 04/19/2023   Lab Results  Component Value Date   ALT 20 04/19/2023   AST 19 04/19/2023   ALKPHOS 58 04/19/2023   BILITOT 0.4 04/19/2023   The 10-year ASCVD risk score (Arnett DK, et al., 2019) is: 21.6%   Values used to calculate the score:     Age: 77 years     Sex: Female     Is Non-Hispanic African American: Yes     Diabetic: Yes     Tobacco smoker: No     Systolic Blood Pressure: 126 mmHg     Is BP treated: Yes     HDL Cholesterol: 45.5 mg/dL     Total Cholesterol: 132 mg/dL  Plan:  Continue statin.  Will continue to be as active as possible  Will  keep her water intake high.  Will avoid all trans fats.  Will read labels Will minimize saturated fats except the following: low fat meats in moderation, diary, and limited dark chocolate.       Generalized Obesity: Current BMI BMI (Calculated): 30.17   Pharmacotherapy Plan Continue and increase dose  Mounjaro 12.5 mg SQ weekly  Suzanne Conner is currently in the action stage of change. As such, her goal is to continue with weight loss efforts.  She has agreed to the Category 1 plan.  Exercise goals: Older adults should do exercises that maintain or improve balance if they are at risk of falling.   Behavioral modification strategies: increasing lean protein intake, meal planning , better snacking choices,  planning for success, get rid of junk food in the home, keep healthy foods in the home, and mindful eating.  Suzanne Conner has agreed to follow-up with our clinic in 4 weeks.    Objective:   VITALS: Per patient if applicable, see vitals. GENERAL: Alert and in no acute distress. CARDIOPULMONARY: No increased WOB. Speaking in clear sentences.  PSYCH: Pleasant and cooperative. Speech normal rate and rhythm. Affect is appropriate. Insight and judgement are appropriate. Attention is focused, linear, and appropriate.  NEURO: Oriented as arrived to appointment on time with no prompting.   Attestation Statements:    This was prepared with the assistance of Engineer, civil (consulting).  Occasional wrong-word or sound-a-like substitutions may have occurred due to the inherent limitations of voice recognition software.   Corinna Capra, DO

## 2023-07-15 ENCOUNTER — Other Ambulatory Visit: Payer: Self-pay | Admitting: Bariatrics

## 2023-07-29 ENCOUNTER — Ambulatory Visit (INDEPENDENT_AMBULATORY_CARE_PROVIDER_SITE_OTHER): Payer: Medicare Other | Admitting: Bariatrics

## 2023-07-29 ENCOUNTER — Encounter: Payer: Self-pay | Admitting: Bariatrics

## 2023-07-29 VITALS — BP 138/77 | HR 77 | Temp 97.9°F | Ht 62.0 in | Wt 166.0 lb

## 2023-07-29 DIAGNOSIS — E669 Obesity, unspecified: Secondary | ICD-10-CM | POA: Diagnosis not present

## 2023-07-29 DIAGNOSIS — Z6830 Body mass index (BMI) 30.0-30.9, adult: Secondary | ICD-10-CM

## 2023-07-29 DIAGNOSIS — Z7985 Long-term (current) use of injectable non-insulin antidiabetic drugs: Secondary | ICD-10-CM

## 2023-07-29 DIAGNOSIS — E6609 Other obesity due to excess calories: Secondary | ICD-10-CM

## 2023-07-29 DIAGNOSIS — I10 Essential (primary) hypertension: Secondary | ICD-10-CM | POA: Diagnosis not present

## 2023-07-29 DIAGNOSIS — E118 Type 2 diabetes mellitus with unspecified complications: Secondary | ICD-10-CM

## 2023-07-29 MED ORDER — TIRZEPATIDE 12.5 MG/0.5ML ~~LOC~~ SOAJ: 12.5000 mg | SUBCUTANEOUS | 0 refills | Status: DC

## 2023-07-29 MED ORDER — ONDANSETRON HCL 4 MG PO TABS: 4.0000 mg | ORAL_TABLET | Freq: Three times a day (TID) | ORAL | 0 refills | Status: DC | PRN

## 2023-07-29 NOTE — Progress Notes (Signed)
WEIGHT SUMMARY AND BIOMETRICS  Weight Lost Since Last Visit: 0  Weight Gained Since Last Visit: 1lb   Vitals Temp: 97.9 F (36.6 C) BP: (!) 155/77 Pulse Rate: 77 SpO2: 99 %   Anthropometric Measurements Height: 5\' 2"  (1.575 m) Weight: 166 lb (75.3 kg) BMI (Calculated): 30.35 Weight at Last Visit: 165lb Weight Lost Since Last Visit: 0 Weight Gained Since Last Visit: 1lb Starting Weight: 197lb Total Weight Loss (lbs): 31 lb (14.1 kg)   Body Composition  Body Fat %: 43.7 % Fat Mass (lbs): 72.8 lbs Muscle Mass (lbs): 89.2 lbs Total Body Water (lbs): 64.4 lbs Visceral Fat Rating : 13   Other Clinical Data Fasting: no Labs: no Today's Visit #: 18 Starting Date: 03/22/22    OBESITY Suzanne Conner is here to discuss her progress with her obesity treatment plan along with follow-up of her obesity related diagnoses.    Nutrition Plan: the Category 1 plan - 90% adherence.  Current exercise: walking  Interim History:  She is up 1 lb since her last visit.  She had a birthday in the interim.  Eating all of the food on the plan., Is not skipping meals, Meeting protein goals., Water intake is adequate., and Denies excessive cravings.   Pharmacotherapy: Suzanne Conner is on Mounjaro 12.5 mg SQ weekly Adverse side effects: Nausea Hunger is poorly controlled. She has out of the [facility] for  several weeks. Cravings are moderately controlled.  Assessment/Plan:   Type II Diabetes HgbA1c is at goal. Last A1c was 5.4 CBGs: Fasting 80's and 90's       Episodes of hypoglycemia: no Medication(s): Mounjaro 12.5 mg SQ weekly  Lab Results  Component Value Date   HGBA1C 5.5 04/19/2023   HGBA1C 5.4 09/21/2022   HGBA1C 6.0 (H) 03/22/2022   Lab Results  Component Value Date   MICROALBUR <0.7 04/19/2023   LDLCALC 69 04/19/2023   CREATININE 0.70 04/19/2023   Lab Results   Component Value Date   GFR 84.42 04/19/2023   GFR 87.12 05/11/2021   GFR 84.48 10/05/2020    Plan: Continue and refill Mounjaro 12.5 mg SQ weekly Continue all other medications.  Will keep all carbohydrates low both sweets and starches.  Will continue exercise regimen to 30 to 60 minutes on most days of the week.  Aim for 7 to 9 hours of sleep nightly.  Eat more low glycemic index foods.   Hypertension Hypertension control uncertain.  Medication(s): Bystolic 5 mg and Micardis 40 mg Repeat blood pressure today was 138/78.   BP Readings from Last 3 Encounters:  07/29/23 (!) 155/77  07/02/23 126/71  05/30/23 128/78   Lab Results  Component Value Date   CREATININE 0.70 04/19/2023   CREATININE 0.69 02/20/2022   CREATININE 0.77 10/31/2021   Lab Results  Component Value Date   GFR 84.42 04/19/2023   GFR 87.12 05/11/2021  GFR 84.48 10/05/2020    Plan: Continue all antihypertensives at current dosages. No added salt. Will keep sodium content to 1,500 mg or less per day.     Generalized Obesity: Current BMI BMI (Calculated): 30.35   Pharmacotherapy Plan Continue and refill  Mounjaro 12.5 mg SQ weekly  Suzanne Conner is currently in the action stage of change. As such, her goal is to continue with weight loss efforts.  She has agreed to the Category 1 plan.  Exercise goals: Older adults should follow the adult guidelines. When older adults cannot meet the adult guidelines, they should be as physically active as their abilities and conditions will allow.  She is walking on a regular basis.   Behavioral modification strategies: increasing lean protein intake, decreasing simple carbohydrates , no meal skipping, increase water intake, planning for success, increasing vegetables, keep healthy foods in the home, and mindful eating.  Suzanne Conner has agreed to follow-up with our clinic in 4 weeks.       Objective:   VITALS: Per patient if applicable, see vitals. GENERAL: Alert and in  no acute distress. CARDIOPULMONARY: No increased WOB. Speaking in clear sentences.  PSYCH: Pleasant and cooperative. Speech normal rate and rhythm. Affect is appropriate. Insight and judgement are appropriate. Attention is focused, linear, and appropriate.  NEURO: Oriented as arrived to appointment on time with no prompting.   Attestation Statements:    This was prepared with the assistance of Engineer, civil (consulting).  Occasional wrong-word or sound-a-like substitutions may have occurred due to the inherent limitations of voice recognition   Corinna Capra, DO

## 2023-08-12 ENCOUNTER — Ambulatory Visit (HOSPITAL_BASED_OUTPATIENT_CLINIC_OR_DEPARTMENT_OTHER): Payer: Medicare Other | Admitting: Pulmonary Disease

## 2023-08-12 ENCOUNTER — Encounter (HOSPITAL_BASED_OUTPATIENT_CLINIC_OR_DEPARTMENT_OTHER): Payer: Self-pay | Admitting: Pulmonary Disease

## 2023-08-12 VITALS — BP 138/70 | HR 79 | Ht 62.0 in | Wt 170.0 lb

## 2023-08-12 DIAGNOSIS — G4733 Obstructive sleep apnea (adult) (pediatric): Secondary | ICD-10-CM

## 2023-08-12 DIAGNOSIS — J4540 Moderate persistent asthma, uncomplicated: Secondary | ICD-10-CM

## 2023-08-12 DIAGNOSIS — J4520 Mild intermittent asthma, uncomplicated: Secondary | ICD-10-CM | POA: Diagnosis not present

## 2023-08-12 DIAGNOSIS — D869 Sarcoidosis, unspecified: Secondary | ICD-10-CM | POA: Diagnosis not present

## 2023-08-12 LAB — PULMONARY FUNCTION TEST
DL/VA % pred: 118 %
DL/VA: 4.91 ml/min/mmHg/L
DLCO cor % pred: 75 %
DLCO cor: 13.44 ml/min/mmHg
DLCO unc % pred: 75 %
DLCO unc: 13.44 ml/min/mmHg
FEF 25-75 Post: 1.08 L/s
FEF 25-75 Pre: 0.78 L/s
FEF2575-%Change-Post: 38 %
FEF2575-%Pred-Post: 72 %
FEF2575-%Pred-Pre: 52 %
FEV1-%Change-Post: 9 %
FEV1-%Pred-Post: 67 %
FEV1-%Pred-Pre: 61 %
FEV1-Post: 1.28 L
FEV1-Pre: 1.16 L
FEV1FVC-%Change-Post: 6 %
FEV1FVC-%Pred-Pre: 95 %
FEV6-%Change-Post: 3 %
FEV6-%Pred-Post: 70 %
FEV6-%Pred-Pre: 67 %
FEV6-Post: 1.68 L
FEV6-Pre: 1.62 L
FEV6FVC-%Pred-Post: 105 %
FEV6FVC-%Pred-Pre: 105 %
FVC-%Change-Post: 3 %
FVC-%Pred-Post: 66 %
FVC-%Pred-Pre: 64 %
FVC-Post: 1.68 L
FVC-Pre: 1.62 L
Post FEV1/FVC ratio: 76 %
Post FEV6/FVC ratio: 100 %
Pre FEV1/FVC ratio: 71 %
Pre FEV6/FVC Ratio: 100 %
RV % pred: 72 %
RV: 1.6 L
TLC % pred: 71 %
TLC: 3.41 L

## 2023-08-12 MED ORDER — INCRUSE ELLIPTA 62.5 MCG/ACT IN AEPB: 1.0000 | INHALATION_SPRAY | Freq: Every day | RESPIRATORY_TRACT | 2 refills | Status: DC

## 2023-08-12 MED ORDER — FLUTICASONE-SALMETEROL 500-50 MCG/ACT IN AEPB: INHALATION_SPRAY | RESPIRATORY_TRACT | 3 refills | Status: DC

## 2023-08-12 MED ORDER — ALBUTEROL SULFATE HFA 108 (90 BASE) MCG/ACT IN AERS: 2.0000 | INHALATION_SPRAY | Freq: Four times a day (QID) | RESPIRATORY_TRACT | 2 refills | Status: DC | PRN

## 2023-08-12 NOTE — Progress Notes (Signed)
Subjective:   PATIENT ID: Suzanne Conner GENDER: female DOB: [birth year], MRN: [id number]   HPI  Chief Complaint  Patient presents with   Follow-up    6 month follow up , pt stated that she hasn't seen any improvement since the last time she was see, pt stated that she hasn't been Cpap  since her husband as been sick.     Reason for Visit: Follow-up  Ms. Suzanne Conner is a 77 year old female never smoker with childhood asthma, diabetes (diet controlled), hypertension and GERD who presents for follow-up  Synopsis:  She was previously seen by a Pulmonary doctor in [place] 22 years ago for sarcoid diagnosed via bronchoscopy. She is unable to recall any prolonged treatment. Has not had any respiratory issues since then. Was only seen for ~1 year and reports CXR improved during that time which is suggestive Lofgren syndrome. Denies skin involvement except for seborrheic keratosis. Sees an ophthalmologist for high optic pressures.   On initial consultation had shortness of breath and wheezing x 2 months. She lives in a two story home and now having difficulty walking up.   2022 - Improved symptoms on medium dose Advair however still not much stamina since the pandemic. Completed Pulm Rehab 2023 - Persistent shortness of breath. Reported frequent nighttime awakenings and am headaches. Dx with mild OSA and started on CPAP. Improved asthma symptoms on Wixela 500  11/02/22 Since our last visit she is on high dose Advair. She is using albuterol 1-2 times month. She continues to have shortness of breath with moderate exertion. Denies wheezing or coughing. Compliant with CPAP nightly, unsure of improved dypsnea but does have improved quality of sleep. No longer have nocturnal awakenings. Has used mouth tape while on her nasal prongs.  08/12/23 Since our last visit she had covid in May and treated with paxlovid. She uses her Wixela twice a day a majority of the week with 2-3 days once daily. Denies  cough, sputum production, wheezing. Does have some shortness of breath. Has not been able to use her CPAP due to her recent hospitalizations and has let it lapse. Previously had good quality sleep when she was on the machine.  Asthma Control Test ACT Total Score  08/12/2023  2:41 PM 18  02/22/2022 10:40 AM 17  01/04/2021  2:14 PM 16   Review of Systems  Constitutional:  Negative for chills, diaphoresis, fever, malaise/fatigue and weight loss.  HENT:  Negative for congestion.   Respiratory:  Positive for shortness of breath. Negative for cough, hemoptysis, sputum production and wheezing.   Cardiovascular:  Negative for chest pain, palpitations and leg swelling.   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 Husbands health worsening No pets  Past Medical History:  Diagnosis Date   Arthritis    "left hip; right wrist" (06/12/2018)   Asthma    Depression    Family history of polyps in the colon    GERD (gastroesophageal reflux disease)    Glaucoma    Hypertension    Paroxysmal SVT (supraventricular tachycardia) (HCC) 03/23/2022   2 Supraventricular Tachycardia runs occurred, the run with the fastest interval lasting 19 beats with a max rate of 218 bpm    Type 2 diabetes, diet controlled (HCC)      Allergies  Allergen Reactions   Paxil [Paroxetine]     Paxil 20 mg caused a tremor.   Penicillins Hives and Other (See Comments)    PATIENT HAS HAD A PCN REACTION WITH IMMEDIATE RASH,  FACIAL/TONGUE/THROAT SWELLING, SOB, OR LIGHTHEADEDNESS WITH HYPOTENSION:  #  #  YES  #  #  INCLUDING HIVES AS A CHILD Has patient had a PCN reaction causing severe rash involving mucus membranes or skin necrosis: No Has patient had a PCN reaction that required hospitalization: No Has patient had a PCN reaction occurring within the last 10 years: No If all of the above answers are "NO", then may proceed with Cephalosporin use. PATIENT HAS HAD A PCN REACTION WITH IMMEDIATE RASH, FACIAL/TONGUE/THROAT  SWELLING, SOB, OR LIGHTHEADEDNESS WITH HYPOTENSION:  #  #  YES  #  #  INCLUDING HIVES AS A CHILD Has patient had a PCN reaction causing severe rash involving mucus membranes or skin necrosis: No Has patient had a PCN reaction that required hospitalization: No Has patient had a PCN reaction occurring within the last 10 years: No If all of the above answers are "NO", then may proceed with Cephalosporin use.    Atorvastatin     Joint pain on 40mg    Simvastatin     Joint pain on 20mg  daily     Outpatient Medications Prior to Visit  Medication Sig Dispense Refill   aspirin EC 81 MG tablet Take 81 mg by mouth daily.     Cholecalciferol (VITAMIN D) 2000 units CAPS Take 2,000 Units by mouth daily.     clindamycin (CLEOCIN) 300 MG capsule Take 300 mg by mouth 2 (two) times daily. Prior to dental appointments     Clobetasol Propionate 0.05 % shampoo Use a small amount on the affected areas twice a day as needed. 118 mL 0   empagliflozin (JARDIANCE) 10 MG TABS tablet Take 1 tablet (10 mg total) by mouth daily before breakfast. 90 tablet 3   fexofenadine (ALLEGRA) 180 MG tablet Take 1 tablet (180 mg total) by mouth daily. 30 tablet 3   hydrocortisone valerate cream (WESTCORT) 0.2 % Apply 1 application topically 2 (two) times daily. 45 g 0   latanoprost (XALATAN) 0.005 % ophthalmic solution Place 1 drop into both eyes at bedtime.     Melatonin 10 MG TABS Take 1 tablet by mouth at bedtime as needed.     Multiple Vitamins-Minerals (CENTRUM SILVER PO) Take 1 tablet by mouth daily.      nebivolol (BYSTOLIC) 5 MG tablet Take 1 tablet (5 mg total) by mouth at bedtime. 90 tablet 3   omeprazole (PRILOSEC) 40 MG capsule TAKE 1 CAPSULE TWICE A DAY 180 capsule 3   ondansetron (ZOFRAN) 4 MG tablet Take 1 tablet (4 mg total) by mouth every 8 (eight) hours as needed for nausea or vomiting. 30 tablet 0   Probiotic Product (TRUNATURE DIGESTIVE PROBIOTIC) CAPS Take 1 capsule by mouth daily.      rosuvastatin (CRESTOR)  5 MG tablet TAKE 1 TABLET DAILY 90 tablet 3   telmisartan (MICARDIS) 40 MG tablet TAKE 1 TABLET DAILY 90 tablet 2   tirzepatide (MOUNJARO) 12.5 MG/0.5ML Pen Inject 12.5 mg into the skin once a week. 2 mL 0   fluticasone-salmeterol (WIXELA INHUB) 500-50 MCG/ACT AEPB USE 1 INHALATION IN THE MORNING AND AT BEDTIME 180 each 3   OPTIVE 0.5-0.9 % ophthalmic solution Place 1 drop into both eyes 2 (two) times daily. (Patient not taking: Reported on 08/12/2023)     RESTASIS 0.05 % ophthalmic emulsion Place 1 drop into both eyes in the morning and at bedtime. (Patient not taking: Reported on 08/12/2023)     No facility-administered medications prior to visit.    Objective:  Vitals:   08/12/23 1438  BP: 138/70  Pulse: 79  SpO2: 94%  Weight: 170 lb (77.1 kg)  Height: 5\' 2"  (1.575 m)   SpO2: 94 %  Physical Exam: General: Well-appearing, no acute distress HENT: [place], AT Eyes: EOMI, no scleral icterus Respiratory: Clear to auscultation bilaterally.  No crackles, wheezing or rales Cardiovascular: RRR, -M/R/G, no JVD Extremities:-Edema,-tenderness Neuro: AAO x4, CNII-XII grossly intact Psych: Normal mood, normal affect  Data Reviewed:  Imaging: CXR 07/12/2020-no infiltrate, effusion or edema. CT Coronary 03/20/22 - Visualized lung parenchyma with no pulmonary nodules, masses, infiltrate, effusion or pneumothorax. Large hiatal hernia  PFT: 01/04/21 FVC 1.49 (62%) FEV1 1.10 (59%) Ratio 73  TLC 63% DLCO 62% Interpretation: Mixed obstructive and restrictive defect with mildly reduced DLCO. No significant bronchodilator response however does not preclude benefit of bronchodilators  05/04/22 FVC 1.36(45%) FEV1 1.06(45%) Ratio 74  TLC 63% DLCO 58% Interpretation: Mixed obstructive and restrictive defect with mildly reduced DLCO. Significant FEV1 bronchodilator response. Compared to 2022, reduced FEV1.  08/12/23 FVC 1.68 (66%) FEV1 1.28 (67%) Ratio 71  TLC 71% DLCO 75% Interpretation: Moderate  restrictive defect with mildly reduced DLCO. Partial bronchodilator response   Labs: CBC    Component Value Date/Time   WBC 6.7 04/19/2023 1134   RBC 4.78 04/19/2023 1134   HGB 13.3 04/19/2023 1134   HGB 12.7 11/12/2022 1444   HCT 41.6 04/19/2023 1134   HCT 39.3 11/12/2022 1444   PLT 311.0 04/19/2023 1134   MCV 87.0 04/19/2023 1134   MCH 27.7 03/18/2020 1135   MCHC 32.0 04/19/2023 1134   RDW 14.9 04/19/2023 1134   LYMPHSABS 2.1 04/19/2023 1134   MONOABS 0.5 04/19/2023 1134   EOSABS 0.0 04/19/2023 1134   BASOSABS 0.0 04/19/2023 1134   Absolute eos  03/18/20-70 05/11/21 - 100  Sleep: HST 04/19/22 - Mild OSA. AHI 6.7. Nadir SpO2 77%. Mean SpO2 92%  CPAP compliance 10/03/22 - 11/01/22 >4 hours 30/30 days <4 hours 29 days (97%)      Assessment & Plan:   Discussion: 77 year old female never smoker with severe asthma, pulmonary sarcoid/Lofgren's, OSA, DM2 who presents for follow-up. Well controlled on high dose ICS/LABA but some breakthrough wheezing and shortness of breath. Likely related to asthma and uncontrolled OSA  Mild OSA --Encourage restarting to help with shortness of breath --Counseled NOT to drive if/when sleepy  Severe persistent asthma - improved symptoms but persistent shortness of breath?wheeze - START Incruse ONE puff ONCE a day - CONTINUE Wixela Diskus 500-50 ONE puff TWICE a day. REFILL - CONTINUE Albuterol TWO puffs as needed for shortness of breath or wheezing - Reviewed PFTs. Improved obstruction and restriction and DLCO   Asthma action plan Use albuterol for worsening shortness of breath, wheezing and cough. If you symptoms do not improve in 24-48 hours, please our office for evaluation and/or prednisone taper.   Pulmonary sarcoidosis - Dx in 1999 via endobronchial bx. No records available - Hx of resolution in <1 year suggestive of Lofgren - No indication for prednisone therapy for now - Annual PFTs. Due 08/2024 - Recommend annual ophthalmology  exam. [facility] 02/22/22 - no sarcoid invovlement - Reviewed prior EKG. Prolonged P-R interval.  - Plan for repeat PFTs in 6-12 months pending clinical improvement (08/2024 at earliest)   Health Maintenance Immunization History  Administered Date(s) Administered   Fluad Quad(high Dose 65+) 06/22/2020, 07/06/2022   Influenza, High Dose Seasonal PF 05/12/2019, 06/10/2021   Influenza-Unspecified 05/29/2018, 05/12/2019   PFIZER(Purple Top)SARS-COV-2  Vaccination 10/01/2019, 10/22/2019, 06/17/2020, 12/08/2020   Pfizer Covid-19 Vaccine Bivalent Booster 6yrs & up 07/06/2022   Pneumococcal Polysaccharide-23 06/13/2018   Pneumococcal-Unspecified 07/06/2022   RSV,unspecified 07/06/2022   Zoster Recombinant(Shingrix) 03/10/2019, 05/12/2019   Zoster, Live 04/27/2008   CT Lung Screen - not indicated  No orders of the defined types were placed in this encounter.  Meds ordered this encounter  Medications   umeclidinium bromide (INCRUSE ELLIPTA) 62.5 MCG/ACT AEPB    Sig: Inhale 1 puff into the lungs daily.    Dispense:  30 each    Refill:  2   fluticasone-salmeterol (WIXELA INHUB) 500-50 MCG/ACT AEPB    Sig: USE 1 INHALATION IN THE MORNING AND AT BEDTIME    Dispense:  180 each    Refill:  3   albuterol (VENTOLIN HFA) 108 (90 Base) MCG/ACT inhaler    Sig: Inhale 2 puffs into the lungs every 6 (six) hours as needed for wheezing or shortness of breath.    Dispense:  8 g    Refill:  2    Return in about 5 months (around 01/10/2024).   I have spent a total time of 32-minutes on the day of the appointment including chart review, data review, collecting history, coordinating care and discussing medical diagnosis and plan with the patient/family. Past medical history, allergies, medications were reviewed. Pertinent imaging, labs and tests included in this note have been reviewed and interpreted independently by me.  Smiley Birr Mechele Collin, MD [facility] 08/12/2023  Office  Number [phone]

## 2023-08-12 NOTE — Patient Instructions (Signed)
Full PFT Performed Today  

## 2023-08-12 NOTE — Patient Instructions (Addendum)
Mild OSA --Encourage restarting to help with shortness of breath --Counseled NOT to drive if/when sleepy  Severe persistent asthma - improved symptoms but persistent shortness of breath?wheeze - START Incruse ONE puff ONCE a day - CONTINUE Wixela Diskus 500-50 ONE puff TWICE a day. REFILL - CONTINUE Albuterol TWO puffs as needed for shortness of breath or wheezing - Reviewed PFTs. Improved obstruction and restriction and DLCO

## 2023-08-12 NOTE — Progress Notes (Signed)
Full PFT Performed Today  

## 2023-08-19 ENCOUNTER — Ambulatory Visit (HOSPITAL_COMMUNITY): Payer: Medicare Other

## 2023-08-23 ENCOUNTER — Telehealth: Payer: Self-pay | Admitting: Pulmonary Disease

## 2023-08-23 MED ORDER — INCRUSE ELLIPTA 62.5 MCG/ACT IN AEPB: 1.0000 | INHALATION_SPRAY | Freq: Every day | RESPIRATORY_TRACT | 3 refills | Status: DC

## 2023-08-23 NOTE — Telephone Encounter (Signed)
Will order long term prescription of Incruse per patient request.

## 2023-08-26 ENCOUNTER — Telehealth: Payer: Self-pay | Admitting: Pulmonary Disease

## 2023-08-26 NOTE — Telephone Encounter (Signed)
Patient has no further questions. She realized the fluticasone-salmeterol is the same as her Wixela. Closing encounter.

## 2023-08-26 NOTE — Telephone Encounter (Signed)
Patient would like a call from the nurse in regards her medication fluticasone salmetdiscus 60. [phone]

## 2023-08-28 ENCOUNTER — Encounter: Payer: Self-pay | Admitting: Bariatrics

## 2023-08-28 ENCOUNTER — Ambulatory Visit: Payer: Medicare Other | Admitting: Bariatrics

## 2023-08-28 ENCOUNTER — Other Ambulatory Visit: Payer: Self-pay | Admitting: Bariatrics

## 2023-08-28 VITALS — BP 137/73 | HR 87 | Temp 97.8°F | Ht 62.0 in | Wt 163.0 lb

## 2023-08-28 DIAGNOSIS — E118 Type 2 diabetes mellitus with unspecified complications: Secondary | ICD-10-CM

## 2023-08-28 DIAGNOSIS — E782 Mixed hyperlipidemia: Secondary | ICD-10-CM

## 2023-08-28 DIAGNOSIS — Z6829 Body mass index (BMI) 29.0-29.9, adult: Secondary | ICD-10-CM

## 2023-08-28 DIAGNOSIS — E669 Obesity, unspecified: Secondary | ICD-10-CM

## 2023-08-28 DIAGNOSIS — Z7985 Long-term (current) use of injectable non-insulin antidiabetic drugs: Secondary | ICD-10-CM

## 2023-08-28 MED ORDER — TIRZEPATIDE 12.5 MG/0.5ML ~~LOC~~ SOAJ: 12.5000 mg | SUBCUTANEOUS | 0 refills | Status: DC

## 2023-08-28 NOTE — Progress Notes (Signed)
WEIGHT SUMMARY AND BIOMETRICS  Weight Lost Since Last Visit: 3lb  Weight Gained Since Last Visit: 0   Vitals Temp: 97.8 F (36.6 C) BP: 137/73 Pulse Rate: 87 SpO2: 100 %   Anthropometric Measurements Height: 5\' 2"  (1.575 m) Weight: 163 lb (73.9 kg) BMI (Calculated): 29.81 Weight at Last Visit: 166lb Weight Lost Since Last Visit: 3lb Weight Gained Since Last Visit: 0 Starting Weight: 197lb Total Weight Loss (lbs): 34 lb (15.4 kg)   Body Composition  Body Fat %: 42.1 % Fat Mass (lbs): 68.6 lbs Muscle Mass (lbs): 89.6 lbs Total Body Water (lbs): 62.6 lbs Visceral Fat Rating : 12   Other Clinical Data Fasting: no Labs: no Today's Visit #: 19 Starting Date: 03/22/22    OBESITY Suzanne Conner is here to discuss her progress with her obesity treatment plan along with follow-up of her obesity related diagnoses.    Nutrition Plan: the Category 1 plan - 90% adherence.  Current exercise: walking  Interim History:  She is down an additional 3 lbs since her last visit.  Protein intake is as prescribed, Water intake is adequate., Reports polyphagia, and Denies excessive cravings.   Pharmacotherapy: Suzanne Conner is on Mounjaro 12.5 mg SQ weekly Adverse side effects: None Hunger is moderately controlled.  Cravings are well controlled.  Assessment/Plan:   Type II Diabetes HgbA1c is at goal. Last A1c was 5.5 Episodes of hypoglycemia: no Medication(s): Mounjaro 12.5 mg SQ weekly  Lab Results  Component Value Date   HGBA1C 5.5 04/19/2023   HGBA1C 5.4 09/21/2022   HGBA1C 6.0 (H) 03/22/2022   Lab Results  Component Value Date   MICROALBUR <0.7 04/19/2023   LDLCALC 69 04/19/2023   CREATININE 0.70 04/19/2023   Lab Results  Component Value Date   GFR 84.42 04/19/2023   GFR 87.12 05/11/2021   GFR 84.48 10/05/2020    Plan: Continue and refill Mounjaro 12.5  mg SQ weekly Continue all other medications.  Will keep all carbohydrates low both sweets and starches.  Will continue exercise regimen to 30 to 60 minutes on most days of the week.  Aim for 7 to 9 hours of sleep nightly.  Eat more low glycemic index foods.     Morbid Obesity: Current BMI BMI (Calculated): 29.81   Pharmacotherapy Plan Continue and refill  Mounjaro 12.5 mg SQ weekly  Suzanne Conner is currently in the action stage of change. As such, her goal is to continue with weight loss efforts.  She has agreed to the Category 1 plan.  Exercise goals: Older adults should follow the adult guidelines. When older adults cannot meet the adult guidelines, they should be as physically active as their abilities and conditions will allow.  She is walking during the time that her spouse is in cardiac rehab.  Behavioral modification strategies: increasing lean protein intake, decreasing simple carbohydrates , no meal skipping, meal planning ,  increase water intake, better snacking choices, planning for success, avoiding temptations, and mindful eating.  Suzanne Conner has agreed to follow-up with our clinic in 4 weeks.      Objective:   VITALS: Per patient if applicable, see vitals. GENERAL: Alert and in no acute distress. CARDIOPULMONARY: No increased WOB. Speaking in clear sentences.  PSYCH: Pleasant and cooperative. Speech normal rate and rhythm. Affect is appropriate. Insight and judgement are appropriate. Attention is focused, linear, and appropriate.  NEURO: Oriented as arrived to appointment on time with no prompting.   Attestation Statements:   This was prepared with the assistance of Engineer, civil (consulting).  Occasional wrong-word or sound-a-like substitutions may have occurred due to the inherent limitations of voice recognition    Suzanne Capra, DO

## 2023-09-16 ENCOUNTER — Telehealth (HOSPITAL_COMMUNITY): Payer: Self-pay | Admitting: *Deleted

## 2023-09-16 NOTE — Telephone Encounter (Signed)
 Reaching out to patient to offer assistance regarding upcoming cardiac imaging study; pt verbalizes understanding of appt date/time, parking situation and where to check in, and verified current allergies; name and call back number provided for further questions should they arise  Chantal Requena RN Navigator Cardiac Imaging [facility] [phone] office [phone] cell  Patient is unsure about claustrophobia but reports a pin her foot, knee replacement and a shoulder replacement.

## 2023-09-17 ENCOUNTER — Other Ambulatory Visit: Payer: Self-pay | Admitting: Internal Medicine

## 2023-09-17 ENCOUNTER — Ambulatory Visit (HOSPITAL_COMMUNITY)
Admission: RE | Admit: 2023-09-17 | Discharge: 2023-09-17 | Disposition: A | Discharge status: Home / Self Care | Payer: Medicare Other | Source: Ambulatory Visit | Attending: Internal Medicine | Admitting: Internal Medicine

## 2023-09-17 DIAGNOSIS — R0609 Other forms of dyspnea: Secondary | ICD-10-CM

## 2023-09-17 MED ORDER — GADOBUTROL 1 MMOL/ML IV SOLN
8.0000 mL | Freq: Once | INTRAVENOUS | Status: AC | PRN
  Administered 2023-09-17: 8 mL via INTRAVENOUS

## 2023-09-24 ENCOUNTER — Other Ambulatory Visit: Payer: Self-pay | Admitting: Internal Medicine

## 2023-09-24 ENCOUNTER — Telehealth: Payer: Self-pay | Admitting: Bariatrics

## 2023-09-24 NOTE — Telephone Encounter (Signed)
 Pt is having transportation issues and would like to speak with a nurse. Her appt is schedule for tomorrow.

## 2023-09-25 ENCOUNTER — Ambulatory Visit: Payer: Medicare Other | Admitting: Bariatrics

## 2023-09-25 ENCOUNTER — Other Ambulatory Visit: Payer: Self-pay | Admitting: Bariatrics

## 2023-09-25 DIAGNOSIS — E118 Type 2 diabetes mellitus with unspecified complications: Secondary | ICD-10-CM

## 2023-09-25 MED ORDER — ONDANSETRON HCL 4 MG PO TABS: 4.0000 mg | ORAL_TABLET | Freq: Three times a day (TID) | ORAL | 0 refills | Status: DC | PRN

## 2023-09-25 MED ORDER — TIRZEPATIDE 12.5 MG/0.5ML ~~LOC~~ SOAJ: 12.5000 mg | SUBCUTANEOUS | 0 refills | Status: DC

## 2023-09-27 NOTE — Progress Notes (Unsigned)
Cardiology Office Note:   Date:  10/03/2023  ID:  [name], DOB 09/15/1945, MRN [id number] PCP:  Deeann Saint, MD  [facility] Providers Cardiologist:  Alverda Skeans, MD Referring MD: Deeann Saint, MD  Chief Complaint/Reason for Referral: Cardiology follow-up ASSESSMENT:    1. Dyspnea on exertion   2. Essential hypertension   3. Hyperlipidemia LDL goal <70   4. Coronary artery disease involving native coronary artery of native heart without angina pectoris   5. BMI 35.0-35.9,adult     PLAN:   In order of problems listed above: Dyspnea: She has had a reassuring cardiac evaluation.  I suspect this is likely due to to her body habitus, mild sleep apnea, asthma, and pulmonary sarcoidosis.  The fact that her dyspnea improves with her inhaler would suggest a pulmonary etiology. Hypertension: Blood pressure is well-controlled today. Hyperlipidemia: Lipid panel recently was at goal. Coronary artery disease: Mild; continue aspirin and statin. Elevated BMI: Currently on Mounjaro            Dispo:  Return in about 6 months (around 04/01/2024).      Medication Adjustments/Labs and Tests Ordered: Current medicines are reviewed at length with the patient today.  Concerns regarding medicines are outlined above.  The following changes have been made:  no change   Labs/tests ordered: Orders Placed This Encounter  Procedures   EKG 12-Lead    Medication Changes: No orders of the defined types were placed in this encounter.   Current medicines are reviewed at length with the patient today.  The patient does not have concerns regarding medicines.  I spent 31 minutes reviewing all clinical data during and prior to this visit including all relevant imaging studies, laboratories, clinical information from other health systems and prior notes from both Cardiology and other specialties, interviewing the patient, conducting a complete physical examination, and coordinating  care in order to formulate a comprehensive and personalized evaluation and treatment plan.   History of Present Illness:      FOCUSED PROBLEM LIST:   Type 2 diabetes mellitus Not on insulin Hypertension Hyperlipidemia LP(a) 21.9 Mild coronary artery disease Coronary CTA 2023 Pulmonary sarcoidosis CMR 2025 negative for cardiac sarcoidosis Obstructive sleep apnea on CPAP BMI 35 On Mounjaro Hiatal hernia Noted on coronary CTA 2023   November 2022 consultation:  Suzanne Conner is a 78 y.o. female with the indicated history patient was seen in October by her primary care provider.  At that time she was endorsing shortness of breath but no exertional chest pain.  Her blood pressure was mildly elevated above goal.  Her metoprolol was stopped because this was thought to be contributing to her symptoms possibly.  She was started on telmisartan.   She tells me that she has been more short of breath over the last several months.  This typically happens happens with exertion.  She has gained about approximately 20 pounds since the pandemic started.  She has noticed this shortness of breath happening near the end of the pandemic.  She has also had an exacerbation of her childhood asthma.  She is now on medication for this.  She occasionally wheezes but this is pretty rare.  This shortness of breath she is endorsing today is separate from her wheezing.  She denies any significant edema, paroxysmal nocturnal dyspnea, orthopnea, exertional angina, presyncope or syncope.  She has very rare palpitations that last seconds and may be happening once or twice a week.  They are not not  associated with lightheadedness, shortness of breath, or chest pain.  She has required no emergency room visits or hospitalizations.  Plan: Echocardiogram, start Jardiance, start atorvastatin 40 mg daily and empiric lasix.   February 2023:  The patient is a 78 y.o. female with the indicated medical history here for follow-up.  I  first saw her in November 2022.  She was complaining of dyspnea.  We started empiric Lasix which helped her breathing.  She was started on Jardiance and atorvastatin as well.   Her breathing is mildly improved with Lasix.  She still feels short of breath with exertion at times.  She denies any chest pain, palpitations, paroxysmal nocturnal dyspnea, orthopnea.  She has required no emergency room visits or hospitalizations.  In terms of her weight she is a stress eater and tells me that her weight is gone up and down.  When reviewing the record she is gained about 10 pounds in the last few years.  Plan: Change atorvastatin to simvastatin and increase Micardis to 40 mg; stop hydrochlorothiazide; refer to pharmacy for elevated BMI   3/24: In the interim since I saw her last the patient had a monitor placed which demonstrated rare asymptomatic SVT and no atrial fibrillation.  She was placed on Bystolic.  She is seen pulmonology as well and was started on inhalers.  When she was seen in September a BNP was checked and found to be normal.  It was thought her dyspnea was from a pulmonary component.  She was seen by pulmonology recently was thought to have severe persistent asthma, pulmonary sarcoidosis, and mild obstructive sleep apnea.  The patient continues to struggle with shortness of breath.  She had her asthma medications intensified which has helped.  She has noticed that occasionally when she gets short of breath she does wheeze.  Unfortunately is continues to limit her from losing weight.  She fortunately has not required any emergency room visits or hospitalizations.  She has developed some headaches in response to nebivolol which was started because of occasional SVT.  She has noticed no palpitations since starting this medication.  Plan: Obtain CMR to evaluate for cardiac sarcoidosis, change Bystolic to q. bedtime dosing, check lipid panel, LFTs, LP(a); refer to pharmacy for GLP-1 receptor agonist  therapy.  1/25: The patient's LDL was at goal and her LP(a) was not elevated.  Her CMR demonstrated no evidence of sarcoidosis.  The patient has lost about 30 pounds with Mounjaro.  She feels much better.  Breathing is much improved.  She sometimes gets short of breath and people tell her she is wheezing.  When she takes her rescue inhaler she will feel much better.  She denies any cardiovascular complaints.  She has been compliant with her medications.  She typically checks her blood pressures and they are usually in the 130s under 80s.  She fortunately has not required any emergency room visits or hospitalizations.  She is otherwise well and without significant complaints.          Current Medications: Current Meds  Medication Sig   albuterol (VENTOLIN HFA) 108 (90 Base) MCG/ACT inhaler Inhale 2 puffs into the lungs every 6 (six) hours as needed for wheezing or shortness of breath.   aspirin EC 81 MG tablet Take 81 mg by mouth daily.   Cholecalciferol (VITAMIN D) 2000 units CAPS Take 2,000 Units by mouth daily.   clindamycin (CLEOCIN) 300 MG capsule Take 300 mg by mouth 2 (two) times daily. Prior to dental appointments  Clobetasol Propionate 0.05 % shampoo Use a small amount on the affected areas twice a day as needed.   empagliflozin (JARDIANCE) 10 MG TABS tablet Take 1 tablet (10 mg total) by mouth daily before breakfast.   fluticasone-salmeterol (WIXELA INHUB) 500-50 MCG/ACT AEPB USE 1 INHALATION IN THE MORNING AND AT BEDTIME   latanoprost (XALATAN) 0.005 % ophthalmic solution Place 1 drop into both eyes at bedtime.   Melatonin 10 MG TABS Take 1 tablet by mouth at bedtime as needed.   Multiple Vitamins-Minerals (CENTRUM SILVER PO) Take 1 tablet by mouth daily.    nebivolol (BYSTOLIC) 5 MG tablet Take 1 tablet (5 mg total) by mouth at bedtime.   omeprazole (PRILOSEC) 40 MG capsule TAKE 1 CAPSULE TWICE A DAY   ondansetron (ZOFRAN) 4 MG tablet Take 1 tablet (4 mg total) by mouth every 8  (eight) hours as needed for nausea or vomiting.   OPTIVE 0.5-0.9 % ophthalmic solution Place 1 drop into both eyes 2 (two) times daily.   Probiotic Product (TRUNATURE DIGESTIVE PROBIOTIC) CAPS Take 1 capsule by mouth daily.    RESTASIS 0.05 % ophthalmic emulsion Place 1 drop into both eyes in the morning and at bedtime.   rosuvastatin (CRESTOR) 5 MG tablet TAKE 1 TABLET DAILY   telmisartan (MICARDIS) 40 MG tablet TAKE 1 TABLET DAILY   tirzepatide (MOUNJARO) 12.5 MG/0.5ML Pen Inject 12.5 mg into the skin once a week.   umeclidinium bromide (INCRUSE ELLIPTA) 62.5 MCG/ACT AEPB Inhale 1 puff into the lungs daily.   umeclidinium bromide (INCRUSE ELLIPTA) 62.5 MCG/ACT AEPB Inhale 1 puff into the lungs daily.     Review of Systems:   Please see the history of present illness.    All other systems reviewed and are negative.     EKGs/Labs/Other Test Reviewed:   EKG:    EKG Interpretation Date/Time:  Thursday October 03 2023 08:11:12 EST Ventricular Rate:  84 PR Interval:  140 QRS Duration:  72 QT Interval:  364 QTC Calculation: 430 R Axis:   80  Text Interpretation: Normal sinus rhythm Nonspecific ST abnormality When compared with ECG of 30-May-2018 08:34, Nonspecific T wave abnormality now evident in Lateral leads Confirmed by Alverda Skeans ([id number]) on 10/03/2023 8:14:57 AM         Risk Assessment/Calculations:          Physical Exam:   VS:  BP 138/60   Pulse 84   Ht 5\' 2"  (1.575 m)   Wt 166 lb (75.3 kg)   SpO2 99%   BMI 30.36 kg/m     Repeat blood pressure 125/70     Wt Readings from Last 3 Encounters:  10/03/23 166 lb (75.3 kg)  08/28/23 163 lb (73.9 kg)  08/12/23 170 lb (77.1 kg)      GENERAL:  No apparent distress, AOx3 HEENT:  No carotid bruits, +2 carotid impulses, no scleral icterus CAR: RRR no murmurs, gallops, rubs, or thrills RES:  Clear to auscultation bilaterally ABD:  Soft, nontender, nondistended, positive bowel sounds x 4 VASC:  +2 radial pulses, +2  carotid pulses NEURO:  CN 2-12 grossly intact; motor and sensory grossly intact PSYCH:  No active depression or anxiety EXT:  No edema, ecchymosis, or cyanosis  Signed, Orbie Pyo, MD  10/03/2023 8:44 AM    [facility] [address] [place], [place], [place]  [address] Phone: [phone]; Fax: [phone]   Note:  This document was prepared using Dragon voice recognition software and may  include unintentional dictation errors.

## 2023-10-03 ENCOUNTER — Ambulatory Visit: Payer: Medicare Other | Attending: Internal Medicine | Admitting: Internal Medicine

## 2023-10-03 ENCOUNTER — Encounter: Payer: Self-pay | Admitting: Internal Medicine

## 2023-10-03 VITALS — BP 138/60 | HR 84 | Ht 62.0 in | Wt 166.0 lb

## 2023-10-03 DIAGNOSIS — Z6835 Body mass index (BMI) 35.0-35.9, adult: Secondary | ICD-10-CM | POA: Insufficient documentation

## 2023-10-03 DIAGNOSIS — R0609 Other forms of dyspnea: Secondary | ICD-10-CM | POA: Insufficient documentation

## 2023-10-03 DIAGNOSIS — E785 Hyperlipidemia, unspecified: Secondary | ICD-10-CM | POA: Diagnosis not present

## 2023-10-03 DIAGNOSIS — I10 Essential (primary) hypertension: Secondary | ICD-10-CM | POA: Diagnosis not present

## 2023-10-03 DIAGNOSIS — I2510 Atherosclerotic heart disease of native coronary artery without angina pectoris: Secondary | ICD-10-CM | POA: Insufficient documentation

## 2023-10-03 NOTE — Patient Instructions (Signed)
Medication Instructions:  Your physician recommends that you continue on your current medications as directed. Please refer to the Current Medication list given to you today.  *If you need a refill on your cardiac medications before your next appointment, please call your pharmacy*  Lab Work: None ordered today  Testing/Procedures: None ordered today  Follow-Up: At [facility], you and your health needs are our priority.  As part of our continuing mission to provide you with exceptional heart care, we have created designated Provider Care Teams.  These Care Teams include your primary Cardiologist (physician) and Advanced Practice Providers (APPs -  Physician Assistants and Nurse Practitioners) who all work together to provide you with the care you need, when you need it.  Your next appointment:   6 month(s)  The format for your next appointment:   In Person  Provider:   Scott Weaver, PA-C   

## 2023-10-21 ENCOUNTER — Ambulatory Visit (INDEPENDENT_AMBULATORY_CARE_PROVIDER_SITE_OTHER): Payer: Medicare Other | Admitting: Bariatrics

## 2023-10-21 ENCOUNTER — Encounter: Payer: Self-pay | Admitting: Bariatrics

## 2023-10-21 VITALS — BP 138/75 | HR 81 | Temp 97.7°F | Ht 62.0 in | Wt 161.0 lb

## 2023-10-21 DIAGNOSIS — E782 Mixed hyperlipidemia: Secondary | ICD-10-CM

## 2023-10-21 DIAGNOSIS — Z7985 Long-term (current) use of injectable non-insulin antidiabetic drugs: Secondary | ICD-10-CM | POA: Diagnosis not present

## 2023-10-21 DIAGNOSIS — Z6829 Body mass index (BMI) 29.0-29.9, adult: Secondary | ICD-10-CM | POA: Diagnosis not present

## 2023-10-21 DIAGNOSIS — E785 Hyperlipidemia, unspecified: Secondary | ICD-10-CM | POA: Diagnosis not present

## 2023-10-21 DIAGNOSIS — E669 Obesity, unspecified: Secondary | ICD-10-CM

## 2023-10-21 DIAGNOSIS — E118 Type 2 diabetes mellitus with unspecified complications: Secondary | ICD-10-CM

## 2023-10-21 DIAGNOSIS — E119 Type 2 diabetes mellitus without complications: Secondary | ICD-10-CM

## 2023-10-21 MED ORDER — TIRZEPATIDE 12.5 MG/0.5ML ~~LOC~~ SOAJ: 12.5000 mg | SUBCUTANEOUS | 0 refills | Status: DC

## 2023-10-21 NOTE — Progress Notes (Signed)
 WEIGHT SUMMARY AND BIOMETRICS  Weight Lost Since Last Visit: 2lb  Weight Gained Since Last Visit: 0   Vitals Temp: 97.7 F (36.5 C) BP: 138/75 Pulse Rate: 81 SpO2: 100 %   Anthropometric Measurements Height: 5\' 2"  (1.575 m) Weight: 161 lb (73 kg) BMI (Calculated): 29.44 Weight at Last Visit: 163lb Weight Lost Since Last Visit: 2lb Weight Gained Since Last Visit: 0 Starting Weight: 197lb Total Weight Loss (lbs): 36 lb (16.3 kg)   Body Composition  Body Fat %: 41.8 % Fat Mass (lbs): 67.4 lbs Muscle Mass (lbs): 89.2 lbs Total Body Water (lbs): 63.4 lbs Visceral Fat Rating : 12   Other Clinical Data Fasting: no Labs: no Today's Visit #: 20 Starting Date: 03/22/22    OBESITY Ambar is here to discuss her progress with her obesity treatment plan along with follow-up of her obesity related diagnoses.    Nutrition Plan: the Category 1 plan - 90% adherence.  Current exercise:  [employer]  Interim History:  She is down another 2 lbs since her last visit. She states that this is the smallest that she has been in a long time.  Eating all of the food on the plan., Protein intake is as prescribed, Is not skipping meals, Water intake is adequate., and Reports polyphagia   Pharmacotherapy: Pollyanne is on Mounjaro  12.5 mg SQ weekly Adverse side effects: None Hunger is moderately controlled.  Cravings are moderately controlled.  Assessment/Plan:    Type II Diabetes HgbA1c is at goal. Last A1c was 5.5 CBGs: Fasting 90 to 110       Episodes of hypoglycemia: no Medication(s): Mounjaro  12.5 mg SQ weekly  Lab Results  Component Value Date   HGBA1C 5.5 04/19/2023   HGBA1C 5.4 09/21/2022   HGBA1C 6.0 (H) 03/22/2022   Lab Results  Component Value Date   MICROALBUR <0.7 04/19/2023   LDLCALC 69 04/19/2023   CREATININE 0.70 04/19/2023   Lab Results   Component Value Date   GFR 84.42 04/19/2023   GFR 87.12 05/11/2021   GFR 84.48 10/05/2020    Plan: Continue and refill Mounjaro  12.5 mg SQ weekly Continue all other medications.  Will keep all carbohydrates low both sweets and starches.  Will continue exercise regimen to 30 to 60 minutes on most days of the week.  Aim for 7 to 9 hours of sleep nightly.  Eat more low glycemic index foods.  She will focus on her protein.  She will work on eating healthy snacks.    Hyperlipidemia LDL is at goal. Medication(s): Crestor  Cardiovascular risk factors: advanced age (older than 69 for men, 35 for women), diabetes mellitus, dyslipidemia, hypertension, obesity (BMI >= 30 kg/m2), and sedentary lifestyle  Lab Results  Component Value Date   CHOL 132 04/19/2023   HDL 45.50 04/19/2023   LDLCALC 69 04/19/2023   TRIG 85.0 04/19/2023   CHOLHDL 3  04/19/2023   Lab Results  Component Value Date   ALT 20 04/19/2023   AST 19 04/19/2023   ALKPHOS 58 04/19/2023   BILITOT 0.4 04/19/2023   The 10-year ASCVD risk score (Arnett DK, et al., 2019) is: 25.1%   Values used to calculate the score:     Age: 57 years     Sex: Female     Is Non-Hispanic African American: Yes     Diabetic: Yes     Tobacco smoker: No     Systolic Blood Pressure: 138 mmHg     Is BP treated: Yes     HDL Cholesterol: 45.5 mg/dL     Total Cholesterol: 132 mg/dL  Plan:  Continue statin.  Information sheet on healthy vs unhealthy fats.  Will avoid all trans fats.  Will read labels Will minimize saturated fats except the following: low fat meats in moderation, diary, and limited dark chocolate.  Increase Omega 3 in foods.       Generalized Obesity: Current BMI BMI (Calculated): 29.44   Pharmacotherapy Plan Continue and refill  Mounjaro  12.5 mg SQ weekly  Arisa is currently in the action stage of change. As such, her goal is to continue with weight loss efforts.  She has agreed to the Category 1  plan.  Exercise goals: Older adults should do exercises that maintain or improve balance if they are at risk of falling.   Behavioral modification strategies: increasing lean protein intake, decreasing simple carbohydrates , no meal skipping, decrease eating out, meal planning , increase water intake, better snacking choices, planning for success, increasing vegetables, avoiding temptations, keep healthy foods in the home, increase frequency of journaling, measure portion sizes, and mindful eating.  Monnette has agreed to follow-up with our clinic in 4 weeks.      Objective:   VITALS: Per patient if applicable, see vitals. GENERAL: Alert and in no acute distress. CARDIOPULMONARY: No increased WOB. Speaking in clear sentences.  PSYCH: Pleasant and cooperative. Speech normal rate and rhythm. Affect is appropriate. Insight and judgement are appropriate. Attention is focused, linear, and appropriate.  NEURO: Oriented as arrived to appointment on time with no prompting.   Attestation Statements:   This was prepared with the assistance of Engineer, civil (consulting).  Occasional wrong-word or sound-a-like substitutions may have occurred due to the inherent limitations of voice recognition   Kirk Peper, DO

## 2023-11-05 ENCOUNTER — Encounter: Payer: Self-pay | Admitting: Emergency Medicine

## 2023-11-05 ENCOUNTER — Ambulatory Visit
Admission: EM | Admit: 2023-11-05 | Discharge: 2023-11-05 | Disposition: A | Discharge status: Home / Self Care | Payer: Medicare Other | Attending: Internal Medicine | Admitting: Internal Medicine

## 2023-11-05 DIAGNOSIS — Z1152 Encounter for screening for COVID-19: Secondary | ICD-10-CM | POA: Diagnosis not present

## 2023-11-05 DIAGNOSIS — R519 Headache, unspecified: Secondary | ICD-10-CM | POA: Diagnosis not present

## 2023-11-05 DIAGNOSIS — B349 Viral infection, unspecified: Secondary | ICD-10-CM | POA: Diagnosis not present

## 2023-11-05 LAB — POC COVID19/FLU A&B COMBO
Covid Antigen, POC: NEGATIVE
Influenza A Antigen, POC: NEGATIVE
Influenza B Antigen, POC: NEGATIVE

## 2023-11-05 NOTE — Discharge Instructions (Signed)

## 2023-11-05 NOTE — ED Triage Notes (Addendum)
 Pt c/o chills, headache, and sore throat for 1 day. She began feeling fatigued and not well on Saturday.

## 2023-11-05 NOTE — ED Provider Notes (Signed)
 [name]    CSN: [id number] Arrival date & time: 11/05/23  1322      History   Chief Complaint Chief Complaint  Patient presents with   Chills   Headache   Sore Throat    HPI Suzanne Conner is a 78 y.o. female.   Patient presents to urgent care for evaluation of generalized headache, fatigue, chills, sore throat, and bodyaches that started 2 days ago.  She started feeling generally fatigued/ill 4 days ago.  She is not coughing and denies shortness of breath, chest pain, nausea, vomiting, diarrhea, abdominal pain, and rash.  No documented fever at home.  Sore throat is currently a 6 on a scale of 0-10 and worsened by swallowing.  No vision changes or dizziness.  No leg swelling or orthopnea.  History of asthma, no recent antibiotic or steroid use.  She has not needed her albuterol inhaler during this illness.  Never smoker.  Taking Tylenol with some relief of headache and sore throat.  BP elevated, takes    Headache Sore Throat Associated symptoms include headaches.    Past Medical History:  Diagnosis Date   Arthritis    "left hip; right wrist" (06/12/2018)   Asthma    Depression    Family history of polyps in the colon    GERD (gastroesophageal reflux disease)    Glaucoma    Hypertension    Paroxysmal SVT (supraventricular tachycardia) (HCC) 03/23/2022   2 Supraventricular Tachycardia runs occurred, the run with the fastest interval lasting 19 beats with a max rate of 218 bpm    Type 2 diabetes, diet controlled (HCC)     Patient Active Problem List   Diagnosis Date Noted   BMI 32.0-32.9,adult 10/18/2022   Moderate persistent asthma without complication 08/06/2022   Eating disorder 07/19/2022   Other constipation 06/28/2022   Polyphagia 06/07/2022   Class 2 severe obesity with serious comorbidity and body mass index (BMI) of 36.0 to 36.9 in adult (HCC) 06/07/2022   Shortness of breath 05/18/2022   Coronary artery disease involving native coronary  artery of native heart without angina pectoris 05/17/2022   Type 2 diabetes mellitus with obesity (HCC) 05/07/2022   OSA (obstructive sleep apnea) 05/04/2022   Paroxysmal SVT (supraventricular tachycardia) (HCC) 03/23/2022   Excessive daytime sleepiness 02/22/2022   Other fatigue 02/22/2022   Generalized obesity 11/15/2021   Hyperlipidemia 11/15/2021   Severe persistent asthma 03/21/2021   Pulmonary sarcoidosis (HCC) 03/21/2021   Seborrheic dermatitis 08/11/2020   Essential hypertension 03/26/2020   Strabismus 03/26/2020   Depression, major, single episode, mild (HCC) 03/26/2020   Chronic pain of multiple joints 03/26/2020   Anxiety 03/26/2020   S/p reverse total shoulder arthroplasty 06/12/2018   Gastroesophageal reflux disease 05/02/2018   Tear of left rotator cuff 05/02/2018   Controlled type 2 diabetes mellitus with complication, without long-term current use of insulin (HCC) 05/02/2018   S/P right rotator cuff repair 05/02/2018    Past Surgical History:  Procedure Laterality Date   BUNIONECTOMY Right    CATARACT EXTRACTION W/ INTRAOCULAR LENS IMPLANT Right    JOINT REPLACEMENT     REPLACEMENT TOTAL KNEE Right 2015   REVERSE SHOULDER ARTHROPLASTY Left 06/12/2018   REVERSE SHOULDER ARTHROPLASTY Left 06/12/2018   Procedure: LEFT REVERSE SHOULDER ARTHROPLASTY;  Surgeon: Francena Hanly, MD;  Location: [facility] OR;  Service: Orthopedics;  Laterality: Left;   SHOULDER ARTHROSCOPY W/ ROTATOR CUFF REPAIR Right 2015   TUBAL LIGATION      OB History  No obstetric history on file.      Home Medications    Prior to Admission medications   Medication Sig Start Date End Date Taking? Authorizing Provider  albuterol (VENTOLIN HFA) 108 (90 Base) MCG/ACT inhaler Inhale 2 puffs into the lungs every 6 (six) hours as needed for wheezing or shortness of breath. 08/12/23   Luciano Cutter, MD  aspirin EC 81 MG tablet Take 81 mg by mouth daily.    [provider]  Cholecalciferol  (VITAMIN D) 2000 units CAPS Take 2,000 Units by mouth daily.    [provider]  clindamycin (CLEOCIN) 300 MG capsule Take 300 mg by mouth 2 (two) times daily. Prior to dental appointments Patient not taking: Reported on 11/05/2023 10/26/20   [provider]  Clobetasol Propionate 0.05 % shampoo Use a small amount on the affected areas twice a day as needed. 03/18/20   Deeann Saint, MD  empagliflozin (JARDIANCE) 10 MG TABS tablet Take 1 tablet (10 mg total) by mouth daily before breakfast. 12/05/22   Orbie Pyo, MD  fexofenadine (ALLEGRA) 180 MG tablet Take 1 tablet (180 mg total) by mouth daily. Patient not taking: Reported on 11/05/2023 05/23/22   Deeann Saint, MD  fluticasone-salmeterol [facility] INHUB) 500-50 MCG/ACT AEPB USE 1 INHALATION IN THE MORNING AND AT BEDTIME Patient not taking: Reported on 11/05/2023 08/12/23   Luciano Cutter, MD  hydrocortisone valerate cream (WESTCORT) 0.2 % Apply 1 application topically 2 (two) times daily. 06/12/19   Deeann Saint, MD  latanoprost (XALATAN) 0.005 % ophthalmic solution Place 1 drop into both eyes at bedtime. 12/23/19   [provider]  Melatonin 10 MG TABS Take 1 tablet by mouth at bedtime as needed. Patient not taking: Reported on 11/05/2023    [provider]  Multiple Vitamins-Minerals (CENTRUM SILVER PO) Take 1 tablet by mouth daily.     [provider]  nebivolol (BYSTOLIC) 5 MG tablet Take 1 tablet (5 mg total) by mouth at bedtime. 11/12/22   Orbie Pyo, MD  omeprazole (PRILOSEC) 40 MG capsule TAKE 1 CAPSULE TWICE A DAY 11/28/22   Deeann Saint, MD  ondansetron (ZOFRAN) 4 MG tablet Take 1 tablet (4 mg total) by mouth every 8 (eight) hours as needed for nausea or vomiting. 09/25/23   Corinna Capra A, DO  OPTIVE 0.5-0.9 % ophthalmic solution Place 1 drop into both eyes 2 (two) times daily. 08/25/21   [provider]  Probiotic Product (TRUNATURE DIGESTIVE PROBIOTIC) CAPS Take 1  capsule by mouth daily.     [provider]  RESTASIS 0.05 % ophthalmic emulsion Place 1 drop into both eyes in the morning and at bedtime. 08/30/21   [provider]  rosuvastatin (CRESTOR) 5 MG tablet TAKE 1 TABLET DAILY 12/05/22   Orbie Pyo, MD  telmisartan (MICARDIS) 40 MG tablet TAKE 1 TABLET DAILY 09/24/23   Orbie Pyo, MD  tirzepatide [facility]) 12.5 MG/0.5ML Pen Inject 12.5 mg into the skin once a week. 10/21/23   Corinna Capra A, DO  umeclidinium bromide (INCRUSE ELLIPTA) 62.5 MCG/ACT AEPB Inhale 1 puff into the lungs daily. 08/12/23   Luciano Cutter, MD  umeclidinium bromide (INCRUSE ELLIPTA) 62.5 MCG/ACT AEPB Inhale 1 puff into the lungs daily. 08/23/23   Luciano Cutter, MD    Family History Family History  Problem Relation Age of Onset   Asthma Brother    Diabetes Brother    Dementia Mother    Cancer  Father    Heart disease Sister    Diabetes Brother    Diabetes Brother    Healthy Son    Healthy Daughter     Social History Social History   Tobacco Use   Smoking status: Never   Smokeless tobacco: Never  Vaping Use   Vaping status: Never Used  Substance Use Topics   Alcohol use: Yes    Alcohol/week: 5.0 standard drinks of alcohol    Types: 5 Glasses of wine per week   Drug use: Never    Comment: 06/12/2018 CBD oil for pain     Allergies   Paxil [paroxetine], Penicillins, Atorvastatin, and Simvastatin   Review of Systems Review of Systems  Neurological:  Positive for headaches.  Per HPI   Physical Exam Triage Vital Signs ED Triage Vitals [11/05/23 1329]  Encounter Vitals Group     BP (!) 165/91     Systolic BP Percentile      Diastolic BP Percentile      Pulse Rate 83     Resp 17     Temp 98.1 F (36.7 C)     Temp Source Oral     SpO2 98 %     Weight      Height      Head Circumference      Peak Flow      Pain Score 4     Pain Loc      Pain Education      Exclude from Growth Chart    No data  found.  Updated Vital Signs BP (!) 165/91 (BP Location: Right Arm)   Pulse 83   Temp 98.1 F (36.7 C) (Oral)   Resp 17   SpO2 98%   Visual Acuity Right Eye Distance:   Left Eye Distance:   Bilateral Distance:    Right Eye Near:   Left Eye Near:    Bilateral Near:     Physical Exam Vitals and nursing note reviewed.  Constitutional:      Appearance: She is not ill-appearing or toxic-appearing.  HENT:     Head: Normocephalic and atraumatic.     Right Ear: Hearing, tympanic membrane, ear canal and external ear normal.     Left Ear: Hearing, tympanic membrane, ear canal and external ear normal.     Nose: Nose normal.     Mouth/Throat:     Lips: Pink.     Mouth: Mucous membranes are moist. No injury or oral lesions.     Dentition: Normal dentition.     Tongue: No lesions.     Pharynx: Oropharynx is clear. Uvula midline. No pharyngeal swelling, oropharyngeal exudate, posterior oropharyngeal erythema, uvula swelling or postnasal drip.     Tonsils: No tonsillar exudate.  Eyes:     General: Lids are normal. Vision grossly intact. Gaze aligned appropriately.     Extraocular Movements: Extraocular movements intact.     Conjunctiva/sclera: Conjunctivae normal.  Neck:     Trachea: Trachea and phonation normal.  Cardiovascular:     Rate and Rhythm: Normal rate and regular rhythm.     Heart sounds: Normal heart sounds, S1 normal and S2 normal.  Pulmonary:     Effort: Pulmonary effort is normal. No respiratory distress.     Breath sounds: Normal breath sounds and air entry. No wheezing, rhonchi or rales.  Chest:     Chest wall: No tenderness.  Musculoskeletal:     Cervical back: Neck supple.  Lymphadenopathy:  Cervical: No cervical adenopathy.  Skin:    General: Skin is warm and dry.     Capillary Refill: Capillary refill takes less than 2 seconds.     Findings: No rash.  Neurological:     General: No focal deficit present.     Mental Status: She is alert and oriented to  person, place, and time. Mental status is at baseline.     Cranial Nerves: No dysarthria or facial asymmetry.  Psychiatric:        Mood and Affect: Mood normal.        Speech: Speech normal.        Behavior: Behavior normal.        Thought Content: Thought content normal.        Judgment: Judgment normal.      UC Treatments / Results  Labs (all labs ordered are listed, but only abnormal results are displayed) Labs Reviewed  POC COVID19/FLU A&B COMBO - Normal  SARS CORONAVIRUS 2 (TAT 6-24 HRS)    EKG   Radiology No results found.  Procedures Procedures (including critical care time)  Medications Ordered in UC Medications - No data to display  Initial Impression / Assessment and Plan / UC Course  I have reviewed the triage vital signs and the nursing notes.  Pertinent labs & imaging results that were available during my care of the patient were reviewed by me and considered in my medical decision making (see chart for details).   1. Acute viral syndrome Suspect viral URI, viral syndrome.  Strep/viral testing: POC COVID/flu testing negative.   Physical exam findings reassuring, vital signs hemodynamically stable, and lungs clear, therefore deferred imaging of the chest.  Advised supportive care/prescriptions for symptomatic relief as outlined in AVS.   Counseled patient on potential for adverse effects with medications prescribed/recommended today, strict ER and return-to-clinic precautions discussed, patient verbalized understanding.    Final Clinical Impressions(s) / UC Diagnoses   Final diagnoses:  Acute viral syndrome     Discharge Instructions      You have a viral illness which will improve on its own with rest, fluids, and medications to help with your symptoms.  Tylenol, guaifenesin (plain mucinex), and saline nasal sprays may help relieve symptoms.   Two teaspoons of honey in 1 cup of warm water every 4-6 hours may help with throat  pains.  Humidifier in room at nighttime may help soothe cough (clean well daily).   For chest pain, shortness of breath, inability to keep food or fluids down without vomiting, fever that does not respond to tylenol or motrin, or any other severe symptoms, please go to the ER for further evaluation. Return to urgent care as needed, otherwise follow-up with PCP.       ED Prescriptions   None    PDMP not reviewed this encounter.   Carlisle Beers, [place] 11/05/23 [phone]

## 2023-11-06 LAB — SARS CORONAVIRUS 2 (TAT 6-24 HRS): SARS Coronavirus 2: NEGATIVE

## 2023-11-18 ENCOUNTER — Ambulatory Visit (INDEPENDENT_AMBULATORY_CARE_PROVIDER_SITE_OTHER): Payer: Medicare Other | Admitting: Bariatrics

## 2023-11-18 ENCOUNTER — Encounter: Payer: Self-pay | Admitting: Bariatrics

## 2023-11-18 VITALS — BP 134/64 | HR 70 | Temp 97.7°F | Ht 62.0 in | Wt 159.0 lb

## 2023-11-18 DIAGNOSIS — E119 Type 2 diabetes mellitus without complications: Secondary | ICD-10-CM

## 2023-11-18 DIAGNOSIS — E118 Type 2 diabetes mellitus with unspecified complications: Secondary | ICD-10-CM

## 2023-11-18 DIAGNOSIS — Z7985 Long-term (current) use of injectable non-insulin antidiabetic drugs: Secondary | ICD-10-CM | POA: Diagnosis not present

## 2023-11-18 DIAGNOSIS — E669 Obesity, unspecified: Secondary | ICD-10-CM

## 2023-11-18 DIAGNOSIS — Z6829 Body mass index (BMI) 29.0-29.9, adult: Secondary | ICD-10-CM

## 2023-11-18 NOTE — Progress Notes (Signed)
 WEIGHT SUMMARY AND BIOMETRICS  Weight Lost Since Last Visit: 2 lb  Weight Gained Since Last Visit: 0   Vitals Temp: 97.7 F (36.5 C) BP: 134/64 Pulse Rate: 70 SpO2: 99 %   Anthropometric Measurements Height: 5\' 2"  (1.575 m) Weight: 159 lb (72.1 kg) BMI (Calculated): 29.07 Weight at Last Visit: 161 lb Weight Lost Since Last Visit: 2 lb Weight Gained Since Last Visit: 0 Starting Weight: 197 lb Total Weight Loss (lbs): 38 lb (17.2 kg) Peak Weight: 230 lb   Body Composition  Body Fat %: 41.8 % Fat Mass (lbs): 66.4 lbs Muscle Mass (lbs): 87.8 lbs Total Body Water (lbs): 61 lbs Visceral Fat Rating : 12   Other Clinical Data Fasting: no Labs: no Today's Visit #: 21 Starting Date: 03/22/22    OBESITY Pualani is here to discuss her progress with her obesity treatment plan along with follow-up of her obesity related diagnoses.    Nutrition Plan: the Category 1 plan - 90% adherence.  Current exercise: aerobics and weightlifting  Interim History:  She is down 2 lbs since her last visit.  Not eating all of the food on the plan., Protein intake is as prescribed, Water intake is adequate., and Denies polyphagia   Pharmacotherapy: Veanna is on Mounjaro 12.5 mg SQ weekly Adverse side effects: None Hunger is moderately controlled.  Cravings are moderately controlled.  Assessment/Plan:   Type II Diabetes HgbA1c is at goal. Last A1c was 5.5 Episodes of hypoglycemia: no Medication(s): Mounjaro 12.5 mg SQ weekly  Lab Results  Component Value Date   HGBA1C 5.5 04/19/2023   HGBA1C 5.4 09/21/2022   HGBA1C 6.0 (H) 03/22/2022   Lab Results  Component Value Date   MICROALBUR <0.7 04/19/2023   LDLCALC 69 04/19/2023   CREATININE 0.70 04/19/2023   Lab Results  Component Value Date   GFR 84.42 04/19/2023   GFR 87.12 05/11/2021   GFR 84.48 10/05/2020     Plan: Continue and refill Mounjaro 12.5 mg SQ weekly Continue all other medications.  Will keep all carbohydrates low both sweets and starches.  Will continue exercise regimen to 30 to 60 minutes on most days of the week.  Will continue to minimize snacking.  Will increase her fiber. Fiber sheet given.  Eat more low glycemic index foods.     Generalized Obesity: Current BMI BMI (Calculated): 29.07   Pharmacotherapy Plan Continue Mounjaro 12.5 mg SQ weekly  Takako is currently in the action stage of change. As such, her goal is to continue with weight loss efforts.  She has agreed to the Category 1 plan.  Exercise goals: Older adults should follow the adult guidelines. When older adults cannot meet the adult guidelines, they should be as physically active as their abilities and conditions will allow.  She is walking and doing more weight training ( weights and exercise ball).   Behavioral  modification strategies: increasing lean protein intake, decreasing simple carbohydrates , meal planning , increase water intake, better snacking choices, planning for success, increasing vegetables, decrease snacking , avoiding temptations, keep healthy foods in the home, weigh protein portions, and mindful eating.  Gracey has agreed to follow-up with our clinic in 4 weeks.      Objective:   VITALS: Per patient if applicable, see vitals. GENERAL: Alert and in no acute distress. CARDIOPULMONARY: No increased WOB. Speaking in clear sentences.  PSYCH: Pleasant and cooperative. Speech normal rate and rhythm. Affect is appropriate. Insight and judgement are appropriate. Attention is focused, linear, and appropriate.  NEURO: Oriented as arrived to appointment on time with no prompting.   Attestation Statements:   This was prepared with the assistance of Engineer, civil (consulting).  Occasional wrong-word or sound-a-like substitutions may have occurred due to the inherent limitations of voice  recognition   Corinna Capra, DO

## 2023-12-02 DIAGNOSIS — H401132 Primary open-angle glaucoma, bilateral, moderate stage: Secondary | ICD-10-CM | POA: Diagnosis not present

## 2023-12-11 ENCOUNTER — Ambulatory Visit (INDEPENDENT_AMBULATORY_CARE_PROVIDER_SITE_OTHER): Admitting: Family Medicine

## 2023-12-11 ENCOUNTER — Encounter: Payer: Self-pay | Admitting: Family Medicine

## 2023-12-11 VITALS — BP 132/84 | HR 85 | Temp 98.5°F | Ht 62.0 in | Wt 159.6 lb

## 2023-12-11 DIAGNOSIS — K219 Gastro-esophageal reflux disease without esophagitis: Secondary | ICD-10-CM | POA: Diagnosis not present

## 2023-12-11 DIAGNOSIS — Z6829 Body mass index (BMI) 29.0-29.9, adult: Secondary | ICD-10-CM | POA: Diagnosis not present

## 2023-12-11 DIAGNOSIS — Z7985 Long-term (current) use of injectable non-insulin antidiabetic drugs: Secondary | ICD-10-CM | POA: Diagnosis not present

## 2023-12-11 DIAGNOSIS — J302 Other seasonal allergic rhinitis: Secondary | ICD-10-CM

## 2023-12-11 DIAGNOSIS — E782 Mixed hyperlipidemia: Secondary | ICD-10-CM

## 2023-12-11 DIAGNOSIS — E118 Type 2 diabetes mellitus with unspecified complications: Secondary | ICD-10-CM | POA: Diagnosis not present

## 2023-12-11 DIAGNOSIS — I10 Essential (primary) hypertension: Secondary | ICD-10-CM | POA: Diagnosis not present

## 2023-12-11 DIAGNOSIS — J45909 Unspecified asthma, uncomplicated: Secondary | ICD-10-CM

## 2023-12-11 DIAGNOSIS — F419 Anxiety disorder, unspecified: Secondary | ICD-10-CM

## 2023-12-11 DIAGNOSIS — Z7984 Long term (current) use of oral hypoglycemic drugs: Secondary | ICD-10-CM

## 2023-12-11 LAB — POCT GLYCOSYLATED HEMOGLOBIN (HGB A1C): Hemoglobin A1C: 4.9 % (ref 4.0–5.6)

## 2023-12-11 MED ORDER — OMEPRAZOLE 40 MG PO CPDR: 40.0000 mg | DELAYED_RELEASE_CAPSULE | Freq: Two times a day (BID) | ORAL | 3 refills | Status: AC

## 2023-12-11 NOTE — Progress Notes (Signed)
 Established Patient Office Visit   Subjective  Patient ID: Suzanne Conner, female    DOB: 03/12/1946  Age: 78 y.o. MRN: [id number]  Chief Complaint  Patient presents with   Follow-up    Patient is a 78 year old female seen for follow-up.  Patient states she has been doing well overall.  Working on taking each day one at a time.  Patient helps take care of her husband who has health problems.  Patient notes improvement in anxiety.  Exercising several times per week.  Followed by weight management.  Patient notes finally losing weight consistently.  Patient stopped drinking alcohol.  Asthma improving.  Using Incruse Ellipta inhaler and albuterol as needed.  Notes increased asthma symptoms with being outside.  At times wears a mask to help.  Does not recall having seasonal allergies.  Blood sugar at home has been good, was 92 days ago.  Patient feels like Micardis 40 mg causes an occasional headache 1 hour after taking.  Taking medication at night.  Patient also thinks medication may be causing nocturia.  Patient may get up 2 times per night to urinate.  Unable to fall back to sleep immediately.  Patient also notes drinking 60 to 70 ounces of water per day, having a diet Coke at dinner, and drinking 8 ounce cup of coffee in the morning.  Typically drinks throughout the evening until right before bed.    Patient Active Problem List   Diagnosis Date Noted   BMI 32.0-32.9,adult 10/18/2022   Moderate persistent asthma without complication 08/06/2022   Eating disorder 07/19/2022   Other constipation 06/28/2022   Polyphagia 06/07/2022   Class 2 severe obesity with serious comorbidity and body mass index (BMI) of 36.0 to 36.9 in adult [facility]) 06/07/2022   Shortness of breath 05/18/2022   Coronary artery disease involving native coronary artery of native heart without angina pectoris 05/17/2022   Type 2 diabetes mellitus with obesity (HCC) 05/07/2022   OSA (obstructive sleep apnea) 05/04/2022    Paroxysmal SVT (supraventricular tachycardia) (HCC) 03/23/2022   Excessive daytime sleepiness 02/22/2022   Other fatigue 02/22/2022   Generalized obesity 11/15/2021   Hyperlipidemia 11/15/2021   Severe persistent asthma 03/21/2021   Pulmonary sarcoidosis (HCC) 03/21/2021   Seborrheic dermatitis 08/11/2020   Essential hypertension 03/26/2020   Strabismus 03/26/2020   Depression, major, single episode, mild (HCC) 03/26/2020   Chronic pain of multiple joints 03/26/2020   Anxiety 03/26/2020   S/p reverse total shoulder arthroplasty 06/12/2018   Gastroesophageal reflux disease 05/02/2018   Tear of left rotator cuff 05/02/2018   Controlled type 2 diabetes mellitus with complication, without long-term current use of insulin (HCC) 05/02/2018   S/P right rotator cuff repair 05/02/2018   Past Medical History:  Diagnosis Date   Arthritis    "left hip; right wrist" (06/12/2018)   Asthma    Depression    Family history of polyps in the colon    GERD (gastroesophageal reflux disease)    Glaucoma    Hypertension    Paroxysmal SVT (supraventricular tachycardia) (HCC) 03/23/2022   2 Supraventricular Tachycardia runs occurred, the run with the fastest interval lasting 19 beats with a max rate of 218 bpm    Type 2 diabetes, diet controlled (HCC)    Past Surgical History:  Procedure Laterality Date   BUNIONECTOMY Right    CATARACT EXTRACTION W/ INTRAOCULAR LENS IMPLANT Right    JOINT REPLACEMENT     REPLACEMENT TOTAL KNEE Right 2015   REVERSE SHOULDER ARTHROPLASTY Left  06/12/2018   REVERSE SHOULDER ARTHROPLASTY Left 06/12/2018   Procedure: LEFT REVERSE SHOULDER ARTHROPLASTY;  Surgeon: Francena Hanly, MD;  Location: [facility] OR;  Service: Orthopedics;  Laterality: Left;   SHOULDER ARTHROSCOPY W/ ROTATOR CUFF REPAIR Right 2015   TUBAL LIGATION     Social History   Tobacco Use   Smoking status: Never   Smokeless tobacco: Never  Vaping Use   Vaping status: Never Used  Substance Use Topics    Alcohol use: Yes    Alcohol/week: 5.0 standard drinks of alcohol    Types: 5 Glasses of wine per week   Drug use: Never    Comment: 06/12/2018 CBD oil for pain   Family History  Problem Relation Age of Onset   Asthma Brother    Diabetes Brother    Dementia Mother    Cancer Father    Heart disease Sister    Diabetes Brother    Diabetes Brother    Healthy Son    Healthy Daughter    Allergies  Allergen Reactions   Paxil [Paroxetine]     Paxil 20 mg caused a tremor.   Penicillins Hives and Other (See Comments)    PATIENT HAS HAD A PCN REACTION WITH IMMEDIATE RASH, FACIAL/TONGUE/THROAT SWELLING, SOB, OR LIGHTHEADEDNESS WITH HYPOTENSION:  #  #  YES  #  #  INCLUDING HIVES AS A CHILD Has patient had a PCN reaction causing severe rash involving mucus membranes or skin necrosis: No Has patient had a PCN reaction that required hospitalization: No Has patient had a PCN reaction occurring within the last 10 years: No If all of the above answers are "NO", then may proceed with Cephalosporin use. PATIENT HAS HAD A PCN REACTION WITH IMMEDIATE RASH, FACIAL/TONGUE/THROAT SWELLING, SOB, OR LIGHTHEADEDNESS WITH HYPOTENSION:  #  #  YES  #  #  INCLUDING HIVES AS A CHILD Has patient had a PCN reaction causing severe rash involving mucus membranes or skin necrosis: No Has patient had a PCN reaction that required hospitalization: No Has patient had a PCN reaction occurring within the last 10 years: No If all of the above answers are "NO", then may proceed with Cephalosporin use.    Atorvastatin     Joint pain on 40mg    Simvastatin     Joint pain on 20mg  daily      ROS Negative unless stated above    Objective:     BP 132/84 (BP Location: Left Arm, Patient Position: Sitting, Cuff Size: Normal)   Pulse 85   Temp 98.5 F (36.9 C) (Oral)   Ht 5\' 2"  (1.575 m)   Wt 159 lb 9.6 oz (72.4 kg)   SpO2 97%   BMI 29.19 kg/m  BP Readings from Last 3 Encounters:  12/11/23 132/84  11/18/23 134/64   11/05/23 (!) 165/91   Wt Readings from Last 3 Encounters:  12/11/23 159 lb 9.6 oz (72.4 kg)  11/18/23 159 lb (72.1 kg)  10/21/23 161 lb (73 kg)    Physical Exam Constitutional:      General: She is not in acute distress.    Appearance: Normal appearance.  HENT:     Head: Normocephalic and atraumatic.     Nose: Nose normal.     Mouth/Throat:     Mouth: Mucous membranes are moist.  Cardiovascular:     Rate and Rhythm: Normal rate and regular rhythm.     Heart sounds: Normal heart sounds. No murmur heard.    No gallop.  Pulmonary:  Effort: Pulmonary effort is normal. No respiratory distress.     Breath sounds: Normal breath sounds. No wheezing, rhonchi or rales.  Skin:    General: Skin is warm and dry.  Neurological:     Mental Status: She is alert and oriented to person, place, and time.       12/11/2023   11:18 AM 04/19/2023    1:13 PM 09/21/2022    8:42 AM  Depression screen PHQ 2/9  Decreased Interest 0 0 0  Down, Depressed, Hopeless 0 0 0  PHQ - 2 Score 0 0 0  Altered sleeping 0 0 1  Tired, decreased energy 1 1 0  Change in appetite 0 0 1  Feeling bad or failure about yourself  0 0 0  Trouble concentrating 0 0 0  Moving slowly or fidgety/restless 0 0 0  Suicidal thoughts 0 0 0  PHQ-9 Score 1 1 2   Difficult doing work/chores Somewhat difficult Somewhat difficult Not difficult at all      12/11/2023   11:19 AM 04/19/2023    1:14 PM 07/19/2021    5:17 PM 06/27/2020   11:47 AM  GAD 7 : Generalized Anxiety Score  Nervous, Anxious, on Edge 1 1 1 1   Control/stop worrying 0 0 1 0  Worry too much - different things 1 1 1 1   Trouble relaxing 0 0 1 0  Restless 0 0 0 0  Easily annoyed or irritable 0 0 0 0  Afraid - awful might happen 0 0 1 0  Total GAD 7 Score 2 2 5 2   Anxiety Difficulty Somewhat difficult Somewhat difficult Somewhat difficult Not difficult at all    Results for orders placed or performed in visit on 12/11/23  POC HgB A1c  Result Value Ref Range    Hemoglobin A1C 4.9 4.0 - 5.6 %   HbA1c POC (<> result, manual entry)     HbA1c, POC (prediabetic range)     HbA1c, POC (controlled diabetic range)        Assessment & Plan:  Controlled type 2 diabetes mellitus with complication, without long-term current use of insulin (HCC) -     POCT glycosylated hemoglobin (Hb A1C)  Essential hypertension  Mixed hyperlipidemia  Anxiety  Asthma, unspecified asthma severity, unspecified whether complicated, unspecified whether persistent  BMI 29.0-29.9,adult  Gastroesophageal reflux disease -     Omeprazole; Take 1 capsule (40 mg total) by mouth 2 (two) times daily.  Dispense: 180 capsule; Refill: 3  Seasonal allergies   DM well-controlled.  A1c was 5.5% on 04/19/2023.  A1c this visit 4.9%.  Continue Mounjaro 12.5 mg weekly, Jardiance 10 mg daily, and lifestyle modifications.  Gradual weight loss.  BMI now 29.19 kg/m, weight 159.6 lbs.  BP controlled.  Continue Bystolic 5 mg nightly and telmisartan 40 mg daily.  Continue self-care.  Continue albuterol and Incruse inhaler.  Patient advised to try OTC antihistamine such as Allegra at change of season, with increased pollen, or extended time outdoors.  Continue follow-up with weight management.  Return in about 4 months (around 04/18/2024) for Annual wellness visit.  AWV due on or after 04/18/2024.  Deeann Saint, MD

## 2023-12-16 ENCOUNTER — Encounter: Payer: Self-pay | Admitting: Bariatrics

## 2023-12-16 ENCOUNTER — Ambulatory Visit (INDEPENDENT_AMBULATORY_CARE_PROVIDER_SITE_OTHER): Admitting: Bariatrics

## 2023-12-16 VITALS — BP 137/74 | HR 84 | Temp 97.9°F | Ht 62.0 in | Wt 156.0 lb

## 2023-12-16 DIAGNOSIS — E119 Type 2 diabetes mellitus without complications: Secondary | ICD-10-CM | POA: Diagnosis not present

## 2023-12-16 DIAGNOSIS — I10 Essential (primary) hypertension: Secondary | ICD-10-CM

## 2023-12-16 DIAGNOSIS — Z7985 Long-term (current) use of injectable non-insulin antidiabetic drugs: Secondary | ICD-10-CM | POA: Diagnosis not present

## 2023-12-16 DIAGNOSIS — E669 Obesity, unspecified: Secondary | ICD-10-CM | POA: Diagnosis not present

## 2023-12-16 DIAGNOSIS — Z6828 Body mass index (BMI) 28.0-28.9, adult: Secondary | ICD-10-CM | POA: Diagnosis not present

## 2023-12-16 DIAGNOSIS — E118 Type 2 diabetes mellitus with unspecified complications: Secondary | ICD-10-CM

## 2023-12-16 NOTE — Progress Notes (Signed)
 WEIGHT SUMMARY AND BIOMETRICS  Weight Lost Since Last Visit: 3lb  Weight Gained Since Last Visit: 0   Vitals Temp: 97.9 F (36.6 C) BP: 137/74 Pulse Rate: 84 SpO2: 97 %   Anthropometric Measurements Height: 5\' 2"  (1.575 m) Weight: 156 lb (70.8 kg) BMI (Calculated): 28.53 Weight at Last Visit: 159lb Weight Lost Since Last Visit: 3lb Weight Gained Since Last Visit: 0 Starting Weight: 197lb Total Weight Loss (lbs): 41 lb (18.6 kg) Peak Weight: 230lb   Body Composition  Body Fat %: 41.4 % Fat Mass (lbs): 64 lbs Muscle Mass (lbs): 87.2 lbs Total Body Water (lbs): 62.8 lbs Visceral Fat Rating : 12   Other Clinical Data Fasting: no Labs: no Today's Visit #: 22 Starting Date: 03/22/22    OBESITY Suzanne Conner is here to discuss her progress with her obesity treatment plan along with follow-up of her obesity related diagnoses.    Nutrition Plan: the Category 1 plan - 85-90% adherence.  Current exercise:  [employer]/Walking  Interim History:  She is down 3 lbs since her last visit. She is increasing her walking.  Eating all of the food on the plan., Protein intake is as prescribed, Is not exceeding snack calorie allotment, and Water intake is adequate.   Pharmacotherapy: Suzanne Conner is on Mounjaro 12.5 mg SQ weekly Adverse side effects: None Hunger is moderately controlled.  Cravings are moderately controlled.  Assessment/Plan:   Type II Diabetes HgbA1c is at goal. Last A1c was 4.9 CBGs: Fasting 90 to 110       Episodes of hypoglycemia: no Medication(s): Mounjaro 12.5 mg SQ weekly  Lab Results  Component Value Date   HGBA1C 4.9 12/11/2023   HGBA1C 5.5 04/19/2023   HGBA1C 5.4 09/21/2022   Lab Results  Component Value Date   MICROALBUR <0.7 04/19/2023   LDLCALC 69 04/19/2023   CREATININE 0.70 04/19/2023   Lab Results  Component Value Date   GFR  84.42 04/19/2023   GFR 87.12 05/11/2021   GFR 84.48 10/05/2020    Plan: Continue and refill Mounjaro 12.5 mg SQ weekly Continue all other medications.  Will keep all carbohydrates low both sweets and starches.  Will continue exercise regimen to 30 to 60 minutes on most days of the week.  Aim for 7 to 9 hours of sleep nightly.  Eat more low glycemic index foods.  Will continue to make meals at home. Will increase her exercise and add in some weights.  Hypertension Hypertension well controlled.  Medication(s): Bystolic 5 mg and Micardis 40 mg  BP Readings from Last 3 Encounters:  12/16/23 137/74  12/11/23 132/84  11/18/23 134/64   Lab Results  Component Value Date   CREATININE 0.70 04/19/2023   CREATININE 0.69 02/20/2022   CREATININE 0.77 10/31/2021   Lab Results  Component Value Date   GFR 84.42 04/19/2023   GFR 87.12 05/11/2021   GFR  84.48 10/05/2020    Plan: Continue all antihypertensives at current dosages. No added salt. Will keep sodium content to 1,500 mg or less per day.   [facility], and adding in some resistance training.    Generalized Obesity: Current BMI BMI (Calculated): 28.53   Pharmacotherapy Plan Continue  Mounjaro 12.5 mg SQ weekly  Suzanne Conner is currently in the action stage of change. As such, her goal is to continue with weight loss efforts.  She has agreed to the Category 1 plan.  Exercise goals: Older adults should determine their level of effort for physical activity relative to their level of fitness.  She is going to the gym and doing some weight training,  Behavioral modification strategies: increasing lean protein intake, no meal skipping, meal planning , increase water intake, better snacking choices, planning for success, increasing vegetables, increasing fiber rich foods, avoiding temptations, and mindful eating.  Suzanne Conner has agreed to follow-up with our clinic in 4 weeks.      Objective:   VITALS: Per  patient if applicable, see vitals. GENERAL: Alert and in no acute distress. CARDIOPULMONARY: No increased WOB. Speaking in clear sentences.  PSYCH: Pleasant and cooperative. Speech normal rate and rhythm. Affect is appropriate. Insight and judgement are appropriate. Attention is focused, linear, and appropriate.  NEURO: Oriented as arrived to appointment on time with no prompting.   Attestation Statements:   This was prepared with the assistance of Engineer, civil (consulting).  Occasional wrong-word or sound-a-like substitutions may have occurred due to the inherent limitations of voice recognition   Corinna Capra, DO

## 2023-12-31 ENCOUNTER — Ambulatory Visit (HOSPITAL_BASED_OUTPATIENT_CLINIC_OR_DEPARTMENT_OTHER): Payer: Medicare Other | Admitting: Pulmonary Disease

## 2023-12-31 ENCOUNTER — Encounter (HOSPITAL_BASED_OUTPATIENT_CLINIC_OR_DEPARTMENT_OTHER): Payer: Self-pay | Admitting: Pulmonary Disease

## 2023-12-31 VITALS — BP 122/68 | HR 86 | Ht 62.0 in | Wt 160.0 lb

## 2023-12-31 DIAGNOSIS — D860 Sarcoidosis of lung: Secondary | ICD-10-CM

## 2023-12-31 DIAGNOSIS — J4550 Severe persistent asthma, uncomplicated: Secondary | ICD-10-CM

## 2023-12-31 DIAGNOSIS — G4733 Obstructive sleep apnea (adult) (pediatric): Secondary | ICD-10-CM | POA: Diagnosis not present

## 2023-12-31 NOTE — Progress Notes (Signed)
 Subjective:   PATIENT ID: Suzanne Conner, Suzanne Conner   HPI  Chief Complaint  Patient presents with   Follow-up    Obstructive sleep apnea, Asthma    Reason for Visit: Follow-up  Suzanne Conner is a 78 year old female never smoker with childhood asthma, diabetes (diet controlled), hypertension and GERD who presents for follow-up  Synopsis:  She was previously seen by a Pulmonary doctor in [place]  22 years ago for sarcoid diagnosed via bronchoscopy. She is unable to recall any prolonged treatment. Has not had any respiratory issues since then. Was only seen for ~1 year and reports CXR improved during that time which is suggestive Lofgren syndrome. Denies skin involvement except for seborrheic keratosis. Sees an ophthalmologist for high optic pressures.   On initial consultation had shortness of breath and wheezing x 2 months. She lives in a two story home and now having difficulty walking up.   2022 - Improved symptoms on medium dose Advair  however still not much stamina since the pandemic. Completed Pulm Rehab 2023 - Persistent shortness of breath. Reported frequent nighttime awakenings and am headaches. Dx with mild OSA and started on CPAP. Improved asthma symptoms on Wixela 500  11/02/22 Since our last visit she is on high dose Advair . She is using albuterol  1-2 times month. She continues to have shortness of breath with moderate exertion. Denies wheezing or coughing. Compliant with CPAP nightly, unsure of improved dypsnea but does have improved quality of sleep. No longer have nocturnal awakenings. Has used mouth tape while on her nasal prongs.  08/12/23 Since our last visit she had covid in May and treated with paxlovid . She uses her Wixela twice a day a majority of the week with 2-3 days once daily. Denies cough, sputum production, wheezing. Does have some shortness of breath. Has not been able to use her CPAP due to her recent  hospitalizations and has let it lapse. Previously had good quality sleep when she was on the machine.  12/31/23 Since our last visit she has stopped using CPAP. She is working on losing weight (40 lbs in the last year and 10 lbs in the last 3 months) and resting better. She is compliant Wixela and Incruse however only taking Wixela once a day. She is having shortness of breath with exertion/exercise. Able to ambulate in store and within home. No limitations. No asthma exacerbations. Denies nocturnal symptoms.   Asthma Control Test ACT Total Score  08/12/2023  2:41 PM 18  02/22/2022 10:40 AM 17  01/04/2021  2:14 PM 16   Review of Systems  Constitutional:  Negative for chills, diaphoresis, fever, malaise/fatigue and weight loss.  HENT:  Negative for congestion.   Respiratory:  Positive for shortness of breath. Negative for cough, hemoptysis, sputum production and wheezing.   Cardiovascular:  Negative for chest pain, palpitations and leg swelling.   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 Husbands health worsening No pets  Past Medical History:  Diagnosis Date   Arthritis    "left hip; right wrist" (06/12/2018)   Asthma    Depression    Family history of polyps in the colon    GERD (gastroesophageal reflux disease)    Glaucoma    Hypertension    Paroxysmal SVT (supraventricular tachycardia) (HCC) 03/23/2022   2 Supraventricular Tachycardia runs occurred, the run with the fastest interval lasting 19 beats with a max rate of 218 bpm    Type 2 diabetes, diet controlled (HCC)  Allergies  Allergen Reactions   Paxil  [Paroxetine ]     Paxil  20 mg caused a tremor.   Penicillins Hives and Other (See Comments)    PATIENT HAS HAD A PCN REACTION WITH IMMEDIATE RASH, FACIAL/TONGUE/THROAT SWELLING, SOB, OR LIGHTHEADEDNESS WITH HYPOTENSION:  #  #  YES  #  #  INCLUDING HIVES AS A CHILD Has patient had a PCN reaction causing severe rash involving mucus membranes or skin necrosis: No Has  patient had a PCN reaction that required hospitalization: No Has patient had a PCN reaction occurring within the last 10 years: No If all of the above answers are "NO", then may proceed with Cephalosporin use. PATIENT HAS HAD A PCN REACTION WITH IMMEDIATE RASH, FACIAL/TONGUE/THROAT SWELLING, SOB, OR LIGHTHEADEDNESS WITH HYPOTENSION:  #  #  YES  #  #  INCLUDING HIVES AS A CHILD Has patient had a PCN reaction causing severe rash involving mucus membranes or skin necrosis: No Has patient had a PCN reaction that required hospitalization: No Has patient had a PCN reaction occurring within the last 10 years: No If all of the above answers are "NO", then may proceed with Cephalosporin use.    Atorvastatin      Joint pain on 40mg    Simvastatin      Joint pain on 20mg  daily     Outpatient Medications Prior to Visit  Medication Sig Dispense Refill   albuterol  (VENTOLIN  HFA) 108 (90 Base) MCG/ACT inhaler Inhale 2 puffs into the lungs every 6 (six) hours as needed for wheezing or shortness of breath. 8 g 2   aspirin  EC 81 MG tablet Take 81 mg by mouth daily.     Cholecalciferol (VITAMIN D ) 2000 units CAPS Take 2,000 Units by mouth daily.     clindamycin (CLEOCIN) 300 MG capsule Take 300 mg by mouth 2 (two) times daily. Prior to dental appointments     Clobetasol  Propionate 0.05 % shampoo Use a small amount on the affected areas twice a day as needed. 118 mL 0   empagliflozin  (JARDIANCE ) 10 MG TABS tablet Take 1 tablet (10 mg total) by mouth daily before breakfast. 90 tablet 3   fluticasone -salmeterol (WIXELA INHUB) 500-50 MCG/ACT AEPB USE 1 INHALATION IN THE MORNING AND AT BEDTIME 180 each 3   hydrocortisone  valerate cream (WESTCORT ) 0.2 % Apply 1 application topically 2 (two) times daily. 45 g 0   latanoprost (XALATAN) 0.005 % ophthalmic solution Place 1 drop into both eyes at bedtime.     Melatonin 10 MG TABS Take 1 tablet by mouth at bedtime as needed.     Multiple Vitamins-Minerals (CENTRUM SILVER  PO) Take 1 tablet by mouth daily.      nebivolol  (BYSTOLIC ) 5 MG tablet Take 1 tablet (5 mg total) by mouth at bedtime. 90 tablet 3   omeprazole  (PRILOSEC) 40 MG capsule Take 1 capsule (40 mg total) by mouth 2 (two) times daily. 180 capsule 3   ondansetron  (ZOFRAN ) 4 MG tablet Take 1 tablet (4 mg total) by mouth every 8 (eight) hours as needed for nausea or vomiting. 30 tablet 0   OPTIVE 0.5-0.9 % ophthalmic solution Place 1 drop into both eyes 2 (two) times daily.     Probiotic Product (TRUNATURE DIGESTIVE PROBIOTIC) CAPS Take 1 capsule by mouth daily.      RESTASIS 0.05 % ophthalmic emulsion Place 1 drop into both eyes in the morning and at bedtime.     rosuvastatin  (CRESTOR ) 5 MG tablet TAKE 1 TABLET DAILY 90 tablet 3  telmisartan  (MICARDIS ) 40 MG tablet TAKE 1 TABLET DAILY 90 tablet 3   tirzepatide  (MOUNJARO ) 12.5 MG/0.5ML Pen Inject 12.5 mg into the skin once a week. 6 mL 0   umeclidinium bromide  (INCRUSE ELLIPTA ) 62.5 MCG/ACT AEPB Inhale 1 puff into the lungs daily. 30 each 2   umeclidinium bromide  (INCRUSE ELLIPTA ) 62.5 MCG/ACT AEPB Inhale 1 puff into the lungs daily. 90 each 3   No facility-administered medications prior to visit.    Objective:   Vitals:   12/31/23 1050  BP: 122/68  Pulse: 86  SpO2: 100%  Weight: 160 lb (72.6 kg)  Height: 5\' 2"  (1.575 m)   SpO2: 100 %  Physical Exam: General: Well-appearing, no acute distress HENT: [place], AT Eyes: EOMI, no scleral icterus Respiratory: Clear to auscultation bilaterally.  No crackles, wheezing or rales Cardiovascular: RRR, -M/R/G, no JVD Extremities:-Edema,-tenderness Neuro: AAO x4, CNII-XII grossly intact Psych: Normal mood, normal affect  Data Reviewed:  Imaging: CXR 07/12/2020-no infiltrate, effusion or edema. CT Coronary 03/20/22 - Visualized lung parenchyma with no pulmonary nodules, masses, infiltrate, effusion or pneumothorax. Large hiatal hernia  PFT: 01/04/21 FVC 1.49 (62%) FEV1 1.10 (59%) Ratio 73  TLC 63%  DLCO 62% Interpretation: Mixed obstructive and restrictive defect with mildly reduced DLCO. No significant bronchodilator response however does not preclude benefit of bronchodilators  05/04/22 FVC 1.36(45%) FEV1 1.06(45%) Ratio 74  TLC 63% DLCO 58% Interpretation: Mixed obstructive and restrictive defect with mildly reduced DLCO. Significant FEV1 bronchodilator response. Compared to 2022, reduced FEV1.  08/12/23 FVC 1.68 (66%) FEV1 1.28 (67%) Ratio 71  TLC 71% DLCO 75% Interpretation: Moderate restrictive defect with mildly reduced DLCO. Partial bronchodilator response.   --Improved obstruction and restriction and DLCO   Labs: CBC    Component Value Date/Time   WBC 6.7 04/19/2023 1134   RBC 4.78 04/19/2023 1134   HGB 13.3 04/19/2023 1134   HGB 12.7 11/12/2022 1444   HCT 41.6 04/19/2023 1134   HCT 39.3 11/12/2022 1444   PLT 311.0 04/19/2023 1134   MCV 87.0 04/19/2023 1134   MCH 27.7 03/18/2020 1135   MCHC 32.0 04/19/2023 1134   RDW 14.9 04/19/2023 1134   LYMPHSABS 2.1 04/19/2023 1134   MONOABS 0.5 04/19/2023 1134   EOSABS 0.0 04/19/2023 1134   BASOSABS 0.0 04/19/2023 1134   Absolute eos  03/18/20-70 05/11/21 - 100  Sleep: HST 04/19/22 - Mild OSA. AHI 6.7. Nadir SpO2 77%. Mean SpO2 92%  CPAP compliance 10/03/22 - 11/01/22 >4 hours 30/30 days <4 hours 29 days (97%)    Assessment & Plan:   Discussion: 78 year old female never smoker with severe asthma, pulmonary sarcoid/Lofgren's, OSA, DM2 who presents for follow-up. Well controlled on current bronchodilators. Improved dyspnea since losing weight. Compliant with bronchodilators but not taking Wixela twice a day. Counseled on taking meds as directed.   Mild OSA - nonadherent --Encourage restarting to help with shortness of breath if weight gain --Counseled NOT to drive if/when sleepy --Has lost 40 lbs in 1 year. Consider re-evaluation with home sleep study in the future  Severe persistent asthma - stable - CONTINUE Incruse  ONE puff ONCE a day - CONTINUE Wixela Diskus 500-50 ONE puff TWICE a day. REFILL - CONTINUE Albuterol  TWO puffs as needed for shortness of breath or wheezing   Asthma action plan Use albuterol  for worsening shortness of breath, wheezing and cough. If you symptoms do not improve in 24-48 hours, please our office for evaluation and/or prednisone  taper.   Pulmonary sarcoidosis - Dx in  1999 via endobronchial bx. No records available - Hx of resolution in <1 year suggestive of Lofgren - No indication for prednisone  therapy for now - Annual PFTs. Due 08/2024. Schedule for next visit. - Recommend annual ophthalmology exam. [facility] 02/22/22 - no sarcoid invovlement - Reviewed prior EKG. Prolonged P-R interval.  - Plan for repeat PFTs in 6-12 months pending clinical improvement (08/2024 at earliest)   Health Maintenance Immunization History  Administered Date(s) Administered   Fluad Quad(high Dose 65+) 06/22/2020, 07/06/2022   Influenza, High Dose Seasonal PF 05/12/2019, 06/10/2021   Influenza-Unspecified 05/29/2018, 05/12/2019   Moderna Covid-19 Fall Seasonal Vaccine 11yrs & older 05/29/2022   Moderna Covid-19 Vaccine Bivalent Booster 50yrs & up 05/13/2021   PFIZER Comirnaty(Gray Top)Covid-19 Tri-Sucrose Vaccine 12/08/2020   PFIZER(Purple Top)SARS-COV-2 Vaccination 10/01/2019, 10/22/2019, 06/10/2020, 06/17/2020, 12/08/2020   PNEUMOCOCCAL CONJUGATE-20 07/16/2022   Pfizer Covid-19 Vaccine Bivalent Booster 43yrs & up 07/06/2022   Pfizer(Comirnaty)Fall Seasonal Vaccine 12 years and older 06/01/2022   Pneumococcal Polysaccharide-23 06/13/2018   Pneumococcal-Unspecified 07/06/2022   RSV,unspecified 07/06/2022   Zoster Recombinant(Shingrix) 03/10/2019, 05/12/2019   Zoster, Live 04/27/2008   CT Lung Screen - not indicated  Orders Placed This Encounter  Procedures   Pulmonary function test    Standing Status:   Future    Expiration Date:   12/30/2024    Where should this test be  performed?:   Outpatient Pulmonary    What type of PFT is being ordered?:   Full PFT   No orders of the defined types were placed in this encounter.   Return in about 7 months (around 08/01/2024) for after PFT.   I have spent a total time of 30-minutes on the day of the appointment including chart review, data review, collecting history, coordinating care and discussing medical diagnosis and plan with the patient/family. Past medical history, allergies, medications were reviewed. Pertinent imaging, labs and tests included in this note have been reviewed and interpreted independently by me.  Everette Dimauro Genetta Kenning, MD [facility] 12/31/2023

## 2023-12-31 NOTE — Patient Instructions (Addendum)
 Mild OSA - nonadherent but asymptomatic --Encourage restarting to help with shortness of breath if weight gain --Counseled NOT to drive if/when sleepy --Has lost 40 lbs in 1 year. Consider re-evaluation with home sleep study in the future  Severe persistent asthma - stable - CONTINUE Incruse ONE puff ONCE a day - CONTINUE Wixela Diskus 500-50 ONE puff TWICE a day. REFILL - CONTINUE Albuterol  TWO puffs as needed for shortness of breath or wheezing   Asthma action plan Use albuterol  for worsening shortness of breath, wheezing and cough. If you symptoms do not improve in 24-48 hours, please our office for evaluation and/or prednisone  taper.

## 2024-01-14 ENCOUNTER — Encounter: Payer: Self-pay | Admitting: Bariatrics

## 2024-01-14 ENCOUNTER — Ambulatory Visit (INDEPENDENT_AMBULATORY_CARE_PROVIDER_SITE_OTHER): Admitting: Bariatrics

## 2024-01-14 VITALS — BP 139/77 | HR 78 | Temp 97.7°F | Ht 62.0 in | Wt 154.0 lb

## 2024-01-14 DIAGNOSIS — E118 Type 2 diabetes mellitus with unspecified complications: Secondary | ICD-10-CM | POA: Diagnosis not present

## 2024-01-14 DIAGNOSIS — E785 Hyperlipidemia, unspecified: Secondary | ICD-10-CM | POA: Diagnosis not present

## 2024-01-14 DIAGNOSIS — E669 Obesity, unspecified: Secondary | ICD-10-CM

## 2024-01-14 DIAGNOSIS — Z7985 Long-term (current) use of injectable non-insulin antidiabetic drugs: Secondary | ICD-10-CM | POA: Diagnosis not present

## 2024-01-14 DIAGNOSIS — E782 Mixed hyperlipidemia: Secondary | ICD-10-CM

## 2024-01-14 DIAGNOSIS — Z6828 Body mass index (BMI) 28.0-28.9, adult: Secondary | ICD-10-CM

## 2024-01-14 MED ORDER — TIRZEPATIDE 12.5 MG/0.5ML ~~LOC~~ SOAJ: 12.5000 mg | SUBCUTANEOUS | 0 refills | Status: DC

## 2024-01-14 MED ORDER — ONDANSETRON 4 MG PO TBDP: 4.0000 mg | ORAL_TABLET | Freq: Three times a day (TID) | ORAL | 0 refills | Status: DC | PRN

## 2024-01-14 NOTE — Progress Notes (Signed)
 WEIGHT SUMMARY AND BIOMETRICS  Weight Lost Since Last Visit: 2lb  Weight Gained Since Last Visit: 0   Vitals Temp: 97.7 F (36.5 C) BP: 139/77 Pulse Rate: 78 SpO2: 98 %   Anthropometric Measurements Height: 5\' 2"  (1.575 m) Weight: 154 lb (69.9 kg) BMI (Calculated): 28.16 Weight at Last Visit: 156lb Weight Lost Since Last Visit: 2lb Weight Gained Since Last Visit: 0 Starting Weight: 197lb Total Weight Loss (lbs): 43 lb (19.5 kg)   Body Composition  Body Fat %: 40 % Fat Mass (lbs): 61.6 lbs Muscle Mass (lbs): 87.6 lbs Total Body Water (lbs): 60.4 lbs Visceral Fat Rating : 11   Other Clinical Data Fasting: no Labs: no Today's Visit #: 23 Starting Date: 03/22/22    OBESITY Suzanne Conner is here to discuss her progress with her obesity treatment plan along with follow-up of her obesity related diagnoses.    Nutrition Plan: the Category 1 plan - 95% adherence.  Current exercise: walking and going to the [facility].  Interim History:  She is down 2 lbs. Her appetite  Eating all of the food on the plan., Protein intake is as prescribed, Is not skipping meals, and Water intake is adequate.   Pharmacotherapy: Suzanne Conner is on Mounjaro  12.5 mg SQ weekly Adverse side effects: None Hunger is moderately controlled.  Cravings are moderately controlled.  Assessment/Plan:   Type II Diabetes HgbA1c is at goal. Last A1c was 4.9 Episodes of hypoglycemia: no Medication(s): Mounjaro  12.5 mg SQ weekly  Lab Results  Component Value Date   HGBA1C 4.9 12/11/2023   HGBA1C 5.5 04/19/2023   HGBA1C 5.4 09/21/2022   Lab Results  Component Value Date   MICROALBUR <0.7 04/19/2023   LDLCALC 69 04/19/2023   CREATININE 0.70 04/19/2023   Lab Results  Component Value Date   GFR 84.42 04/19/2023   GFR 87.12 05/11/2021   GFR 84.48 10/05/2020    Plan: Continue and refill  Mounjaro  12.5 mg SQ weekly Continue all other medications.  Will keep all carbohydrates low both sweets and starches.  Will continue exercise regimen to 30 to 60 minutes on most days of the week.  Will add in some resistance training.  Aim for 7 to 9 hours of sleep nightly.  Eat more low glycemic index foods.    Hyperlipidemia LDL is at goal. Medication(s): Crestor   Cardiovascular risk factors: advanced age (older than 43 for men, 28 for women), diabetes mellitus, dyslipidemia, hypertension, obesity (BMI >= 30 kg/m2), and sedentary lifestyle  Lab Results  Component Value Date   CHOL 132 04/19/2023   HDL 45.50 04/19/2023   LDLCALC 69 04/19/2023   TRIG 85.0 04/19/2023   CHOLHDL 3 04/19/2023   Lab Results  Component Value Date   ALT 20 04/19/2023   AST 19 04/19/2023   ALKPHOS 58 04/19/2023   BILITOT 0.4 04/19/2023   The 10-year ASCVD risk score (  Arnett DK, et al., 2019) is: 25.4%   Values used to calculate the score:     Age: 78 years     Sex: Female     Is Non-Hispanic African American: Yes     Diabetic: Yes     Tobacco smoker: No     Systolic Blood Pressure: 139 mmHg     Is BP treated: Yes     HDL Cholesterol: 45.5 mg/dL     Total Cholesterol: 132 mg/dL  Plan:  Continue statin.  Information sheet on healthy vs unhealthy fats.  Will avoid all trans fats.  Will read labels Will minimize saturated fats except the following: low fat meats in moderation, diary, and limited dark chocolate.  Will adhere closely to the plan.     Generalized Obesity: Current BMI BMI (Calculated): 28.16   Pharmacotherapy Plan Continue and refill  Mounjaro  12.5 mg SQ weekly  Suzanne Conner is currently in the action stage of change. As such, her goal is to continue with weight loss efforts.  She has agreed to the Category 1 plan.  Exercise goals: Older adults should follow the adult guidelines. When older adults cannot meet the adult guidelines, they should be as physically active as their  abilities and conditions will allow.   Behavioral modification strategies: increasing lean protein intake, no meal skipping, meal planning , better snacking choices, planning for success, increasing vegetables, decrease snacking , and avoiding temptations.  Suzanne Conner has agreed to follow-up with our clinic in 4 weeks.    Objective:   VITALS: Per patient if applicable, see vitals. GENERAL: Alert and in no acute distress. CARDIOPULMONARY: No increased WOB. Speaking in clear sentences.  PSYCH: Pleasant and cooperative. Speech normal rate and rhythm. Affect is appropriate. Insight and judgement are appropriate. Attention is focused, linear, and appropriate.  NEURO: Oriented as arrived to appointment on time with no prompting.   Attestation Statements:   This was prepared with the assistance of Engineer, civil (consulting).  Occasional wrong-word or sound-a-like substitutions may have occurred due to the inherent limitations of voice recognition   Kirk Peper, DO

## 2024-01-24 ENCOUNTER — Other Ambulatory Visit: Payer: Self-pay | Admitting: Internal Medicine

## 2024-02-19 ENCOUNTER — Ambulatory Visit: Admitting: Bariatrics

## 2024-02-27 DIAGNOSIS — M79604 Pain in right leg: Secondary | ICD-10-CM | POA: Diagnosis not present

## 2024-02-27 DIAGNOSIS — M79661 Pain in right lower leg: Secondary | ICD-10-CM | POA: Diagnosis not present

## 2024-02-27 DIAGNOSIS — M79662 Pain in left lower leg: Secondary | ICD-10-CM | POA: Diagnosis not present

## 2024-02-27 DIAGNOSIS — I87392 Chronic venous hypertension (idiopathic) with other complications of left lower extremity: Secondary | ICD-10-CM | POA: Diagnosis not present

## 2024-02-27 DIAGNOSIS — I83892 Varicose veins of left lower extremities with other complications: Secondary | ICD-10-CM | POA: Diagnosis not present
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
## 2024-03-09 ENCOUNTER — Encounter: Payer: Self-pay | Admitting: Physician Assistant

## 2024-03-09 ENCOUNTER — Ambulatory Visit: Payer: Medicare Other | Attending: Physician Assistant | Admitting: Physician Assistant

## 2024-03-09 ENCOUNTER — Ambulatory Visit

## 2024-03-09 VITALS — BP 127/50 | HR 89 | Ht 64.0 in | Wt 154.4 lb

## 2024-03-09 DIAGNOSIS — D860 Sarcoidosis of lung: Secondary | ICD-10-CM | POA: Insufficient documentation

## 2024-03-09 DIAGNOSIS — I471 Supraventricular tachycardia, unspecified: Secondary | ICD-10-CM | POA: Diagnosis not present

## 2024-03-09 DIAGNOSIS — I10 Essential (primary) hypertension: Secondary | ICD-10-CM | POA: Insufficient documentation

## 2024-03-09 DIAGNOSIS — I2510 Atherosclerotic heart disease of native coronary artery without angina pectoris: Secondary | ICD-10-CM

## 2024-03-09 DIAGNOSIS — E7800 Pure hypercholesterolemia, unspecified: Secondary | ICD-10-CM | POA: Diagnosis not present

## 2024-03-09 DIAGNOSIS — R000 Tachycardia, unspecified: Secondary | ICD-10-CM

## 2024-03-09 NOTE — Assessment & Plan Note (Signed)
 Mild non-obstructive coronary artery disease by CCTA in July 2023. She has not had decreased exercise tolerance or exertional chest pain to suggest angina. - Continue aspirin  81 mg daily - Continue rosuvastatin  5 mg daily - Advise to follow up sooner if symptoms of angina or decreased exercise tolerance develop

## 2024-03-09 NOTE — Assessment & Plan Note (Signed)
 Recent CMR in January 2025 negative for cardiac sarcoid. Elevated heart rate symptoms may be related to pulmonary issues, stress/anxiety rather than cardiac. - Order 2D echocardiogram as noted to evaluate tachycardia - Follow up with pulmonology as planned

## 2024-03-09 NOTE — Assessment & Plan Note (Signed)
 Recent episodes of increased heart rate associated with activity, likely exacerbated by stress and anxiety related to husband's health condition. No episodes at rest, dizziness, or syncope. Previous monitor showed brief SVT episodes unrelated to activity. We discussed further testing to rule out cardiac causes.  - Order 2D echocardiogram to r/o structural heart disease - Order 14-day XT monitor to r/o arrhythmia - Order CBC, BMET and TSH to rule out metabolic causes - Follow up in 6 months or sooner if testing is abnormal or symptoms change

## 2024-03-09 NOTE — Assessment & Plan Note (Signed)
 LDL optimal. Continue Rosuvastatin  5 mg once daily.

## 2024-03-09 NOTE — Patient Instructions (Signed)
 Medication Instructions:  Your physician recommends that you continue on your current medications as directed. Please refer to the Current Medication list given to you today.  *If you need a refill on your cardiac medications before your next appointment, please call your pharmacy*  Lab Work: TODAY:  BMET, CBC, & TSH   If you have labs (blood work) drawn today and your tests are completely normal, you will receive your results only by: MyChart Message (if you have MyChart) OR A paper copy in the mail If you have any lab test that is abnormal or we need to change your treatment, we will call you to review the results.  Testing/Procedures: Your physician has requested that you have an echocardiogram. Echocardiography is a painless test that uses sound waves to create images of your heart. It provides your doctor with information about the size and shape of your heart and how well your heart's chambers and valves are working. This procedure takes approximately one hour. There are no restrictions for this procedure. Please do NOT wear cologne, perfume, aftershave, or lotions (deodorant is allowed). Please arrive 15 minutes prior to your appointment time.  Please note: We ask at that you not bring children with you during ultrasound (echo/ vascular) testing. Due to room size and safety concerns, children are not allowed in the ultrasound rooms during exams. Our front office staff cannot provide observation of children in our lobby area while testing is being conducted. An adult accompanying a patient to their appointment will only be allowed in the ultrasound room at the discretion of the ultrasound technician under special circumstances. We apologize for any inconvenience.   ZIO XT- Long Term Monitor Instructions  Your physician has requested you wear a ZIO patch monitor for 14 days.  This is a single patch monitor. [employer] supplies one patch monitor per enrollment. Additional stickers are not  available. Please do not apply patch if you will be having a Nuclear Stress Test,  Echocardiogram, Cardiac CT, MRI, or Chest Xray during the period you would be wearing the  monitor. The patch cannot be worn during these tests. You cannot remove and re-apply the  ZIO XT patch monitor.  Your ZIO patch monitor will be mailed 3 day USPS to your address on file. It may take 3-5 days  to receive your monitor after you have been enrolled.  Once you have received your monitor, please review the enclosed instructions. Your monitor  has already been registered assigning a specific monitor serial # to you.  Billing and Patient Assistance Program Information  We have supplied [employer] with any of your insurance information on file for billing purposes. [employer] offers a sliding scale Patient Assistance Program for patients that do not have  insurance, or whose insurance does not completely cover the cost of the ZIO monitor.  You must apply for the Patient Assistance Program to qualify for this discounted rate.  To apply, please call [employer] at [phone], select option 4, select option 2, ask to apply for  Patient Assistance Program. Meredeth will ask your household income, and how many people  are in your household. They will quote your out-of-pocket cost based on that information.  [employer] will also be able to set up a 77-month, interest-free payment plan if needed.  Applying the monitor   Shave hair from upper left chest.  Hold abrader disc by orange tab. Rub abrader in 40 strokes over the upper left chest as  indicated in your monitor instructions.  Clean  area with 4 enclosed alcohol pads. Let dry.  Apply patch as indicated in monitor instructions. Patch will be placed under collarbone on left  side of chest with arrow pointing upward.  Rub patch adhesive wings for 2 minutes. Remove white label marked 1. Remove the white  label marked 2. Rub patch adhesive wings for 2 additional minutes.   While looking in a mirror, press and release button in center of patch. A small green light will  flash 3-4 times. This will be your only indicator that the monitor has been turned on.  Do not shower for the first 24 hours. You may shower after the first 24 hours.  Press the button if you feel a symptom. You will hear a small click. Record Date, Time and  Symptom in the Patient Logbook.  When you are ready to remove the patch, follow instructions on the last 2 pages of Patient  Logbook. Stick patch monitor onto the last page of Patient Logbook.  Place Patient Logbook in the blue and white box. Use locking tab on box and tape box closed  securely. The blue and white box has prepaid postage on it. Please place it in the mailbox as  soon as possible. Your physician should have your test results approximately 7 days after the  monitor has been mailed back to [facility].  Call [facility] Customer Care at [phone] if you have questions regarding  your ZIO XT patch monitor. Call them immediately if you see an orange light blinking on your  monitor.  If your monitor falls off in less than 4 days, contact our Monitor department at [phone].  If your monitor becomes loose or falls off after 4 days call [employer] at [phone] for  suggestions on securing your monitor   Follow-Up: At [facility], you and your health needs are our priority.  As part of our continuing mission to provide you with exceptional heart care, our providers are all part of one team.  This team includes your primary Cardiologist (physician) and Advanced Practice Providers or APPs (Physician Assistants and Nurse Practitioners) who all work together to provide you with the care you need, when you need it.  Your next appointment:   6 month(s)  Provider:   Arun K [name], MD or Glendia Ferrier, PA-C          We recommend signing up for the patient portal called MyChart.  Sign up information is  provided on this After Visit Summary.  MyChart is used to connect with patients for Virtual Visits (Telemedicine).  Patients are able to view lab/test results, encounter notes, upcoming appointments, etc.  Non-urgent messages can be sent to your provider as well.   To learn more about what you can do with MyChart, go to [email or web address].   Other Instructions

## 2024-03-09 NOTE — Assessment & Plan Note (Signed)
 Blood pressure is well controlled on Bystolic  and telmisartan . - Continue Bystolic  5 mg daily - Continue telmisartan  40 mg daily

## 2024-03-09 NOTE — Progress Notes (Unsigned)
Enrolled patient for a 14 day Zio XT monitor to be mailed to patients home  [name] to read

## 2024-03-10 ENCOUNTER — Ambulatory Visit: Admitting: Bariatrics

## 2024-03-10 ENCOUNTER — Encounter: Payer: Self-pay | Admitting: Bariatrics

## 2024-03-10 ENCOUNTER — Ambulatory Visit: Payer: Self-pay | Admitting: Physician Assistant

## 2024-03-10 VITALS — BP 138/76 | HR 74 | Temp 97.8°F | Ht 62.0 in | Wt 150.0 lb

## 2024-03-10 DIAGNOSIS — E785 Hyperlipidemia, unspecified: Secondary | ICD-10-CM

## 2024-03-10 DIAGNOSIS — I471 Supraventricular tachycardia, unspecified: Secondary | ICD-10-CM

## 2024-03-10 DIAGNOSIS — E119 Type 2 diabetes mellitus without complications: Secondary | ICD-10-CM | POA: Diagnosis not present

## 2024-03-10 DIAGNOSIS — E782 Mixed hyperlipidemia: Secondary | ICD-10-CM

## 2024-03-10 DIAGNOSIS — E669 Obesity, unspecified: Secondary | ICD-10-CM

## 2024-03-10 DIAGNOSIS — Z6827 Body mass index (BMI) 27.0-27.9, adult: Secondary | ICD-10-CM | POA: Diagnosis not present

## 2024-03-10 DIAGNOSIS — Z7985 Long-term (current) use of injectable non-insulin antidiabetic drugs: Secondary | ICD-10-CM | POA: Diagnosis not present

## 2024-03-10 DIAGNOSIS — E118 Type 2 diabetes mellitus with unspecified complications: Secondary | ICD-10-CM

## 2024-03-10 DIAGNOSIS — I2510 Atherosclerotic heart disease of native coronary artery without angina pectoris: Secondary | ICD-10-CM

## 2024-03-10 LAB — BASIC METABOLIC PANEL WITH GFR
BUN/Creatinine Ratio: 36 — AB (ref 12–28)
BUN: 26 mg/dL (ref 8–27)
CO2: 20 mmol/L (ref 20–29)
Calcium: 10 mg/dL (ref 8.7–10.3)
Chloride: 103 mmol/L (ref 96–106)
Creatinine, Ser: 0.72 mg/dL (ref 0.57–1.00)
Glucose: 90 mg/dL (ref 70–99)
Potassium: 4.6 mmol/L (ref 3.5–5.2)
Sodium: 142 mmol/L (ref 134–144)
eGFR: 87 mL/min/{1.73_m2} (ref >59)

## 2024-03-10 LAB — CBC
Hematocrit: 41.2 % (ref 34.0–46.6)
Hemoglobin: 12.8 g/dL (ref 11.1–15.9)
MCH: 28 pg (ref 26.6–33.0)
MCHC: 31.1 g/dL — ABNORMAL LOW (ref 31.5–35.7)
MCV: 90 fL (ref 79–97)
Platelets: 290 10*3/uL (ref 150–450)
RBC: 4.57 x10E6/uL (ref 3.77–5.28)
RDW: 12.8 % (ref 11.7–15.4)
WBC: 7.4 10*3/uL (ref 3.4–10.8)

## 2024-03-10 LAB — TSH: TSH: 0.747 u[IU]/mL (ref 0.450–4.500)

## 2024-03-10 MED ORDER — TIRZEPATIDE 12.5 MG/0.5ML ~~LOC~~ SOAJ
12.5000 mg | SUBCUTANEOUS | 0 refills | Status: DC
Start: 2024-03-10 — End: 2024-06-15

## 2024-03-10 NOTE — Progress Notes (Signed)
 WEIGHT SUMMARY AND BIOMETRICS  Weight Lost Since Last Visit: 4lb  Weight Gained Since Last Visit: 0   Vitals Temp: 97.8 F (36.6 C) BP: 138/76 Pulse Rate: 74 SpO2: 100 %   Anthropometric Measurements Height: 5' 2 (1.575 m) Weight: 150 lb (68 kg) BMI (Calculated): 27.43 Weight at Last Visit: 154lb Weight Lost Since Last Visit: 4lb Weight Gained Since Last Visit: 0 Starting Weight: 197lb Total Weight Loss (lbs): 47 lb (21.3 kg)   Body Composition  Body Fat %: 40.1 % Fat Mass (lbs): 60.4 lbs Muscle Mass (lbs): 85.8 lbs Total Body Water (lbs): 60 lbs Visceral Fat Rating : 11   Other Clinical Data Fasting: no Labs: no Today's Visit #: 24 Starting Date: 03/22/22    OBESITY Suzanne Conner is here to discuss her progress with her obesity treatment plan along with follow-up of her obesity related diagnoses.    Nutrition Plan: the Category 1 plan - 85% adherence.  Current exercise: walking/exercise bike/weight resistance  Interim History:  She is down another 4 lbs since her last visit. She has had some cravings.  Eating all of the food on the plan., Protein intake is as prescribed, Is not skipping meals, and Water intake is adequate.   Pharmacotherapy: Suzanne Conner is on Mounjaro  12.5 mg SQ weekly Adverse side effects: None Hunger is moderately controlled.  Cravings are moderately controlled.  Assessment/Plan:   Type II Diabetes HgbA1c is at goal. Last A1c was 4.9 CBGs: Fasting 90's      Episodes of hypoglycemia: no Medication(s): Mounjaro  12.5 mg SQ weekly  Lab Results  Component Value Date   HGBA1C 4.9 12/11/2023   HGBA1C 5.5 04/19/2023   HGBA1C 5.4 09/21/2022   Lab Results  Component Value Date   LDLCALC 69 04/19/2023   CREATININE 0.72 03/09/2024   Lab Results  Component Value Date   GFR 84.42 04/19/2023   GFR 87.12 05/11/2021   GFR 84.48  10/05/2020    Plan: Continue and refill Mounjaro  12.5 mg SQ weekly Continue all other medications.  Will keep all carbohydrates low both sweets and starches.  Will continue exercise regimen to 30 to 60 minutes on most days of the week.  Information sheet on [employer].   Hyperlipidemia LDL is at goal. Medication(s): Crestor   Cardiovascular risk factors: advanced age (older than 69 for men, 72 for women), diabetes mellitus, dyslipidemia, hypertension, obesity (BMI >= 30 kg/m2), and sedentary lifestyle  Lab Results  Component Value Date   CHOL 132 04/19/2023   HDL 45.50 04/19/2023   LDLCALC 69 04/19/2023   TRIG 85.0 04/19/2023   CHOLHDL 3 04/19/2023   Lab Results  Component Value Date   ALT 20 04/19/2023   AST 19 04/19/2023   ALKPHOS 58 04/19/2023   BILITOT 0.4 04/19/2023   The 10-year ASCVD risk score (Arnett DK, et al., 2019) is: 25.1%   Values used  to calculate the score:     Age: 78 years     Clincally relevant sex: Female     Is Non-Hispanic African American: Yes     Diabetic: Yes     Tobacco smoker: No     Systolic Blood Pressure: 138 mmHg     Is BP treated: Yes     HDL Cholesterol: 45.5 mg/dL     Total Cholesterol: 132 mg/dL  Plan:  Continue statin.  She will minimize unhealthy snacking. Discussed healthy snacks. Will avoid all trans fats.  Will read labels Will minimize saturated fats except the following: low fat meats in moderation, diary, and limited dark chocolate.       Generalized Obesity: Current BMI BMI (Calculated): 27.43   Pharmacotherapy Plan Continue and refill  Mounjaro  12.5 mg SQ weekly  Suzanne Conner is currently in the action stage of change. As such, her goal is to continue with weight loss efforts.  She has agreed to the Category 1 plan.  Exercise goals: Older adults should determine their level of effort for physical activity relative to their level of fitness.   Behavioral modification strategies: increasing lean protein intake,  decrease eating out, meal planning , increase water intake, better snacking choices, planning for success, avoiding temptations, keep healthy foods in the home, measure portion sizes, work on smaller portions, and mindful eating.  Suzanne Conner has agreed to follow-up with our clinic in 4 weeks.     Objective:   VITALS: Per patient if applicable, see vitals. GENERAL: Alert and in no acute distress. CARDIOPULMONARY: No increased WOB. Speaking in clear sentences.  PSYCH: Pleasant and cooperative. Speech normal rate and rhythm. Affect is appropriate. Insight and judgement are appropriate. Attention is focused, linear, and appropriate.  NEURO: Oriented as arrived to appointment on time with no prompting.   Attestation Statements:   This was prepared with the assistance of Engineer, civil (consulting).  Occasional wrong-word or sound-a-like substitutions may have occurred due to the inherent limitations of voice recognition   Clayborne Daring, DO

## 2024-03-31 DIAGNOSIS — I83892 Varicose veins of left lower extremities with other complications: Secondary | ICD-10-CM | POA: Diagnosis not present

## 2024-04-07 ENCOUNTER — Encounter: Payer: Self-pay | Admitting: Bariatrics

## 2024-04-07 ENCOUNTER — Ambulatory Visit (INDEPENDENT_AMBULATORY_CARE_PROVIDER_SITE_OTHER): Admitting: Bariatrics

## 2024-04-07 VITALS — BP 131/75 | HR 75 | Temp 98.2°F | Ht 62.0 in | Wt 149.0 lb

## 2024-04-07 DIAGNOSIS — E669 Obesity, unspecified: Secondary | ICD-10-CM

## 2024-04-07 DIAGNOSIS — I10 Essential (primary) hypertension: Secondary | ICD-10-CM | POA: Diagnosis not present

## 2024-04-07 DIAGNOSIS — E119 Type 2 diabetes mellitus without complications: Secondary | ICD-10-CM | POA: Diagnosis not present

## 2024-04-07 DIAGNOSIS — Z6827 Body mass index (BMI) 27.0-27.9, adult: Secondary | ICD-10-CM

## 2024-04-07 DIAGNOSIS — Z7985 Long-term (current) use of injectable non-insulin antidiabetic drugs: Secondary | ICD-10-CM

## 2024-04-07 DIAGNOSIS — F5089 Other specified eating disorder: Secondary | ICD-10-CM | POA: Diagnosis not present

## 2024-04-07 DIAGNOSIS — E118 Type 2 diabetes mellitus with unspecified complications: Secondary | ICD-10-CM

## 2024-04-07 MED ORDER — BUPROPION HCL ER (SR) 100 MG PO TB12: 100.0000 mg | ORAL_TABLET | Freq: Every day | ORAL | 0 refills | Status: DC

## 2024-04-07 NOTE — Progress Notes (Signed)
 WEIGHT SUMMARY AND BIOMETRICS  Weight Lost Since Last Visit: 1lb  Weight Gained Since Last Visit: 0   Vitals Temp: 98.2 F (36.8 C) BP: 131/75 Pulse Rate: 75 SpO2: 99 %   Anthropometric Measurements Height: 5' 2 (1.575 m) Weight: 149 lb (67.6 kg) BMI (Calculated): 27.25 Weight at Last Visit: 150lb Weight Lost Since Last Visit: 1lb Weight Gained Since Last Visit: 0 Starting Weight: 197lb Total Weight Loss (lbs): 48 lb (21.8 kg)   Body Composition  Body Fat %: 40.5 % Fat Mass (lbs): 60.4 lbs Muscle Mass (lbs): 84 lbs Total Body Water (lbs): 59 lbs Visceral Fat Rating : 11   Other Clinical Data Fasting: no Labs: no Today's Visit #: 25 Starting Date: 03/22/22    OBESITY Suzanne Conner is here to discuss her progress with her obesity treatment plan along with follow-up of her obesity related diagnoses.    Nutrition Plan: the Category 1 plan - 70% adherence.  Current exercise: walking  Interim History:  She is down 1 lb since her last visit. She is dealing with her life.  Eating all of the food on the plan., Protein intake is as prescribed, Is not skipping meals, Water intake is adequate., Reports polyphagia, and Reports excessive cravings.   Pharmacotherapy: Suzanne Conner is on Mounjaro  12.5 mg SQ weekly Adverse side effects: None Hunger is moderately controlled.  Cravings are poorly controlled.  Assessment/Plan:   Type II Diabetes HgbA1c is at goal. Last A1c was 4.9 CBGs: Not checking      Episodes of hypoglycemia: no Medication(s): Mounjaro  12.5 mg SQ weekly  Lab Results  Component Value Date   HGBA1C 4.9 12/11/2023   HGBA1C 5.5 04/19/2023   HGBA1C 5.4 09/21/2022   Lab Results  Component Value Date   LDLCALC 69 04/19/2023   CREATININE 0.72 03/09/2024   Lab Results  Component Value Date   GFR 84.42 04/19/2023   GFR 87.12 05/11/2021   GFR  84.48 10/05/2020    Plan: Continue Mounjaro  12.5 mg SQ weekly Continue all other medications.  Will keep all carbohydrates low both sweets and starches.  Will continue exercise regimen to 30 to 60 minutes on most days of the week.  Aim for 7 to 9 hours of sleep nightly.  Eat more low glycemic index foods.   Hypertension Hypertension well controlled.  Medication(s): Bystolic  5 mg and Micardis  40 mg  BP Readings from Last 3 Encounters:  04/07/24 131/75  03/10/24 138/76  03/09/24 (!) 127/50   Lab Results  Component Value Date   CREATININE 0.72 03/09/2024   CREATININE 0.70 04/19/2023   CREATININE 0.69 02/20/2022   Lab Results  Component Value Date   GFR 84.42 04/19/2023   GFR 87.12 05/11/2021   GFR 84.48 10/05/2020    Plan: Continue all antihypertensives at current dosages. No added salt. Will keep sodium content to 1,500 mg or less per  day.    Eating disorder/emotional eating Suzanne Conner has had issues with stress eating, emotional eating, and boredom eating. Currently this is poorly controlled. Overall mood is stable. Denies suicidal/homicidal ideation. Medication(s): none, She has tried Topamax  in the past but had side effects.   Plan: Rx: Wellbutrin  100 mg 1 po daily, # 30 with no refills.   Specifically regarding patient's less desirable eating habits and patterns, we employed the technique of small changes when she cannot fully commit to her prudent nutritional plan. Discussed distractions to curb eating behaviors. Discussed activities to do with one's hands in the evening  Be sure to get adequate rest as lack of rest can trigger appetite.  Have plan in place for stressful events.  Consider other rewards besides food.       Generalized Obesity: Current BMI BMI (Calculated): 27.25   Pharmacotherapy Plan Continue  Mounjaro  12.5 mg SQ weekly  Suzanne Conner is currently in the action stage of change. As such, her goal is to continue with weight loss efforts.  She has  agreed to the Category 1 plan.  Exercise goals: Older adults should determine their level of effort for physical activity relative to their level of fitness.   Behavioral modification strategies: increasing lean protein intake, decreasing simple carbohydrates , no meal skipping, increase water intake, better snacking choices, planning for success, increasing vegetables, increasing fiber rich foods, decrease snacking , avoiding temptations, keep healthy foods in the home, keep a strict food journal, weigh protein portions, and mindful eating.  Suzanne Conner has agreed to follow-up with our clinic in 4 weeks.    Objective:   VITALS: Per patient if applicable, see vitals. GENERAL: Alert and in no acute distress. CARDIOPULMONARY: No increased WOB. Speaking in clear sentences.  PSYCH: Pleasant and cooperative. Speech normal rate and rhythm. Affect is appropriate. Insight and judgement are appropriate. Attention is focused, linear, and appropriate.  NEURO: Oriented as arrived to appointment on time with no prompting.   Attestation Statements:   This was prepared with the assistance of Engineer, civil (consulting).  Occasional wrong-word or sound-a-like substitutions may have occurred due to the inherent limitations of voice recognition   Suzanne Daring, DO

## 2024-04-16 DIAGNOSIS — I83892 Varicose veins of left lower extremities with other complications: Secondary | ICD-10-CM | POA: Diagnosis not present

## 2024-04-16 DIAGNOSIS — I83812 Varicose veins of left lower extremities with pain: Secondary | ICD-10-CM | POA: Diagnosis not present

## 2024-04-20 ENCOUNTER — Other Ambulatory Visit: Payer: Self-pay | Admitting: Internal Medicine

## 2024-04-21 DIAGNOSIS — I471 Supraventricular tachycardia, unspecified: Secondary | ICD-10-CM

## 2024-04-21 DIAGNOSIS — R000 Tachycardia, unspecified: Secondary | ICD-10-CM | POA: Diagnosis not present

## 2024-04-21 DIAGNOSIS — I2510 Atherosclerotic heart disease of native coronary artery without angina pectoris: Secondary | ICD-10-CM | POA: Diagnosis not present

## 2024-04-22 ENCOUNTER — Ambulatory Visit (HOSPITAL_COMMUNITY)
Admission: RE | Admit: 2024-04-22 | Discharge: 2024-04-22 | Disposition: A | Discharge status: Home / Self Care | Source: Ambulatory Visit | Attending: Cardiology | Admitting: Cardiology

## 2024-04-22 DIAGNOSIS — R0600 Dyspnea, unspecified: Secondary | ICD-10-CM | POA: Diagnosis not present

## 2024-04-22 DIAGNOSIS — I471 Supraventricular tachycardia, unspecified: Secondary | ICD-10-CM

## 2024-04-22 DIAGNOSIS — D860 Sarcoidosis of lung: Secondary | ICD-10-CM | POA: Diagnosis not present

## 2024-04-22 DIAGNOSIS — R000 Tachycardia, unspecified: Secondary | ICD-10-CM

## 2024-04-22 DIAGNOSIS — I2510 Atherosclerotic heart disease of native coronary artery without angina pectoris: Secondary | ICD-10-CM | POA: Diagnosis not present

## 2024-04-22 LAB — ECHOCARDIOGRAM COMPLETE
Area-P 1/2: 4.31 cm2
S' Lateral: 2.9 cm

## 2024-05-05 ENCOUNTER — Ambulatory Visit: Admitting: Bariatrics

## 2024-05-21 DIAGNOSIS — I872 Venous insufficiency (chronic) (peripheral): Secondary | ICD-10-CM | POA: Diagnosis not present

## 2024-05-21 DIAGNOSIS — I83812 Varicose veins of left lower extremities with pain: Secondary | ICD-10-CM | POA: Diagnosis not present

## 2024-05-27 DIAGNOSIS — I83892 Varicose veins of left lower extremities with other complications: Secondary | ICD-10-CM | POA: Diagnosis not present

## 2024-06-09 DIAGNOSIS — I83892 Varicose veins of left lower extremities with other complications: Secondary | ICD-10-CM | POA: Diagnosis not present

## 2024-06-11 ENCOUNTER — Encounter: Admitting: Family Medicine

## 2024-06-12 ENCOUNTER — Ambulatory Visit

## 2024-06-15 ENCOUNTER — Ambulatory Visit (INDEPENDENT_AMBULATORY_CARE_PROVIDER_SITE_OTHER): Admitting: Bariatrics

## 2024-06-15 ENCOUNTER — Encounter: Payer: Self-pay | Admitting: Bariatrics

## 2024-06-15 ENCOUNTER — Telehealth: Payer: Self-pay | Admitting: Bariatrics

## 2024-06-15 DIAGNOSIS — E669 Obesity, unspecified: Secondary | ICD-10-CM | POA: Diagnosis not present

## 2024-06-15 DIAGNOSIS — Z6826 Body mass index (BMI) 26.0-26.9, adult: Secondary | ICD-10-CM

## 2024-06-15 DIAGNOSIS — Z7985 Long-term (current) use of injectable non-insulin antidiabetic drugs: Secondary | ICD-10-CM | POA: Diagnosis not present

## 2024-06-15 DIAGNOSIS — E119 Type 2 diabetes mellitus without complications: Secondary | ICD-10-CM | POA: Diagnosis not present

## 2024-06-15 DIAGNOSIS — R110 Nausea: Secondary | ICD-10-CM | POA: Diagnosis not present

## 2024-06-15 DIAGNOSIS — E118 Type 2 diabetes mellitus with unspecified complications: Secondary | ICD-10-CM

## 2024-06-15 MED ORDER — TIRZEPATIDE 12.5 MG/0.5ML ~~LOC~~ SOAJ
12.5000 mg | SUBCUTANEOUS | 0 refills | Status: AC
Start: 2024-06-15 — End: ?

## 2024-06-15 MED ORDER — TIRZEPATIDE 12.5 MG/0.5ML ~~LOC~~ SOAJ: 12.5000 mg | SUBCUTANEOUS | 0 refills | Status: DC

## 2024-06-15 MED ORDER — ONDANSETRON 4 MG PO TBDP: 4.0000 mg | ORAL_TABLET | Freq: Three times a day (TID) | ORAL | 0 refills | Status: AC | PRN

## 2024-06-15 NOTE — Addendum Note (Signed)
 Addended by: DELORES SHIELDS A on: 06/15/2024 04:29 PM   Modules accepted: Orders

## 2024-06-15 NOTE — Progress Notes (Signed)
 WEIGHT SUMMARY AND BIOMETRICS  Weight Lost Since Last Visit: 2lb  Weight Gained Since Last Visit: 0   Vitals Temp: 98 F (36.7 C) BP: (!) 145/78 Pulse Rate: 74 SpO2: 97 %   Anthropometric Measurements Height: 5' 2 (1.575 m) Weight: 147 lb (66.7 kg) BMI (Calculated): 26.88 Weight at Last Visit: 149lb Weight Lost Since Last Visit: 2lb Weight Gained Since Last Visit: 0 Starting Weight: 197lb Total Weight Loss (lbs): 50 lb (22.7 kg)   Body Composition  Body Fat %: 40.3 % Fat Mass (lbs): 59.4 lbs Muscle Mass (lbs): 83.6 lbs Total Body Water (lbs): 58 lbs Visceral Fat Rating : 11   Other Clinical Data Fasting: no Labs: no Today's Visit #: 26 Starting Date: 03/22/22    OBESITY Suzanne Conner is here to discuss her progress with her obesity treatment plan along with follow-up of her obesity related diagnoses.    Nutrition Plan: the Category 1 plan - 80-85% adherence.  Current exercise: walking  Interim History:  She is down 2 lbs since her last visit. She has done well overall.  Eating all of the food on the plan., Protein intake is as prescribed, Is skipping meals, and Water intake is adequate.   Pharmacotherapy: Suzanne Conner is on Mounjaro  12.5 mg SQ weekly Adverse side effects: Nausea Hunger is moderately controlled.  Cravings are moderately controlled.  Assessment/Plan:   Type II Diabetes HgbA1c is at goal. Last A1c was 4.9 Episodes of hypoglycemia: no Medication(s): Mounjaro  12.5 mg SQ weekly  Lab Results  Component Value Date   HGBA1C 4.9 12/11/2023   HGBA1C 5.5 04/19/2023   HGBA1C 5.4 09/21/2022   Lab Results  Component Value Date   LDLCALC 69 04/19/2023   CREATININE 0.72 03/09/2024   Lab Results  Component Value Date   GFR 84.42 04/19/2023   GFR 87.12 05/11/2021   GFR 84.48 10/05/2020    Plan: Continue and refill Mounjaro  12.5 mg  SQ weekly Continue all other medications.  Will keep all carbohydrates low both sweets and starches.  Will continue exercise regimen to 30 to 60 minutes on most days of the week.  Aim for 7 to 9 hours of sleep nightly.  Eat more low glycemic index foods.   Nausea:   Related to her GLP-1. The nausea has improved and she only has nausea occasionally.    Plan:  Rx: Zofran  4 mg every 8 hours PRN for nausea, # 20 with no refills.     Generalized Obesity: Current BMI BMI (Calculated): 26.88   Pharmacotherapy Plan Continue and refill  Mounjaro  12.5 mg SQ weekly  Memory is currently in the action stage of change. As such, her goal is to continue with weight loss efforts.  She has agreed to the Category 1 plan.  Exercise goals: Older adults should determine their level of effort for physical activity relative to their level of fitness.  Behavioral modification strategies: increasing lean protein intake, no meal skipping, meal planning , planning for success, and increasing vegetables.  Suzanne Conner has agreed to follow-up with our clinic in 4 weeks.    Objective:   VITALS: Per patient if applicable, see vitals. GENERAL: Alert and in no acute distress. CARDIOPULMONARY: No increased WOB. Speaking in clear sentences.  PSYCH: Pleasant and cooperative. Speech normal rate and rhythm. Affect is appropriate. Insight and judgement are appropriate. Attention is focused, linear, and appropriate.  NEURO: Oriented as arrived to appointment on time with no prompting.   Attestation Statements:   This was prepared with the assistance of Engineer, civil (consulting).  Occasional wrong-word or sound-a-like substitutions may have occurred due to the inherent limitations of voice recognition   Clayborne Daring, DO

## 2024-06-15 NOTE — Telephone Encounter (Signed)
 Pt is calling because her Rx was sent to [facility] and it should be sent to [facility]. Please cancel the [facility] order.

## 2024-06-17 ENCOUNTER — Ambulatory Visit

## 2024-06-17 VITALS — BP 120/60 | HR 60 | Temp 98.0°F | Ht 62.0 in | Wt 150.6 lb

## 2024-06-17 DIAGNOSIS — Z0000 Encounter for general adult medical examination without abnormal findings: Secondary | ICD-10-CM | POA: Diagnosis not present

## 2024-06-17 NOTE — Progress Notes (Signed)
 Subjective:   Suzanne Conner is a 78 y.o. who presents for a Medicare Wellness preventive visit.  As a reminder, Annual Wellness Visits don't include a physical exam, and some assessments may be limited, especially if this visit is performed virtually. We may recommend an in-person follow-up visit with your provider if needed.  Visit Complete: In person    Persons Participating in Visit: Patient.  AWV Questionnaire: No: Patient Medicare AWV questionnaire was not completed prior to this visit.  Cardiac Risk Factors include: advanced age (>58men, >61 women);diabetes mellitus;hypertension     Objective:    Today's Vitals   06/17/24 1152  BP: 120/60  Pulse: 60  Temp: 98 F (36.7 C)  TempSrc: Oral  SpO2: 93%  Weight: 150 lb 9.6 oz (68.3 kg)  Height: 5' 2 (1.575 m)   Body mass index is 27.55 kg/m.     06/17/2024   12:02 PM 03/18/2020    6:43 PM 06/12/2018    3:18 PM 05/30/2018    8:15 AM  Advanced Directives  Does Patient Have a Medical Advance Directive? Yes No Yes  Yes   Type of Estate agent of [place];Living will  Healthcare Power of [employer] Power of Attorney  Does patient want to make changes to medical advance directive?   No - Patient declined    Copy of Healthcare Power of Attorney in Chart? No - copy requested  No - copy requested  No - copy requested      Data saved with a previous flowsheet row definition    Current Medications (verified) Outpatient Encounter Medications as of 06/17/2024  Medication Sig   albuterol  (VENTOLIN  HFA) 108 (90 Base) MCG/ACT inhaler Inhale 2 puffs into the lungs every 6 (six) hours as needed for wheezing or shortness of breath.   aspirin  EC 81 MG tablet Take 81 mg by mouth daily.   buPROPion  ER (WELLBUTRIN  SR) 100 MG 12 hr tablet Take 1 tablet (100 mg total) by mouth daily. (Patient not taking: Reported on 06/15/2024)   Cholecalciferol (VITAMIN D ) 2000 units CAPS Take 2,000 Units by mouth daily.    Clobetasol  Propionate 0.05 % shampoo Use a small amount on the affected areas twice a day as needed.   empagliflozin  (JARDIANCE ) 10 MG TABS tablet Take 1 tablet (10 mg total) by mouth daily before breakfast.   fluticasone -salmeterol (WIXELA INHUB) 500-50 MCG/ACT AEPB USE 1 INHALATION IN THE MORNING AND AT BEDTIME   hydrocortisone  valerate cream (WESTCORT ) 0.2 % Apply 1 application topically 2 (two) times daily.   latanoprost (XALATAN) 0.005 % ophthalmic solution Place 1 drop into both eyes at bedtime.   Melatonin 10 MG TABS Take 1 tablet by mouth at bedtime as needed.   Multiple Vitamins-Minerals (CENTRUM SILVER PO) Take 1 tablet by mouth daily.    nebivolol  (BYSTOLIC ) 5 MG tablet TAKE 1 TABLET AT BEDTIME (CHANGED TIME OF DAY)   omeprazole  (PRILOSEC) 40 MG capsule Take 1 capsule (40 mg total) by mouth 2 (two) times daily.   ondansetron  (ZOFRAN -ODT) 4 MG disintegrating tablet Take 1 tablet (4 mg total) by mouth every 8 (eight) hours as needed for nausea or vomiting.   OPTIVE 0.5-0.9 % ophthalmic solution Place 1 drop into both eyes 2 (two) times daily.   Probiotic Product (TRUNATURE DIGESTIVE PROBIOTIC) CAPS Take 1 capsule by mouth daily.    RESTASIS 0.05 % ophthalmic emulsion Place 1 drop into both eyes in the morning and at bedtime.   rosuvastatin  (CRESTOR ) 5 MG tablet  TAKE 1 TABLET DAILY   telmisartan  (MICARDIS ) 40 MG tablet TAKE 1 TABLET DAILY   tirzepatide  (MOUNJARO ) 12.5 MG/0.5ML Pen Inject 12.5 mg into the skin once a week.   umeclidinium bromide  (INCRUSE ELLIPTA ) 62.5 MCG/ACT AEPB Inhale 1 puff into the lungs daily.   No facility-administered encounter medications on file as of 06/17/2024.    Allergies (verified) Paxil  [paroxetine ], Penicillins, Atorvastatin , and Simvastatin    History: Past Medical History:  Diagnosis Date   Arthritis    left hip; right wrist (06/12/2018)   Asthma    Depression    Family history of polyps in the colon    GERD (gastroesophageal reflux disease)     Glaucoma    Hypertension    Paroxysmal SVT (supraventricular tachycardia) 03/23/2022   2 Supraventricular Tachycardia runs occurred, the run with the fastest interval lasting 19 beats with a max rate of 218 bpm    Type 2 diabetes, diet controlled (HCC)    Past Surgical History:  Procedure Laterality Date   BUNIONECTOMY Right    CATARACT EXTRACTION W/ INTRAOCULAR LENS IMPLANT Right    JOINT REPLACEMENT     REPLACEMENT TOTAL KNEE Right 2015   REVERSE SHOULDER ARTHROPLASTY Left 06/12/2018   REVERSE SHOULDER ARTHROPLASTY Left 06/12/2018   Procedure: LEFT REVERSE SHOULDER ARTHROPLASTY;  Surgeon: Melita Drivers, MD;  Location: [facility] OR;  Service: Orthopedics;  Laterality: Left;   SHOULDER ARTHROSCOPY W/ ROTATOR CUFF REPAIR Right 2015   TUBAL LIGATION     Family History  Problem Relation Age of Onset   Asthma Brother    Diabetes Brother    Dementia Mother    Cancer Father    Heart disease Sister    Diabetes Brother    Diabetes Brother    Healthy Son    Healthy Daughter    Social History   Socioeconomic History   Marital status: Married    Spouse name: Not on file   Number of children: Not on file   Years of education: Not on file   Highest education level: Not on file  Occupational History   Not on file  Tobacco Use   Smoking status: Never   Smokeless tobacco: Never  Vaping Use   Vaping status: Never Used  Substance and Sexual Activity   Alcohol use: Yes    Alcohol/week: 5.0 standard drinks of alcohol    Types: 5 Glasses of wine per week   Drug use: Never    Comment: 06/12/2018 CBD oil for pain   Sexual activity: Yes  Other Topics Concern   Not on file  Social History Narrative   Not on file   Social Drivers of Health   Financial Resource Strain: Low Risk  (06/17/2024)   Overall Financial Resource Strain (CARDIA)    Difficulty of Paying Living Expenses: Not hard at all  Food Insecurity: No Food Insecurity (06/17/2024)   Hunger Vital Sign    Worried About Running Out  of Food in the Last Year: Never true    Ran Out of Food in the Last Year: Never true  Transportation Needs: No Transportation Needs (06/17/2024)   PRAPARE - Administrator, Civil Service (Medical): No    Lack of Transportation (Non-Medical): No  Physical Activity: Insufficiently Active (06/17/2024)   Exercise Vital Sign    Days of Exercise per Week: 3 days    Minutes of Exercise per Session: 40 min  Stress: No Stress Concern Present (06/17/2024)   [employer] of Occupational Health - Occupational Stress  Questionnaire    Feeling of Stress: Not at all  Social Connections: Socially Integrated (06/17/2024)   Social Connection and Isolation Panel    Frequency of Communication with Friends and Family: More than three times a week    Frequency of Social Gatherings with Friends and Family: More than three times a week    Attends Religious Services: More than 4 times per year    Active Member of [employer] or Organizations: Yes    Attends Engineer, structural: More than 4 times per year    Marital Status: Married    Tobacco Counseling Counseling given: Not Answered    Clinical Intake:  Pre-visit preparation completed: Yes  Pain : No/denies pain     BMI - recorded: 27.55 Nutritional Status: BMI 25 -29 Overweight Nutritional Risks: None Diabetes: Yes CBG done?: No Did pt. bring in CBG monitor from home?: No  Lab Results  Component Value Date   HGBA1C 4.9 12/11/2023   HGBA1C 5.5 04/19/2023   HGBA1C 5.4 09/21/2022     How often do you need to have someone help you when you read instructions, pamphlets, or other written materials from your doctor or pharmacy?: 1 - Never  Interpreter Needed?: No  Information entered by :: Suzanne Blush LPN   Activities of Daily Living     06/17/2024   12:00 PM  In your present state of health, do you have any difficulty performing the following activities:  Hearing? 0  Vision? 0  Difficulty concentrating or making  decisions? 0  Walking or climbing stairs? 0  Dressing or bathing? 0  Doing errands, shopping? 0  Preparing Food and eating ? N  Using the Toilet? N  In the past six months, have you accidently leaked urine? N  Do you have problems with loss of bowel control? N  Managing your Medications? N  Managing your Finances? N  Housekeeping or managing your Housekeeping? N    Patient Care Team: Mercer Clotilda SAUNDERS, MD as PCP - General (Family Medicine) [name], Arun K, MD as PCP - Cardiology (Cardiology) Ernie Cough, MD as Consulting Physician (Orthopedic Surgery) Boisvert, Waynette Pine, MD as Referring Physician (Ophthalmology) Lelon Lonni BIRCH, MD as Consulting Physician (Surgery)  I have updated your Care Teams any recent Medical Services you may have received from other providers in the past year.     Assessment:   This is a routine wellness examination for Suzanne Conner.  Hearing/Vision screen Hearing Screening - Comments:: Denies hearing difficulties   Vision Screening - Comments:: Wears rx glasses - up to date with routine eye exams with  [facility]   Goals Addressed               This Visit's Progress     Continue physical activity (pt-stated)        Remain active.       Depression Screen     06/17/2024   12:00 PM 12/11/2023   11:18 AM 04/19/2023    1:13 PM 09/21/2022    8:42 AM 05/23/2022    9:09 AM 02/07/2022   10:24 AM 12/06/2021    9:14 AM  PHQ 2/9 Scores  PHQ - 2 Score 0 0 0 0 0 0 0  PHQ- 9 Score  1 1 2 2 2 2     Fall Risk     06/17/2024   12:01 PM 04/19/2023    1:13 PM 09/21/2022    8:42 AM 05/23/2022    9:08 AM 12/06/2021  9:13 AM  Fall Risk   Falls in the past year? 0 0 0 0 0  Number falls in past yr: 0 0 0 0 0  Injury with Fall? 0 0 0 0 0  Risk for fall due to : No Fall Risks No Fall Risks No Fall Risks No Fall Risks No Fall Risks  Follow up Falls evaluation completed Falls evaluation completed Falls evaluation completed  Falls evaluation completed   Falls evaluation completed      Data saved with a previous flowsheet row definition    MEDICARE RISK AT HOME:  Medicare Risk at Home Any stairs in or around the home?: Yes If so, are there any without handrails?: No Home free of loose throw rugs in walkways, pet beds, electrical cords, etc?: Yes Adequate lighting in your home to reduce risk of falls?: Yes Life alert?: No Use of a cane, walker or w/c?: No Grab bars in the bathroom?: Yes Shower chair or bench in shower?: Yes Elevated toilet seat or a handicapped toilet?: Yes  TIMED UP AND GO:  Was the test performed?  Yes  Length of time to ambulate 10 feet: 10 sec Gait steady and fast without use of assistive device  Cognitive Function: 6CIT completed        06/17/2024   12:02 PM  6CIT Screen  What Year? 0 points  What month? 0 points  What time? 0 points  Count back from 20 0 points  Months in reverse 0 points  Repeat phrase 0 points  Total Score 0 points    Immunizations Immunization History  Administered Date(s) Administered   Fluad Quad(high Dose 65+) 06/22/2020, 07/06/2022   INFLUENZA, HIGH DOSE SEASONAL PF 05/12/2019, 06/10/2021   Influenza-Unspecified 05/29/2018, 05/12/2019, 06/12/2024   Moderna Covid-19 Fall Seasonal Vaccine 40yrs & older 05/29/2022   Moderna Covid-19 Vaccine Bivalent Booster 64yrs & up 05/13/2021   PFIZER Comirnaty(Gray Top)Covid-19 Tri-Sucrose Vaccine 12/08/2020   PFIZER(Purple Top)SARS-COV-2 Vaccination 10/01/2019, 10/22/2019, 06/10/2020, 06/17/2020, 12/08/2020   PNEUMOCOCCAL CONJUGATE-20 07/16/2022   Pfizer Covid-19 Vaccine Bivalent Booster 70yrs & up 07/06/2022   Pfizer(Comirnaty)Fall Seasonal Vaccine 12 years and older 06/01/2022   Pneumococcal Polysaccharide-23 06/13/2018   Pneumococcal-Unspecified 07/06/2022   RSV,unspecified 07/06/2022   Unspecified SARS-COV-2 Vaccination 05/11/2024   Zoster Recombinant(Shingrix) 03/10/2019, 05/12/2019   Zoster, Live 04/27/2008    Screening  Tests Health Maintenance  Topic Date Due   Diabetic kidney evaluation - Urine ACR  Never done   FOOT EXAM  05/24/2023   OPHTHALMOLOGY EXAM  05/27/2024   HEMOGLOBIN A1C  06/11/2024   DTaP/Tdap/Td (1 - Tdap) 12/10/2024 (Originally 07/26/1965)   DEXA SCAN  12/10/2024 (Originally 07/26/2012)   COVID-19 Vaccine (10 - 2024-25 season) 11/08/2024   Diabetic kidney evaluation - eGFR measurement  03/09/2025   Medicare Annual Wellness (AWV)  06/17/2025   Pneumococcal Vaccine: 50+ Years  Completed   Influenza Vaccine  Completed   Hepatitis C Screening  Completed   Zoster Vaccines- Shingrix  Completed   Meningococcal B Vaccine  Aged Out   Colonoscopy  Discontinued    Health Maintenance Items Addressed:   Additional Screening:  Vision Screening: Recommended annual ophthalmology exams for early detection of glaucoma and other disorders of the eye. Is the patient up to date with their annual eye exam?  Yes  Who is the provider or what is the name of the office in which the patient attends annual eye exams? [facility]  Dental Screening: Recommended annual dental exams for proper oral hygiene  Community Resource Referral / Chronic Care Management: CRR required this visit?  No   CCM required this visit?  No   Plan:    I have personally reviewed and noted the following in the patient's chart:   Medical and social history Use of alcohol, tobacco or illicit drugs  Current medications and supplements including opioid prescriptions. Patient is not currently taking opioid prescriptions. Functional ability and status Nutritional status Physical activity Advanced directives List of other physicians Hospitalizations, surgeries, and ER visits in previous 12 months Vitals Screenings to include cognitive, depression, and falls Referrals and appointments  In addition, I have reviewed and discussed with patient certain preventive protocols, quality metrics, and best practice  recommendations. A written personalized care plan for preventive services as well as general preventive health recommendations were provided to patient.   Suzanne LELON Blush, LPN   [id number]/09/[id number]   After Visit Summary: (In Person-Printed) AVS printed and given to the patient  Notes: Nothing significant to report at this time.

## 2024-06-17 NOTE — Patient Instructions (Addendum)
 Ms. Tatlock,  Thank you for taking the time for your Medicare Wellness Visit. I appreciate your continued commitment to your health goals. Please review the care plan we discussed, and feel free to reach out if I can assist you further.  Medicare recommends these wellness visits once per year to help you and your care team stay ahead of potential health issues. These visits are designed to focus on prevention, allowing your provider to concentrate on managing your acute and chronic conditions during your regular appointments.  Please note that Annual Wellness Visits do not include a physical exam. Some assessments may be limited, especially if the visit was conducted virtually. If needed, we may recommend a separate in-person follow-up with your provider.  Ongoing Care Seeing your primary care provider every 3 to 6 months helps us  monitor your health and provide consistent, personalized care.   Referrals If a referral was made during today's visit and you haven't received any updates within two weeks, please contact the referred provider directly to check on the status.  Recommended Screenings:  Health Maintenance  Topic Date Due   Yearly kidney health urinalysis for diabetes  Never done   Complete foot exam   05/24/2023   Eye exam for diabetics  05/27/2024   Hemoglobin A1C  06/11/2024   DTaP/Tdap/Td vaccine (1 - Tdap) 12/10/2024*   DEXA scan (bone density measurement)  12/10/2024*   COVID-19 Vaccine (10 - 2024-25 season) 11/08/2024   Yearly kidney function blood test for diabetes  03/09/2025   Medicare Annual Wellness Visit  06/17/2025   Pneumococcal Vaccine for age over 70  Completed   Flu Shot  Completed   Hepatitis C Screening  Completed   Zoster (Shingles) Vaccine  Completed   Meningitis B Vaccine  Aged Out   Colon Cancer Screening  Discontinued  *Topic was postponed. The date shown is not the original due date.       06/17/2024   12:02 PM  Advanced Directives  Does Patient  Have a Medical Advance Directive? Yes  Type of Estate agent of [place];Living will  Copy of Healthcare Power of Attorney in Chart? No - copy requested   Advance Care Planning is important because it: Ensures you receive medical care that aligns with your values, goals, and preferences. Provides guidance to your family and loved ones, reducing the emotional burden of decision-making during critical moments.  Vision: Annual vision screenings are recommended for early detection of glaucoma, cataracts, and diabetic retinopathy. These exams can also reveal signs of chronic conditions such as diabetes and high blood pressure.  Dental: Annual dental screenings help detect early signs of oral cancer, gum disease, and other conditions linked to overall health, including heart disease and diabetes.  Please see the attached documents for additional preventive care recommendations.

## 2024-06-23 DIAGNOSIS — M7989 Other specified soft tissue disorders: Secondary | ICD-10-CM | POA: Diagnosis not present

## 2024-06-23 DIAGNOSIS — I83812 Varicose veins of left lower extremities with pain: Secondary | ICD-10-CM | POA: Diagnosis not present

## 2024-06-23 DIAGNOSIS — I83892 Varicose veins of left lower extremities with other complications: Secondary | ICD-10-CM | POA: Diagnosis not present

## 2024-06-24 ENCOUNTER — Ambulatory Visit: Admitting: Family Medicine

## 2024-06-24 ENCOUNTER — Encounter: Payer: Self-pay | Admitting: Family Medicine

## 2024-06-24 VITALS — BP 138/64 | HR 78 | Temp 98.4°F | Wt 151.4 lb

## 2024-06-24 DIAGNOSIS — Z7984 Long term (current) use of oral hypoglycemic drugs: Secondary | ICD-10-CM

## 2024-06-24 DIAGNOSIS — J4540 Moderate persistent asthma, uncomplicated: Secondary | ICD-10-CM

## 2024-06-24 DIAGNOSIS — I10 Essential (primary) hypertension: Secondary | ICD-10-CM | POA: Diagnosis not present

## 2024-06-24 DIAGNOSIS — Z7985 Long-term (current) use of injectable non-insulin antidiabetic drugs: Secondary | ICD-10-CM

## 2024-06-24 DIAGNOSIS — E782 Mixed hyperlipidemia: Secondary | ICD-10-CM | POA: Diagnosis not present

## 2024-06-24 DIAGNOSIS — E119 Type 2 diabetes mellitus without complications: Secondary | ICD-10-CM

## 2024-06-24 DIAGNOSIS — F419 Anxiety disorder, unspecified: Secondary | ICD-10-CM

## 2024-06-24 DIAGNOSIS — E118 Type 2 diabetes mellitus with unspecified complications: Secondary | ICD-10-CM

## 2024-06-24 NOTE — Progress Notes (Signed)
 Established Patient Office Visit   Subjective  Patient ID: Suzanne Conner, female    DOB: [birth year]  Age: 78 y.o. MRN: [id number]  Chief Complaint  Patient presents with   Medical Management of Chronic Issues    Pt is a 78 yo female seen for 6 mo f/u.  Pt endorses there may have been some confusion regarding her appt for AWV last wk.  Pt thought she was seeing this provider.  Pt states she is doing a little better since last OFV with this provider. 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  Pt considering having her daughter who works from home help a few days/wk vs finding an Engineer, production.  Pt hesitant when has to leave him at home alone.  Pt still followed by healthy wt,  Now 151 lbs, was 200s.  States feels a difference in energy and jt pain.  Able to walk up stairs without difficulty. On monjuaro 12.5 mg wkly.  Denies n/v/d.  Has occasional constipation despite use of fiber.  Working on water intake.  Using wixela and incruse inhalers daily, albuterol  prn which seem to be making a difference in SOB.  Also followed by Cards.   Pt checking bp at home, typically 120s/80 and below.  This am bp was elevated at home 136/80.  Pt unsure if she was becoming anxious about today's appt.    Patient Active Problem List   Diagnosis Date Noted   BMI 32.0-32.9,adult 10/18/2022   Moderate persistent asthma without complication 08/06/2022   Eating disorder 07/19/2022   Other constipation 06/28/2022   Polyphagia 06/07/2022   Class 2 severe obesity with serious comorbidity and body mass index (BMI) of 36.0 to 36.9 in adult 06/07/2022   Shortness of breath 05/18/2022   Coronary artery disease involving native coronary artery of native heart without angina pectoris 05/17/2022   Type 2 diabetes mellitus with obesity 05/07/2022   OSA (obstructive sleep apnea) 05/04/2022   Paroxysmal SVT (supraventricular tachycardia) 03/23/2022   Excessive daytime  sleepiness 02/22/2022   Other fatigue 02/22/2022   Generalized obesity 11/15/2021   Hyperlipidemia 11/15/2021   Severe persistent asthma (HCC) 03/21/2021   Pulmonary sarcoidosis 03/21/2021   Seborrheic dermatitis 08/11/2020   Essential hypertension 03/26/2020   Strabismus 03/26/2020   Depression, major, single episode, mild 03/26/2020   Chronic pain of multiple joints 03/26/2020   Anxiety 03/26/2020   S/p reverse total shoulder arthroplasty 06/12/2018   Gastroesophageal reflux disease 05/02/2018   Tear of left rotator cuff 05/02/2018   Controlled type 2 diabetes mellitus with complication, without long-term current use of insulin  (HCC) 05/02/2018   S/P right rotator cuff repair 05/02/2018   Past Medical History:  Diagnosis Date   Arthritis    left hip; right wrist (06/12/2018)   Asthma    Depression    Family history of polyps in the colon    GERD (gastroesophageal reflux disease)    Glaucoma    Hypertension    Paroxysmal SVT (supraventricular tachycardia) 03/23/2022   2 Supraventricular Tachycardia runs occurred, the run with the fastest interval lasting 19 beats with a max rate of 218 bpm    Type 2 diabetes, diet controlled (HCC)    Past Surgical History:  Procedure Laterality Date   BUNIONECTOMY Right    CATARACT EXTRACTION W/ INTRAOCULAR LENS IMPLANT Right    JOINT REPLACEMENT     REPLACEMENT TOTAL KNEE Right 2015   REVERSE SHOULDER  ARTHROPLASTY Left 06/12/2018   REVERSE SHOULDER ARTHROPLASTY Left 06/12/2018   Procedure: LEFT REVERSE SHOULDER ARTHROPLASTY;  Surgeon: Melita Drivers, MD;  Location: [facility] OR;  Service: Orthopedics;  Laterality: Left;   SHOULDER ARTHROSCOPY W/ ROTATOR CUFF REPAIR Right 2015   TUBAL LIGATION     Social History   Tobacco Use   Smoking status: Never   Smokeless tobacco: Never  Vaping Use   Vaping status: Never Used  Substance Use Topics   Alcohol use: Yes    Alcohol/week: 5.0 standard drinks of alcohol    Types: 5 Glasses of wine per  week   Drug use: Never    Comment: 06/12/2018 CBD oil for pain   Family History  Problem Relation Age of Onset   Asthma Brother    Diabetes Brother    Dementia Mother    Cancer Father    Heart disease Sister    Diabetes Brother    Diabetes Brother    Healthy Son    Healthy Daughter    Allergies  Allergen Reactions   Paxil  [Paroxetine ]     Paxil  20 mg caused a tremor.   Penicillins Hives and Other (See Comments)    PATIENT HAS HAD A PCN REACTION WITH IMMEDIATE RASH, FACIAL/TONGUE/THROAT SWELLING, SOB, OR LIGHTHEADEDNESS WITH HYPOTENSION:  #  #  YES  #  #  INCLUDING HIVES AS A CHILD Has patient had a PCN reaction causing severe rash involving mucus membranes or skin necrosis: No Has patient had a PCN reaction that required hospitalization: No Has patient had a PCN reaction occurring within the last 10 years: No If all of the above answers are NO, then may proceed with Cephalosporin use. PATIENT HAS HAD A PCN REACTION WITH IMMEDIATE RASH, FACIAL/TONGUE/THROAT SWELLING, SOB, OR LIGHTHEADEDNESS WITH HYPOTENSION:  #  #  YES  #  #  INCLUDING HIVES AS A CHILD Has patient had a PCN reaction causing severe rash involving mucus membranes or skin necrosis: No Has patient had a PCN reaction that required hospitalization: No Has patient had a PCN reaction occurring within the last 10 years: No If all of the above answers are NO, then may proceed with Cephalosporin use.    Atorvastatin      Joint pain on 40mg    Simvastatin      Joint pain on 20mg  daily    ROS Negative unless stated above    Objective:     BP 138/64   Pulse 78   Temp 98.4 F (36.9 C) (Oral)   Wt 151 lb 6.4 oz (68.7 kg)   SpO2 99%   BMI 27.69 kg/m  BP Readings from Last 3 Encounters:  06/24/24 138/64  06/17/24 120/60  06/15/24 (!) 145/78   Wt Readings from Last 3 Encounters:  06/24/24 151 lb 6.4 oz (68.7 kg)  06/17/24 150 lb 9.6 oz (68.3 kg)  06/15/24 147 lb (66.7 kg)      Physical  Exam Constitutional:      General: She is not in acute distress.    Appearance: Normal appearance.  HENT:     Head: Normocephalic and atraumatic.     Nose: Nose normal.     Mouth/Throat:     Mouth: Mucous membranes are moist.  Cardiovascular:     Rate and Rhythm: Normal rate and regular rhythm.     Heart sounds: Normal heart sounds. No murmur heard.    No gallop.  Pulmonary:     Effort: Pulmonary effort is normal. No respiratory distress.  Breath sounds: Normal breath sounds. No wheezing, rhonchi or rales.  Skin:    General: Skin is warm and dry.  Neurological:     Mental Status: She is alert and oriented to person, place, and time.        06/24/2024   11:47 AM 06/17/2024   12:00 PM 12/11/2023   11:18 AM  Depression screen PHQ 2/9  Decreased Interest 0 0 0  Down, Depressed, Hopeless 1 0 0  PHQ - 2 Score 1 0 0  Altered sleeping 1  0  Tired, decreased energy 1  1  Change in appetite 0  0  Feeling bad or failure about yourself  0  0  Trouble concentrating 0  0  Moving slowly or fidgety/restless 0  0  Suicidal thoughts 0  0  PHQ-9 Score 3  1  Difficult doing work/chores Not difficult at all  Somewhat difficult      06/24/2024   11:48 AM 12/11/2023   11:19 AM 04/19/2023    1:14 PM 07/19/2021    5:17 PM  GAD 7 : Generalized Anxiety Score  Nervous, Anxious, on Edge 1 1 1 1   Control/stop worrying 0 0 0 1  Worry too much - different things 1 1 1 1   Trouble relaxing 1 0 0 1  Restless 0 0 0 0  Easily annoyed or irritable 0 0 0 0  Afraid - awful might happen 0 0 0 1  Total GAD 7 Score 3 2 2 5   Anxiety Difficulty Not difficult at all Somewhat difficult Somewhat difficult Somewhat difficult     No results found for any visits on 06/24/24.    Assessment & Plan:   Essential hypertension  Anxiety  Controlled type 2 diabetes mellitus with complication, without long-term current use of insulin  (HCC)  Moderate persistent asthma without complication  Mixed  hyperlipidemia  HTN controlled.  Slight elevation in reading today compared to patient's readings at home.  Continue lifestyle modifications.  Current medications including Bystolic  5 mg nightly, telmisartan  40 mg daily.  Crestor  5 mg daily. Continue follow-up with cardiology.  Anxiety stable.  PHQ-9 score 3 this visit.  GAD-7 score 3.  Discussed importance of self-care.  As patient is a caregiver advised to consider respite and aid options for her husband.  Continue to monitor.  DM2 well-controlled.  A1c 4.9% on 12/11/2023.  Continue current medications including Mounjaro  12.5 mg weekly and Jardiance  10 mg daily.  Given A1c consider stopping Jardiance .  ARB and statin.  Foot exam and eye exam due.  Congratulated on weight loss.  BMI now less than 30.  Healthy weight.  Has been stable.  Asthma well-controlled.  Continue Incruse Ellipta  and Wixela inhalers.  Albuterol  inhaler as needed.  Continue follow-up with pulmonology.   Return in about 6 months (around 12/23/2024) for chronic conditions.  Sooner if needed.    Clotilda JONELLE Single, MD

## 2024-07-14 ENCOUNTER — Encounter: Payer: Self-pay | Admitting: Bariatrics

## 2024-07-14 ENCOUNTER — Ambulatory Visit: Admitting: Bariatrics

## 2024-07-14 VITALS — BP 137/74 | HR 73 | Ht 62.0 in | Wt 146.0 lb

## 2024-07-14 DIAGNOSIS — E118 Type 2 diabetes mellitus with unspecified complications: Secondary | ICD-10-CM

## 2024-07-14 DIAGNOSIS — Z7985 Long-term (current) use of injectable non-insulin antidiabetic drugs: Secondary | ICD-10-CM | POA: Diagnosis not present

## 2024-07-14 DIAGNOSIS — E669 Obesity, unspecified: Secondary | ICD-10-CM | POA: Diagnosis not present

## 2024-07-14 DIAGNOSIS — E119 Type 2 diabetes mellitus without complications: Secondary | ICD-10-CM | POA: Diagnosis not present

## 2024-07-14 DIAGNOSIS — Z6826 Body mass index (BMI) 26.0-26.9, adult: Secondary | ICD-10-CM | POA: Diagnosis not present

## 2024-07-14 NOTE — Progress Notes (Signed)
 WEIGHT SUMMARY AND BIOMETRICS  Weight Lost Since Last Visit: 1lb  Weight Gained Since Last Visit: 0   Vitals BP: 137/74 Pulse Rate: 73 SpO2: 100 %   Anthropometric Measurements Height: 5' 2 (1.575 m) Weight: 146 lb (66.2 kg) BMI (Calculated): 26.7 Weight at Last Visit: 147lb Weight Lost Since Last Visit: 1lb Weight Gained Since Last Visit: 0 Starting Weight: 197lb Total Weight Loss (lbs): 51 lb (23.1 kg)   Body Composition  Body Fat %: 40 % Fat Mass (lbs): 58.4 lbs Muscle Mass (lbs): 83.4 lbs Total Body Water (lbs): 58.4 lbs Visceral Fat Rating : 11   Other Clinical Data Fasting: no Labs: no Today's Visit #: 74 Starting Date: 03/22/22    OBESITY Suzanne Conner is here to discuss her progress with her obesity treatment plan along with follow-up of her obesity related diagnoses.    Nutrition Plan: the Category 1 plan - 80% adherence.  Current exercise: walking  Interim History:  She is down 1 lb since her last visit. She states that she is comfortable with her weight.  Protein intake is as prescribed and Water intake is adequate.   Pharmacotherapy: Suzanne Conner is on Mounjaro  12.5 mg SQ weekly Adverse side effects: None Hunger is moderately controlled.  Cravings are moderately controlled.  Assessment/Plan:   Type II Diabetes HgbA1c is at goal. Last A1c was 4.9 Episodes of hypoglycemia: no Medication(s): Mounjaro  12.5 mg SQ weekly  Lab Results  Component Value Date   HGBA1C 4.9 12/11/2023   HGBA1C 5.5 04/19/2023   HGBA1C 5.4 09/21/2022   Lab Results  Component Value Date   LDLCALC 69 04/19/2023   CREATININE 0.72 03/09/2024   Lab Results  Component Value Date   GFR 84.42 04/19/2023   GFR 87.12 05/11/2021   GFR 84.48 10/05/2020    Plan: Continue Mounjaro  12.5 mg SQ weekly Will keep all carbohydrates low both sweets and starches.  Will  continue exercise regimen to 30 to 60 minutes on most days of the week.  Aim for 7 to 9 hours of sleep nightly.  Eat more low glycemic index foods.  Will be mindful of her food choices.  Will pick healthy snacks.    Generalized Obesity: Current BMI BMI (Calculated): 26.7   Pharmacotherapy Plan Continue  Mounjaro  12.5 mg SQ weekly  Suzanne Conner is currently in the action stage of change. As such, her goal is to continue with weight loss efforts.  She has agreed to the Category 1 plan.  Exercise goals: Older adults should determine their level of effort for physical activity relative to their level of fitness.   Behavioral modification strategies: increasing lean protein intake, decreasing simple carbohydrates , no meal skipping, decrease eating out, meal planning , increase water intake, better snacking choices, planning for success, increasing vegetables, keep healthy foods in the home, weigh protein portions, measure portion sizes, work on smaller portions,  and mindful eating.  Suzanne Conner has agreed to follow-up with our clinic in 4 weeks.      Objective:   VITALS: Per patient if applicable, see vitals. GENERAL: Alert and in no acute distress. CARDIOPULMONARY: No increased WOB. Speaking in clear sentences.  PSYCH: Pleasant and cooperative. Speech normal rate and rhythm. Affect is appropriate. Insight and judgement are appropriate. Attention is focused, linear, and appropriate.  NEURO: Oriented as arrived to appointment on time with no prompting.   Attestation Statements:   This was prepared with the assistance of Engineer, Civil (consulting).  Occasional wrong-word or sound-a-like substitutions may have occurred due to the inherent limitations of voice recognition   Suzanne Daring, DO

## 2024-07-16 ENCOUNTER — Ambulatory Visit (INDEPENDENT_AMBULATORY_CARE_PROVIDER_SITE_OTHER)

## 2024-07-16 DIAGNOSIS — J4550 Severe persistent asthma, uncomplicated: Secondary | ICD-10-CM

## 2024-07-16 LAB — PULMONARY FUNCTION TEST
DL/VA % pred: 109 %
DL/VA: 4.57 ml/min/mmHg/L
DLCO unc % pred: 77 %
DLCO unc: 13.8 ml/min/mmHg
FEF 25-75 Post: 1.29 L/s
FEF 25-75 Pre: 1 L/s
FEF2575-%Change-Post: 28 %
FEF2575-%Pred-Post: 85 %
FEF2575-%Pred-Pre: 66 %
FEV1-%Change-Post: 5 %
FEV1-%Pred-Post: 72 %
FEV1-%Pred-Pre: 68 %
FEV1-Post: 1.37 L
FEV1-Pre: 1.29 L
FEV1FVC-%Change-Post: 3 %
FEV1FVC-%Pred-Pre: 102 %
FEV6-%Change-Post: 2 %
FEV6-%Pred-Post: 72 %
FEV6-%Pred-Pre: 70 %
FEV6-Post: 1.73 L
FEV6-Pre: 1.69 L
FEV6FVC-%Pred-Post: 105 %
FEV6FVC-%Pred-Pre: 105 %
FVC-%Change-Post: 2 %
FVC-%Pred-Post: 68 %
FVC-%Pred-Pre: 66 %
FVC-Post: 1.73 L
FVC-Pre: 1.69 L
Post FEV1/FVC ratio: 79 %
Post FEV6/FVC ratio: 100 %
Pre FEV1/FVC ratio: 76 %
Pre FEV6/FVC Ratio: 100 %
RV % pred: 94 %
RV: 2.1 L
TLC % pred: 78 %
TLC: 3.73 L

## 2024-07-16 NOTE — Patient Instructions (Signed)
 Full PFT performed today.

## 2024-07-16 NOTE — Progress Notes (Signed)
 Full PFT performed today.

## 2024-07-24 DIAGNOSIS — I872 Venous insufficiency (chronic) (peripheral): Secondary | ICD-10-CM | POA: Diagnosis not present

## 2024-07-28 DIAGNOSIS — L218 Other seborrheic dermatitis: Secondary | ICD-10-CM | POA: Diagnosis not present

## 2024-07-31 ENCOUNTER — Encounter (HOSPITAL_BASED_OUTPATIENT_CLINIC_OR_DEPARTMENT_OTHER): Payer: Self-pay | Admitting: Pulmonary Disease

## 2024-07-31 ENCOUNTER — Other Ambulatory Visit (HOSPITAL_BASED_OUTPATIENT_CLINIC_OR_DEPARTMENT_OTHER): Payer: Self-pay

## 2024-07-31 ENCOUNTER — Other Ambulatory Visit (HOSPITAL_COMMUNITY): Payer: Self-pay

## 2024-07-31 ENCOUNTER — Ambulatory Visit (INDEPENDENT_AMBULATORY_CARE_PROVIDER_SITE_OTHER): Admitting: Pulmonary Disease

## 2024-07-31 VITALS — BP 136/61 | HR 79 | Ht 62.0 in | Wt 151.6 lb

## 2024-07-31 DIAGNOSIS — G4733 Obstructive sleep apnea (adult) (pediatric): Secondary | ICD-10-CM

## 2024-07-31 DIAGNOSIS — D860 Sarcoidosis of lung: Secondary | ICD-10-CM | POA: Diagnosis not present

## 2024-07-31 DIAGNOSIS — J45909 Unspecified asthma, uncomplicated: Secondary | ICD-10-CM

## 2024-07-31 DIAGNOSIS — J4550 Severe persistent asthma, uncomplicated: Secondary | ICD-10-CM | POA: Diagnosis not present

## 2024-07-31 DIAGNOSIS — R519 Headache, unspecified: Secondary | ICD-10-CM

## 2024-07-31 MED ORDER — ALBUTEROL SULFATE HFA 108 (90 BASE) MCG/ACT IN AERS: 2.0000 | INHALATION_SPRAY | Freq: Four times a day (QID) | RESPIRATORY_TRACT | 2 refills | Status: AC | PRN

## 2024-07-31 MED ORDER — FLUTICASONE-SALMETEROL 500-50 MCG/ACT IN AEPB: INHALATION_SPRAY | RESPIRATORY_TRACT | 3 refills | Status: AC

## 2024-07-31 MED ORDER — INCRUSE ELLIPTA 62.5 MCG/ACT IN AEPB: 1.0000 | INHALATION_SPRAY | Freq: Every day | RESPIRATORY_TRACT | 2 refills | Status: AC

## 2024-07-31 NOTE — Patient Instructions (Addendum)
  Severe persistent asthma - stable symptoms Headaches - new  - Reviewed PFTs, improved restriction - HOLD Incruse for one week to see if headache improve. Also ONE puff ONCE a day - CONTINUE Wixela Diskus 500-50 ONE puff TWICE a day.  - CONTINUE Albuterol  TWO puffs as needed for shortness of breath or wheezing  Please have ophthalmology fax their office note to [phone]. Attn: Dr. Slater Staff

## 2024-07-31 NOTE — Progress Notes (Unsigned)
 Subjective:   PATIENT ID: Suzanne Conner, MRN: [id number]   HPI  Chief Complaint  Patient presents with   Asthma    Reason for Visit: Follow-up  Ms. Suzanne Conner is a 78 year old female never smoker with childhood asthma, diabetes (diet controlled), hypertension and GERD who presents for follow-up  Synopsis:  She was previously seen by a Pulmonary doctor in [place]  22 years ago for sarcoid diagnosed via bronchoscopy. She is unable to recall any prolonged treatment. Has not had any respiratory issues since then. Was only seen for ~1 year and reports CXR improved during that time which is suggestive Lofgren syndrome. Denies skin involvement except for seborrheic keratosis. Sees an ophthalmologist for high optic pressures.   On initial consultation had shortness of breath and wheezing x 2 months. She lives in a two story home and now having difficulty walking up.   2022 - Improved symptoms on medium dose Advair  however still not much stamina since the pandemic. Completed Pulm Rehab 2023 - Persistent shortness of breath. Reported frequent nighttime awakenings and am headaches. Dx with mild OSA and started on CPAP. Improved asthma symptoms on Wixela 500  11/02/22 Since our last visit she is on high dose Advair . She is using albuterol  1-2 times month. She continues to have shortness of breath with moderate exertion. Denies wheezing or coughing. Compliant with CPAP nightly, unsure of improved dypsnea but does have improved quality of sleep. No longer have nocturnal awakenings. Has used mouth tape while on her nasal prongs.  08/12/23 Since our last visit she had covid in May and treated with paxlovid . She uses her Wixela twice a day a majority of the week with 2-3 days once daily. Denies cough, sputum production, wheezing. Does have some shortness of breath. Has not been able to use her CPAP due to her recent hospitalizations and has let it lapse. Previously had  good quality sleep when she was on the machine.  12/31/23 Since our last visit she has stopped using CPAP. She is working on losing weight (40 lbs in the last year and 10 lbs in the last 3 months) and resting better. She is compliant Wixela and Incruse however only taking Wixela once a day. She is having shortness of breath with exertion/exercise. Able to ambulate in store and within home. No limitations. No asthma exacerbations. Denies nocturnal symptoms.  07/31/24 Since our last visit she reports respiratory symptoms are stable. Denies shortness of breath, cough or wheezing. Compliant with Wixela and Incruse. No limitation in activity. Rarely uses rescue inhaler. May be having headaches with Incruse. Unclear if she has had associated eye pain but reports some floaters and blurriness that is chronic. Scheduled with ophthalmologist next month   Asthma Control Test ACT Total Score  08/12/2023  2:41 PM 18  02/22/2022 10:40 AM 17  01/04/2021  2:14 PM 16   Review of Systems  Constitutional:  Negative for chills, diaphoresis, fever, malaise/fatigue and weight loss.  HENT:  Negative for congestion.   Respiratory:  Positive for shortness of breath. Negative for cough, hemoptysis, sputum production and wheezing.   Cardiovascular:  Negative for chest pain, palpitations and leg swelling.   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 Husbands health worsening No pets  Past Medical History:  Diagnosis Date   Arthritis    left hip; right wrist (06/12/2018)   Asthma    Depression    Family history of polyps in the colon    GERD (gastroesophageal  reflux disease)    Glaucoma    Hypertension    Paroxysmal SVT (supraventricular tachycardia) 03/23/2022   2 Supraventricular Tachycardia runs occurred, the run with the fastest interval lasting 19 beats with a max rate of 218 bpm    Type 2 diabetes, diet controlled (HCC)      Allergies  Allergen Reactions   Paxil  [Paroxetine ]     Paxil  20 mg  caused a tremor.   Penicillins Hives and Other (See Comments)    PATIENT HAS HAD A PCN REACTION WITH IMMEDIATE RASH, FACIAL/TONGUE/THROAT SWELLING, SOB, OR LIGHTHEADEDNESS WITH HYPOTENSION:  #  #  YES  #  #  INCLUDING HIVES AS A CHILD Has patient had a PCN reaction causing severe rash involving mucus membranes or skin necrosis: No Has patient had a PCN reaction that required hospitalization: No Has patient had a PCN reaction occurring within the last 10 years: No If all of the above answers are NO, then may proceed with Cephalosporin use. PATIENT HAS HAD A PCN REACTION WITH IMMEDIATE RASH, FACIAL/TONGUE/THROAT SWELLING, SOB, OR LIGHTHEADEDNESS WITH HYPOTENSION:  #  #  YES  #  #  INCLUDING HIVES AS A CHILD Has patient had a PCN reaction causing severe rash involving mucus membranes or skin necrosis: No Has patient had a PCN reaction that required hospitalization: No Has patient had a PCN reaction occurring within the last 10 years: No If all of the above answers are NO, then may proceed with Cephalosporin use.    Atorvastatin      Joint pain on 40mg    Simvastatin      Joint pain on 20mg  daily     Outpatient Medications Prior to Visit  Medication Sig Dispense Refill   albuterol  (VENTOLIN  HFA) 108 (90 Base) MCG/ACT inhaler Inhale 2 puffs into the lungs every 6 (six) hours as needed for wheezing or shortness of breath. 8 g 2   aspirin  EC 81 MG tablet Take 81 mg by mouth daily.     Cholecalciferol (VITAMIN D ) 2000 units CAPS Take 2,000 Units by mouth daily.     empagliflozin  (JARDIANCE ) 10 MG TABS tablet Take 1 tablet (10 mg total) by mouth daily before breakfast. 90 tablet 2   fluticasone -salmeterol (WIXELA INHUB) 500-50 MCG/ACT AEPB USE 1 INHALATION IN THE MORNING AND AT BEDTIME 180 each 3   latanoprost (XALATAN) 0.005 % ophthalmic solution Place 1 drop into both eyes at bedtime.     Melatonin 10 MG TABS Take 1 tablet by mouth at bedtime as needed.     Multiple Vitamins-Minerals (CENTRUM  SILVER PO) Take 1 tablet by mouth daily.      nebivolol  (BYSTOLIC ) 5 MG tablet TAKE 1 TABLET AT BEDTIME (CHANGED TIME OF DAY) 90 tablet 3   omeprazole  (PRILOSEC) 40 MG capsule Take 1 capsule (40 mg total) by mouth 2 (two) times daily. 180 capsule 3   ondansetron  (ZOFRAN -ODT) 4 MG disintegrating tablet Take 1 tablet (4 mg total) by mouth every 8 (eight) hours as needed for nausea or vomiting. 20 tablet 0   Probiotic Product (TRUNATURE DIGESTIVE PROBIOTIC) CAPS Take 1 capsule by mouth daily.      rosuvastatin  (CRESTOR ) 5 MG tablet TAKE 1 TABLET DAILY 90 tablet 2   telmisartan  (MICARDIS ) 40 MG tablet TAKE 1 TABLET DAILY 90 tablet 3   tirzepatide  (MOUNJARO ) 12.5 MG/0.5ML Pen Inject 12.5 mg into the skin once a week. 6 mL 0   umeclidinium bromide  (INCRUSE ELLIPTA ) 62.5 MCG/ACT AEPB Inhale 1 puff into the lungs daily. 30  each 2   No facility-administered medications prior to visit.    Objective:   Vitals:   07/31/24 1056  BP: 136/61  Pulse: 79  SpO2: 99%  Weight: 151 lb 9.6 oz (68.8 kg)  Height: 5' 2 (1.575 m)   SpO2: 99 %  Physical Exam: General: Well-appearing, no acute distress HENT: [place], AT Eyes: EOMI, no scleral icterus Respiratory: Clear to auscultation bilaterally.  No crackles, wheezing or rales Cardiovascular: RRR, -M/R/G, no JVD Extremities:-Edema,-tenderness Neuro: AAO x4, CNII-XII grossly intact Psych: Normal mood, normal affect   Data Reviewed:  Imaging: CXR 07/12/2020-no infiltrate, effusion or edema. CT Coronary 03/20/22 - Visualized lung parenchyma with no pulmonary nodules, masses, infiltrate, effusion or pneumothorax. Large hiatal hernia  PFT: 01/04/21 FVC 1.49 (62%) FEV1 1.10 (59%) Ratio 73  TLC 63% DLCO 62% Interpretation: Mixed obstructive and restrictive defect with mildly reduced DLCO. No significant bronchodilator response however does not preclude benefit of bronchodilators  05/04/22 FVC 1.36(45%) FEV1 1.06(45%) Ratio 74  TLC 63% DLCO  58% Interpretation: Mixed obstructive and restrictive defect with mildly reduced DLCO. Significant FEV1 bronchodilator response. Compared to 2022, reduced FEV1.  08/12/23 FVC 1.68 (66%) FEV1 1.28 (67%) Ratio 71  TLC 71% DLCO 75% Interpretation: Moderate restrictive defect with mildly reduced DLCO. Partial bronchodilator response.   --Improved obstruction and restriction and DLCO  07/16/24 FVC 1.73 (68%) FEV1 1.37 (72%) Ratio 79  TLC 78% DLCO 77% Interpretation: Mild restrictive defect with mildly reduced DLCO.   Labs: CBC    Component Value Date/Time   WBC 7.4 03/09/2024 0838   WBC 6.7 04/19/2023 1134   RBC 4.57 03/09/2024 0838   RBC 4.78 04/19/2023 1134   HGB 12.8 03/09/2024 0838   HCT 41.2 03/09/2024 0838   PLT 290 03/09/2024 0838   MCV 90 03/09/2024 0838   MCH 28.0 03/09/2024 0838   MCH 27.7 03/18/2020 1135   MCHC 31.1 (L) 03/09/2024 0838   MCHC 32.0 04/19/2023 1134   RDW 12.8 03/09/2024 0838   LYMPHSABS 2.1 04/19/2023 1134   MONOABS 0.5 04/19/2023 1134   EOSABS 0.0 04/19/2023 1134   BASOSABS 0.0 04/19/2023 1134   Absolute eos  03/18/20-70 05/11/21 - 100  Sleep: HST 04/19/22 - Mild OSA. AHI 6.7. Nadir SpO2 77%. Mean SpO2 92%  CPAP compliance 10/03/22 - 11/01/22 >4 hours 30/30 days <4 hours 29 days (97%)    Assessment & Plan:   Discussion: 78 year old female never smoker with severe asthma, pulmonary sarcoid/Lofgren's, OSA, DM2 who presents for follow-up. Well controlled on current bronchodilators. Improved dyspnea since losing weight. Compliant with bronchodilators but not taking Wixela twice a day. Counseled on taking meds as directed.   78 year old female never smoker with sever asthma, pulmonary sarcoid/Lofgren's, OSA, DM2 who presents for follow-up for asthma and sarcoid.   Mild OSA - nonadherent --Encourage restarting to help with shortness of breath if weight gain --Counseled NOT to drive if/when sleepy --Has lost 40 lbs in 1 year. Consider re-evaluation with  home sleep study in the future  Severe persistent asthma - stable symptoms - Reviewed PFTs, improved restriction - HOLD Incruse for one week to see if headache improve. Also ONE puff ONCE a day - CONTINUE Wixela Diskus 500-50 ONE puff TWICE a day.  - CONTINUE Albuterol  TWO puffs as needed for shortness of breath or wheezing   Asthma action plan Use albuterol  for worsening shortness of breath, wheezing and cough. If you symptoms do not improve in 24-48 hours, please our office for evaluation  and/or prednisone  taper.  Headaches - new May be related to incruse/eye issues - Hold Incruse and if symptoms resolve, continue to hold   Pulmonary sarcoidosis - Dx in 1999 via endobronchial bx. No records available - Hx of resolution in <1 year suggestive of Lofgren - No indication for prednisone  therapy for now - Annual PFTs. Due 07/2025. Reviewed PFTs with improved restriction and DLCO - Recommend annual ophthalmology exam. [facility] 02/22/22 - no sarcoid involvement - Reviewed prior EKG. Prolonged P-R interval.     Health Maintenance Immunization History  Administered Date(s) Administered   Fluad Quad(high Dose 65+) 06/22/2020, 07/06/2022   INFLUENZA, HIGH DOSE SEASONAL PF 05/12/2019, 06/10/2021   Influenza-Unspecified 05/29/2018, 05/12/2019, 06/12/2024   Moderna Covid-19 Fall Seasonal Vaccine 51yrs & older 05/29/2022   Moderna Covid-19 Vaccine Bivalent Booster 46yrs & up 05/13/2021   PFIZER Comirnaty(Gray Top)Covid-19 Tri-Sucrose Vaccine 12/08/2020   PFIZER(Purple Top)SARS-COV-2 Vaccination 10/01/2019, 10/22/2019, 06/10/2020, 06/17/2020, 12/08/2020   PNEUMOCOCCAL CONJUGATE-20 07/16/2022   Pfizer Covid-19 Vaccine Bivalent Booster 39yrs & up 07/06/2022   Pfizer(Comirnaty)Fall Seasonal Vaccine 12 years and older 06/01/2022   Pneumococcal Polysaccharide-23 06/13/2018   Pneumococcal-Unspecified 07/06/2022   RSV,unspecified 07/06/2022   Unspecified SARS-COV-2 Vaccination  05/11/2024   Zoster Recombinant(Shingrix) 03/10/2019, 05/12/2019   Zoster, Live 04/27/2008   CT Lung Screen - not indicated  No orders of the defined types were placed in this encounter.  Meds ordered this encounter  Medications   albuterol  (VENTOLIN  HFA) 108 (90 Base) MCG/ACT inhaler    Sig: Inhale 2 puffs into the lungs every 6 (six) hours as needed for wheezing or shortness of breath.    Dispense:  54 g    Refill:  2   fluticasone -salmeterol (WIXELA INHUB) 500-50 MCG/ACT AEPB    Sig: USE 1 INHALATION IN THE MORNING AND AT BEDTIME    Dispense:  180 each    Refill:  3   umeclidinium bromide  (INCRUSE ELLIPTA ) 62.5 MCG/ACT AEPB    Sig: Inhale 1 puff into the lungs daily.    Dispense:  90 each    Refill:  2    Return in about 6 months (around 01/28/2025).   I have spent a total time of 31-minutes on the day of the appointment including chart review, data review, collecting history, coordinating care and discussing medical diagnosis and plan with the patient/family. Past medical history, allergies, medications were reviewed. Pertinent imaging, labs and tests included in this note have been reviewed and interpreted independently by me.  Farren Landa Slater Staff, MD [facility] 07/31/2024

## 2024-08-10 NOTE — Progress Notes (Signed)
 Cardiology Office Note:   Date:  08/17/2024  ID:  Suzanne Conner, DOB Jan 12, 1946, MRN [id number] PCP:  Mercer Clotilda SAUNDERS, MD  [facility] Providers Cardiologist:  Wendel Haws, MD Referring MD: Mercer Clotilda SAUNDERS, MD  Chief Complaint/Reason for Referral: Cardiology follow-up ASSESSMENT:    1. Palpitations   2. Coronary artery disease involving native coronary artery of native heart without angina pectoris   3. Type 2 diabetes mellitus without complication, without long-term current use of insulin  (HCC)   4. Hypertension associated with diabetes (HCC)   5. Hyperlipidemia associated with type 2 diabetes mellitus (HCC)   6. Pulmonary sarcoidosis   7. BMI 35.0-35.9,adult   8. Medication management      PLAN:   In order of problems listed above: Palpitations: Reassuring monitor recently.  Continue Bystolic  5 mg at bedtime. Coronary artery disease: Mild.  Continue aspirin  81 mg, rosuvastatin  5 mg T2DM: Continue aspirin  81 mg, Jardiance  10 mg, rosuvastatin  5 mg, telmisartan  40 mg.  Blood pressure well-controlled today Hypertension: Continue Nebivolol  5 mg, telmisartan  40 mg.   Hyperlipidemia: Continue rosuvastatin  5 mg.  Check lipid panel and LFTs today. Pulmonary sarcoidosis: CMR demonstrated no cardiac involvement.  Followed by pulmonology. Elevated BMI: Continue Mounjaro  12.5 mg q. weekly            Dispo:  Return in about 1 year (around 08/17/2025).      Medication Adjustments/Labs and Tests Ordered: Current medicines are reviewed at length with the patient today.  Concerns regarding medicines are outlined above.  The following changes have been made:  no change   Labs/tests ordered: Orders Placed This Encounter  Procedures   Lipid Profile   Hepatic function panel   EKG 12-Lead    Medication Changes: Meds ordered this encounter  Medications   empagliflozin  (JARDIANCE ) 10 MG TABS tablet    Sig: Take 1 tablet (10 mg total) by mouth daily before breakfast.     Dispense:  90 tablet    Refill:  3   nebivolol  (BYSTOLIC ) 5 MG tablet    Sig: Take 1 tablet (5 mg total) by mouth daily.    Dispense:  90 tablet    Refill:  3   rosuvastatin  (CRESTOR ) 5 MG tablet    Sig: Take 1 tablet (5 mg total) by mouth daily.    Dispense:  90 tablet    Refill:  3   telmisartan  (MICARDIS ) 40 MG tablet    Sig: Take 1 tablet (40 mg total) by mouth daily.    Dispense:  90 tablet    Refill:  3    Current medicines are reviewed at length with the patient today.  The patient does not have concerns regarding medicines.  I spent 37 minutes reviewing all clinical data during and prior to this visit including all relevant imaging studies, laboratories, clinical information from other health systems and prior notes from both Cardiology and other specialties, interviewing the patient, conducting a complete physical examination, and coordinating care in order to formulate a comprehensive and personalized evaluation and treatment plan.   History of Present Illness:      FOCUSED PROBLEM LIST:   Coronary artery disease TTE 08/16/21: EF 60-65, no RWMA, GR 1 DD, normal RVSF, trivial MR, trivial AI, AV sclerosis without stenosis  TTE 04/22/24: EF 60 to 65%, trivial MR, aortic sclerosis Mild non-obs CAD on CCTA in 03/2022 (pLAD 25-49, LM and pRCA 0-24); CAC score 89 (70th percentile) Supraventricular Tachycardia Monitor 03/2022: NSR, average heart rate 85; 2  SVT runs (19 beats); no A-fib, sustained arrhythmias or bradycardia arrhythmias  Monitor 04/2024: 2 episodes of SVT, rare PACs and PVCs Pulmonary sarcoidosis CMR 09/17/23: EF 58, no evidence of cardiac sarcoid Pulm: Dr. Kassie  Asthma  Hypertension  Hyperlipidemia  LP(a) 21.9 Intol to Atorva 2/2 myalgias  T2DM Not on insulin  BMI 35 OSA  GERD  Depression  Glaucoma    Hiatal hernia        November 2022 consultation:   CHANDRIKA SANDLES is a 77 y.o. female with the indicated history patient was seen in October by her  primary care provider.  At that time she was endorsing shortness of breath but no exertional chest pain.  Her blood pressure was mildly elevated above goal.  Her metoprolol  was stopped because this was thought to be contributing to her symptoms possibly.  She was started on telmisartan .   She tells me that she has been more short of breath over the last several months.  This typically happens happens with exertion.  She has gained about approximately 20 pounds since the pandemic started.  She has noticed this shortness of breath happening near the end of the pandemic.  She has also had an exacerbation of her childhood asthma.  She is now on medication for this.  She occasionally wheezes but this is pretty rare.  This shortness of breath she is endorsing today is separate from her wheezing.  She denies any significant edema, paroxysmal nocturnal dyspnea, orthopnea, exertional angina, presyncope or syncope.  She has very rare palpitations that last seconds and may be happening once or twice a week.  They are not not associated with lightheadedness, shortness of breath, or chest pain.  She has required no emergency room visits or hospitalizations.  Plan: Echocardiogram, start Jardiance , start atorvastatin  40 mg daily and empiric lasix .   February 2023:   The patient is a 78 y.o. female with the indicated medical history here for follow-up.  I first saw her in November 2022.  She was complaining of dyspnea.  We started empiric Lasix  which helped her breathing.  She was started on Jardiance  and atorvastatin  as well.   Her breathing is mildly improved with Lasix .  She still feels short of breath with exertion at times.  She denies any chest pain, palpitations, paroxysmal nocturnal dyspnea, orthopnea.  She has required no emergency room visits or hospitalizations.  In terms of her weight she is a stress eater and tells me that her weight is gone up and down.  When reviewing the record she is gained about 10 pounds  in the last few years.  Plan: Change atorvastatin  to simvastatin  and increase Micardis  to 40 mg; stop hydrochlorothiazide ; refer to pharmacy for elevated BMI   March 2024:  In the interim since I saw her last the patient had a monitor placed which demonstrated rare asymptomatic SVT and no atrial fibrillation.  She was placed on Bystolic .  She is seen pulmonology as well and was started on inhalers.  When she was seen in September a BNP was checked and found to be normal.  It was thought her dyspnea was from a pulmonary component.  She was seen by pulmonology recently was thought to have severe persistent asthma, pulmonary sarcoidosis, and mild obstructive sleep apnea.  The patient continues to struggle with shortness of breath.  She had her asthma medications intensified which has helped.  She has noticed that occasionally when she gets short of breath she does wheeze.  Unfortunately is  continues to limit her from losing weight.  She fortunately has not required any emergency room visits or hospitalizations.  She has developed some headaches in response to nebivolol  which was started because of occasional SVT.  She has noticed no palpitations since starting this medication.  Plan: Obtain CMR to evaluate for cardiac sarcoidosis, change Bystolic  to q. bedtime dosing, check lipid panel, LFTs, LP(a); refer to pharmacy for GLP-1 receptor agonist therapy.  January 2025:  The patient's LDL was at goal and her LP(a) was not elevated.  Her CMR demonstrated no evidence of sarcoidosis.  The patient has lost about 30 pounds with Mounjaro .  She feels much better.  Breathing is much improved.  She sometimes gets short of breath and people tell her she is wheezing.  When she takes her rescue inhaler she will feel much better.  She denies any cardiovascular complaints.  She has been compliant with her medications.  She typically checks her blood pressures and they are usually in the 130s under 80s.  She fortunately has not  required any emergency room visits or hospitalizations.  She is otherwise well and without significant complaints.  Plan: Continue medical therapy.  December 2025:  Patient consents to use of AI scribe. The patient returns for routine follow-up.  She was last seen in June of this year.  She reported palpitations in the monitor and echocardiogram were ordered.  TSH, BMP, and CBC were within normal limits.  Her monitor demonstrated rare SVT, PACs, and PVCs.  Her echocardiogram was also reassuring.  Her blood pressure was elevated during today's visit, though she notes it was normal at home this morning. She attributes the elevated reading to anxiety related to driving and the anticipation of the visit. She regularly monitors her blood pressure at home, which is usually less than 130/80 mmHg.  She has been on Mounjaro , resulting in significant weight loss of about 50 pounds, improved energy levels, and increased physical activity. She reports minimal side effects from the medication.  She is currently taking Jardiance  and questions its necessity given her good A1c levels. She is also on a statin and has concerns about potential long-term effects, such as muscle aches and brain fog, though she has not experienced these symptoms.          Current Medications: Current Meds  Medication Sig   albuterol  (VENTOLIN  HFA) 108 (90 Base) MCG/ACT inhaler Inhale 2 puffs into the lungs every 6 (six) hours as needed for wheezing or shortness of breath.   aspirin  EC 81 MG tablet Take 81 mg by mouth daily.   Cholecalciferol (VITAMIN D ) 2000 units CAPS Take 2,000 Units by mouth daily.   fluticasone -salmeterol (WIXELA INHUB) 500-50 MCG/ACT AEPB USE 1 INHALATION IN THE MORNING AND AT BEDTIME   latanoprost (XALATAN) 0.005 % ophthalmic solution Place 1 drop into both eyes at bedtime.   Melatonin 10 MG TABS Take 1 tablet by mouth at bedtime as needed.   Multiple Vitamins-Minerals (CENTRUM SILVER PO) Take 1 tablet by  mouth daily.    omeprazole  (PRILOSEC) 40 MG capsule Take 1 capsule (40 mg total) by mouth 2 (two) times daily.   ondansetron  (ZOFRAN -ODT) 4 MG disintegrating tablet Take 1 tablet (4 mg total) by mouth every 8 (eight) hours as needed for nausea or vomiting.   Probiotic Product (TRUNATURE DIGESTIVE PROBIOTIC) CAPS Take 1 capsule by mouth daily.    tirzepatide  (MOUNJARO ) 12.5 MG/0.5ML Pen Inject 12.5 mg into the skin once a week.   umeclidinium bromide  (INCRUSE  ELLIPTA) 62.5 MCG/ACT AEPB Inhale 1 puff into the lungs daily.   ZORYVE 0.3 % FOAM Apply topically.   [DISCONTINUED] empagliflozin  (JARDIANCE ) 10 MG TABS tablet Take 1 tablet (10 mg total) by mouth daily before breakfast.   [DISCONTINUED] nebivolol  (BYSTOLIC ) 5 MG tablet TAKE 1 TABLET AT BEDTIME (CHANGED TIME OF DAY)   [DISCONTINUED] rosuvastatin  (CRESTOR ) 5 MG tablet TAKE 1 TABLET DAILY   [DISCONTINUED] telmisartan  (MICARDIS ) 40 MG tablet TAKE 1 TABLET DAILY     Review of Systems:   Please see the history of present illness.    All other systems reviewed and are negative.     EKGs/Labs/Other Test Reviewed:   EKG: 2025 normal sinus rhythm nonspecific ST and T wave changes  EKG Interpretation Date/Time:  Monday August 17 2024 09:54:42 EST Ventricular Rate:  78 PR Interval:  136 QRS Duration:  82 QT Interval:  374 QTC Calculation: 426 R Axis:   71  Text Interpretation: Normal sinus rhythm Normal ECG When compared with ECG of 03-Oct-2023 08:11, No significant change was found Confirmed by Wendel Haws ([id number]) on 08/17/2024 10:00:37 AM         Risk Assessment/Calculations:          Physical Exam:   VS:  BP 120/70   Pulse 78   Ht 5' 3 (1.6 m)   Wt 145 lb (65.8 kg)   SpO2 99%   BMI 25.69 kg/m          Wt Readings from Last 3 Encounters:  08/17/24 145 lb (65.8 kg)  07/31/24 151 lb 9.6 oz (68.8 kg)  07/14/24 146 lb (66.2 kg)      GENERAL:  No apparent distress, AOx3 HEENT:  No carotid bruits, +2 carotid  impulses, no scleral icterus CAR: RRR no murmurs, gallops, rubs, or thrills RES:  Clear to auscultation bilaterally ABD:  Soft, nontender, nondistended, positive bowel sounds x 4 VASC:  +2 radial pulses, +2 carotid pulses NEURO:  CN 2-12 grossly intact; motor and sensory grossly intact PSYCH:  No active depression or anxiety EXT:  No edema, ecchymosis, or cyanosis  Signed, Abhimanyu Cruces K Ciin Brazzel, MD  08/17/2024 10:37 AM    [facility] [address] [place], [place], [place]  [address] Phone: [phone]; Fax: [phone]   Note:  This document was prepared using Dragon voice recognition software and may include unintentional dictation errors.

## 2024-08-12 DIAGNOSIS — H401132 Primary open-angle glaucoma, bilateral, moderate stage: Secondary | ICD-10-CM | POA: Diagnosis not present

## 2024-08-12 DIAGNOSIS — H25812 Combined forms of age-related cataract, left eye: Secondary | ICD-10-CM | POA: Diagnosis not present

## 2024-08-12 DIAGNOSIS — H04123 Dry eye syndrome of bilateral lacrimal glands: Secondary | ICD-10-CM | POA: Diagnosis not present

## 2024-08-12 DIAGNOSIS — H18513 Endothelial corneal dystrophy, bilateral: Secondary | ICD-10-CM | POA: Diagnosis not present

## 2024-08-12 LAB — OPHTHALMOLOGY REPORT-SCANNED

## 2024-08-17 ENCOUNTER — Encounter: Payer: Self-pay | Admitting: Internal Medicine

## 2024-08-17 ENCOUNTER — Ambulatory Visit: Attending: Internal Medicine | Admitting: Internal Medicine

## 2024-08-17 VITALS — BP 120/70 | HR 78 | Ht 63.0 in | Wt 145.0 lb

## 2024-08-17 DIAGNOSIS — D860 Sarcoidosis of lung: Secondary | ICD-10-CM

## 2024-08-17 DIAGNOSIS — R002 Palpitations: Secondary | ICD-10-CM | POA: Diagnosis not present

## 2024-08-17 DIAGNOSIS — E1169 Type 2 diabetes mellitus with other specified complication: Secondary | ICD-10-CM | POA: Diagnosis not present

## 2024-08-17 DIAGNOSIS — I2510 Atherosclerotic heart disease of native coronary artery without angina pectoris: Secondary | ICD-10-CM

## 2024-08-17 DIAGNOSIS — E785 Hyperlipidemia, unspecified: Secondary | ICD-10-CM | POA: Diagnosis not present

## 2024-08-17 DIAGNOSIS — Z6835 Body mass index (BMI) 35.0-35.9, adult: Secondary | ICD-10-CM | POA: Diagnosis not present

## 2024-08-17 DIAGNOSIS — I152 Hypertension secondary to endocrine disorders: Secondary | ICD-10-CM | POA: Diagnosis not present

## 2024-08-17 DIAGNOSIS — E1159 Type 2 diabetes mellitus with other circulatory complications: Secondary | ICD-10-CM

## 2024-08-17 DIAGNOSIS — Z79899 Other long term (current) drug therapy: Secondary | ICD-10-CM

## 2024-08-17 DIAGNOSIS — E119 Type 2 diabetes mellitus without complications: Secondary | ICD-10-CM | POA: Diagnosis not present

## 2024-08-17 MED ORDER — NEBIVOLOL HCL 5 MG PO TABS: 5.0000 mg | ORAL_TABLET | Freq: Every day | ORAL | 3 refills | Status: DC

## 2024-08-17 MED ORDER — TELMISARTAN 40 MG PO TABS: 40.0000 mg | ORAL_TABLET | Freq: Every day | ORAL | 3 refills | Status: AC

## 2024-08-17 MED ORDER — EMPAGLIFLOZIN 10 MG PO TABS: 10.0000 mg | ORAL_TABLET | Freq: Every day | ORAL | 3 refills | Status: AC

## 2024-08-17 MED ORDER — ROSUVASTATIN CALCIUM 5 MG PO TABS: 5.0000 mg | ORAL_TABLET | Freq: Every day | ORAL | 3 refills | Status: AC

## 2024-08-17 NOTE — Patient Instructions (Addendum)
 Medication Instructions:  Your physician recommends that you continue on your current medications as directed. Please refer to the Current Medication list given to you today.  *If you need a refill on your cardiac medications before your next appointment, please call your pharmacy*  Lab Work: Today: Lipid panel & LFTs  If you have any lab test that is abnormal or we need to change your treatment, we will call you to review the results.  Testing/Procedures: None ordered  Follow-Up: At [facility], you and your health needs are our priority.  As part of our continuing mission to provide you with exceptional heart care, our providers are all part of one team.  This team includes your primary Cardiologist (physician) and Advanced Practice Providers or APPs (Physician Assistants and Nurse Practitioners) who all work together to provide you with the care you need, when you need it.  Your next appointment:   1 year(s)  Provider:   Glendia Ferrier, PA      Thank you for choosing [facility]!!   [phone]

## 2024-08-18 ENCOUNTER — Ambulatory Visit: Payer: Self-pay | Admitting: Internal Medicine

## 2024-08-18 ENCOUNTER — Ambulatory Visit: Admitting: Bariatrics

## 2024-08-18 LAB — LIPID PANEL
Chol/HDL Ratio: 2.1 ratio (ref 0.0–4.4)
Cholesterol, Total: 140 mg/dL (ref 100–199)
HDL: 67 mg/dL (ref >39)
LDL Chol Calc (NIH): 61 mg/dL (ref 0–99)
Triglycerides: 59 mg/dL (ref 0–149)
VLDL Cholesterol Cal: 12 mg/dL (ref 5–40)

## 2024-08-18 LAB — HEPATIC FUNCTION PANEL
ALT: 22 IU/L (ref 0–32)
AST: 23 IU/L (ref 0–40)
Albumin: 4.2 g/dL (ref 3.8–4.8)
Alkaline Phosphatase: 71 IU/L (ref 49–135)
Bilirubin Total: 0.4 mg/dL (ref 0.0–1.2)
Bilirubin, Direct: 0.17 mg/dL (ref 0.00–0.40)
Total Protein: 6.8 g/dL (ref 6.0–8.5)

## 2024-08-19 ENCOUNTER — Ambulatory Visit: Admitting: Bariatrics

## 2024-08-24 ENCOUNTER — Ambulatory Visit: Admitting: Bariatrics

## 2024-08-24 ENCOUNTER — Encounter: Payer: Self-pay | Admitting: Bariatrics

## 2024-08-24 VITALS — BP 138/78 | HR 84 | Ht 62.0 in | Wt 145.0 lb

## 2024-08-24 DIAGNOSIS — E118 Type 2 diabetes mellitus with unspecified complications: Secondary | ICD-10-CM

## 2024-08-24 DIAGNOSIS — E119 Type 2 diabetes mellitus without complications: Secondary | ICD-10-CM | POA: Diagnosis not present

## 2024-08-24 DIAGNOSIS — Z7985 Long-term (current) use of injectable non-insulin antidiabetic drugs: Secondary | ICD-10-CM | POA: Diagnosis not present

## 2024-08-24 DIAGNOSIS — Z6826 Body mass index (BMI) 26.0-26.9, adult: Secondary | ICD-10-CM

## 2024-08-24 DIAGNOSIS — E669 Obesity, unspecified: Secondary | ICD-10-CM | POA: Diagnosis not present

## 2024-08-24 NOTE — Progress Notes (Signed)
 WEIGHT SUMMARY AND BIOMETRICS  Weight Lost Since Last Visit: 1lb  Weight Gained Since Last Visit: 0   Vitals BP: 138/78 Pulse Rate: 84 SpO2: 100 %   Anthropometric Measurements Height: 5' 2 (1.575 m) Weight: 145 lb (65.8 kg) BMI (Calculated): 26.51 Weight at Last Visit: 146lb Weight Lost Since Last Visit: 1lb Weight Gained Since Last Visit: 0 Starting Weight: 197lb Total Weight Loss (lbs): 52 lb (23.6 kg)   Body Composition  Body Fat %: 39.7 % Fat Mass (lbs): 57.6 lbs Muscle Mass (lbs): 83.2 lbs Total Body Water (lbs): 57.8 lbs Visceral Fat Rating : 11   Other Clinical Data Fasting: no Labs: no Today's Visit #: 28 Starting Date: 03/22/22    OBESITY Suzanne Conner is here to discuss her progress with her obesity treatment plan along with follow-up of her obesity related diagnoses.    Nutrition Plan: the Category 1 plan - 75% adherence.  Current exercise: walking  Interim History:  She is down 1 lb since her last visit and has done very well overall.  She states that she has been to multiple celebrations and did post Thanksgiving which did set up some situations where she ate more.  She is working on maintenance at this time. Eating all of the food on the plan., Protein intake is as prescribed, Is not skipping meals, and Water intake is adequate.   Pharmacotherapy: Suzanne Conner is on Mounjaro  12.5 mg SQ weekly Adverse side effects: None Hunger is moderately controlled.  Cravings are moderately controlled.  Assessment/Plan:   Type II Diabetes HgbA1c is not at goal. Last A1c was 4.9 CBGs: Not checking      Episodes of hypoglycemia: no Medication(s): Mounjaro  12.5 mg SQ weekly  Lab Results  Component Value Date   HGBA1C 4.9 12/11/2023   HGBA1C 5.5 04/19/2023   HGBA1C 5.4 09/21/2022   Lab Results  Component Value Date   LDLCALC 61 08/17/2024    CREATININE 0.72 03/09/2024   Lab Results  Component Value Date   GFR 84.42 04/19/2023   GFR 87.12 05/11/2021   GFR 84.48 10/05/2020    Plan: Continue Mounjaro  at the current dose.  Will keep all carbohydrates low both sweets and starches.  Will continue exercise regimen to 30 to 60 minutes on most days of the week.  She will continue to exercise and use her weights to help with muscle maintenance. Eat more low glycemic index foods.    Generalized Obesity: Current BMI BMI (Calculated): 26.51   Pharmacotherapy Plan Continue  Mounjaro  12.5 mg SQ weekly  Suzanne Conner is currently in the action stage of change. As such, her goal is to maintain weight for now.  She has agreed to the Category 1 plan.  Exercise goals: Older adults should determine their level of effort for physical activity relative to their level of fitness.  Will do her weights periodically.  Behavioral modification strategies: increasing lean protein intake, no meal skipping, meal planning , increase water intake, better snacking choices, planning for success, increasing vegetables, avoiding temptations, keep healthy foods in the home, weigh protein portions, measure portion sizes, and mindful eating.  Suzanne Conner has agreed to follow-up with our clinic in 4 weeks.    Objective:   VITALS: Per patient if applicable, see vitals. GENERAL: Alert and in no acute distress. CARDIOPULMONARY: No increased WOB. Speaking in clear sentences.  PSYCH: Pleasant and cooperative. Speech normal rate and rhythm. Affect is appropriate. Insight and judgement are appropriate. Attention is focused, linear, and appropriate.  NEURO: Oriented as arrived to appointment on time with no prompting.   Attestation Statements:   This was prepared with the assistance of Engineer, Civil (consulting).  Occasional wrong-word or sound-a-like substitutions may have occurred due to the inherent limitations of voice recognition   Clayborne Daring, DO

## 2024-09-08 ENCOUNTER — Encounter: Payer: Self-pay | Admitting: Podiatry

## 2024-09-08 ENCOUNTER — Ambulatory Visit (INDEPENDENT_AMBULATORY_CARE_PROVIDER_SITE_OTHER): Admitting: Podiatry

## 2024-09-08 DIAGNOSIS — B351 Tinea unguium: Secondary | ICD-10-CM | POA: Diagnosis not present

## 2024-09-08 DIAGNOSIS — M79674 Pain in right toe(s): Secondary | ICD-10-CM

## 2024-09-08 DIAGNOSIS — L84 Corns and callosities: Secondary | ICD-10-CM | POA: Diagnosis not present

## 2024-09-08 DIAGNOSIS — M79675 Pain in left toe(s): Secondary | ICD-10-CM | POA: Diagnosis not present

## 2024-09-08 DIAGNOSIS — E1142 Type 2 diabetes mellitus with diabetic polyneuropathy: Secondary | ICD-10-CM | POA: Diagnosis not present

## 2024-09-08 NOTE — Progress Notes (Signed)
"  °  Subjective:  Patient ID: Suzanne Conner, female    DOB: 05-01-1946,   MRN: [id number]  Chief Complaint  Patient presents with   Callouses    Rm22 Patient complains of callouses bilateral feet/ left foot callous lateral side of foot that hurts the most/ right foot callous 2nd toe and 4th toe/ Diabetic A1c 5    78 y.o. female presents for concern of calluses on both feet that are painful. Also  concern of thickened elongated and painful nails that are difficult to trim. Requesting to have them trimmed today.  Denies  burning and tingling in their feet. Patient is diabetic and last A1c was  Lab Results  Component Value Date   HGBA1C 4.9 12/11/2023   .   PCP:  Mercer Clotilda SAUNDERS, MD    . Denies any other pedal complaints. Denies n/v/f/c.   Past Medical History:  Diagnosis Date   Arthritis    left hip; right wrist (06/12/2018)   Asthma    Depression    Family history of polyps in the colon    GERD (gastroesophageal reflux disease)    Glaucoma    Hypertension    Paroxysmal SVT (supraventricular tachycardia) 03/23/2022   2 Supraventricular Tachycardia runs occurred, the run with the fastest interval lasting 19 beats with a max rate of 218 bpm    Type 2 diabetes, diet controlled (HCC)     Objective:  Physical Exam: Vascular: DP/PT pulses 2/4 bilateral. CFT <3 seconds. Feet cold to touch. Absent hair growth on digits. Edema noted to bilateral lower extremities. Xerosis noted bilaterally.  Skin. No lacerations or abrasions bilateral feet. Nails 1-5 bilateral  are thickened discolored and elongated with subungual debris.  Hyperkeratotic lesion noted to base of plantar left fifth metatarsal area. Musculoskeletal: MMT 5/5 bilateral lower extremities in DF, PF, Inversion and Eversion. Deceased ROM in DF of ankle joint.  Neurological: Sensation intact to light touch. Protective sensation intact bilateral.    Assessment:   1. Type 2 diabetes mellitus with peripheral neuropathy (HCC)   2.  Pain due to onychomycosis of toenails of both feet   3. Callus      Plan:  Patient was evaluated and treated and all questions answered. -Discussed and educated patient on diabetic foot care, especially with  regards to the vascular, neurological and musculoskeletal systems.  -Stressed the importance of good glycemic control and the detriment of not  controlling glucose levels in relation to the foot. -Discussed supportive shoes at all times and checking feet regularly.  -Mechanically debrided all nails 1-5 bilateral using sterile nail nipper and filed with dremel without incident  -Hyperkeratotic lesion to plantar left lateral foot debrided without incident as courtesy today.  Discussed prevention techniques for this area. -Answered all patient questions -Patient to return  in 1 year for diabetic foot check -Patient advised to call the office if any problems or questions arise in the meantime.    Asberry Failing, DPM    "

## 2024-09-22 ENCOUNTER — Ambulatory Visit: Admitting: Bariatrics

## 2024-09-30 ENCOUNTER — Encounter: Payer: Self-pay | Admitting: Bariatrics

## 2024-09-30 ENCOUNTER — Ambulatory Visit: Admitting: Bariatrics

## 2024-09-30 VITALS — BP 158/77 | HR 77 | Ht 62.0 in | Wt 143.0 lb

## 2024-09-30 DIAGNOSIS — I10 Essential (primary) hypertension: Secondary | ICD-10-CM

## 2024-09-30 DIAGNOSIS — E118 Type 2 diabetes mellitus with unspecified complications: Secondary | ICD-10-CM

## 2024-09-30 DIAGNOSIS — Z7985 Long-term (current) use of injectable non-insulin antidiabetic drugs: Secondary | ICD-10-CM | POA: Diagnosis not present

## 2024-09-30 DIAGNOSIS — E669 Obesity, unspecified: Secondary | ICD-10-CM

## 2024-09-30 DIAGNOSIS — E119 Type 2 diabetes mellitus without complications: Secondary | ICD-10-CM | POA: Diagnosis not present

## 2024-09-30 DIAGNOSIS — Z6826 Body mass index (BMI) 26.0-26.9, adult: Secondary | ICD-10-CM | POA: Diagnosis not present

## 2024-09-30 NOTE — Progress Notes (Signed)
 "                                                                                                             WEIGHT SUMMARY AND BIOMETRICS  Weight Lost Since Last Visit: 2lb  Weight Gained Since Last Visit: 0   Vitals BP: (!) 158/77 Pulse Rate: 77 SpO2: 100 %   Anthropometric Measurements Height: 5' 2 (1.575 m) Weight: 143 lb (64.9 kg) BMI (Calculated): 26.15 Weight at Last Visit: 145lb Weight Lost Since Last Visit: 2lb Weight Gained Since Last Visit: 0 Starting Weight: 197lb Total Weight Loss (lbs): 54 lb (24.5 kg)   Body Composition  Body Fat %: 39.6 % Fat Mass (lbs): 57 lbs Muscle Mass (lbs): 82.4 lbs Total Body Water (lbs): 56.2 lbs Visceral Fat Rating : 11   Other Clinical Data Fasting: no Labs: no Today's Visit #: 53 Starting Date: 03/22/22    OBESITY Suzanne Conner is here to discuss her progress with her obesity treatment plan along with follow-up of her obesity related diagnoses.    Nutrition Plan: the Category 1 plan - 60% adherence.  Current exercise: walking  Interim History:  She is down another 2 lbs since her last visit.  Eating all of the food on the plan., Protein intake is as prescribed, Is not skipping meals, and Water intake is adequate.   Pharmacotherapy: Suzanne Conner is on Mounjaro  12.5 mg SQ weekly Adverse side effects: None Hunger is moderately controlled.  Cravings are moderately controlled.  Assessment/Plan:   Type II Diabetes HgbA1c is at goal. Last A1c was 4.9 CBGs: Fasting 80's      Episodes of hypoglycemia: no Medication(s): Mounjaro  12.5 mg SQ weekly  Lab Results  Component Value Date   HGBA1C 4.9 12/11/2023   HGBA1C 5.5 04/19/2023   HGBA1C 5.4 09/21/2022   Lab Results  Component Value Date   LDLCALC 61 08/17/2024   CREATININE 0.72 03/09/2024   Lab Results  Component Value Date   GFR 84.42 04/19/2023   GFR 87.12 05/11/2021   GFR 84.48 10/05/2020    Plan: Continue Mounjaro  12.5 mg SQ weekly Continue all  other medications.  Will keep all carbohydrates low both sweets and starches.  Will continue exercise regimen to 30 to 60 minutes on most days of the week.  Aim for 7 to 9 hours of sleep nightly.  Eat more low glycemic index foods.   Hypertension Hypertension control uncertain.  Medication(s): Bystolic  5 mg and Micardis  40 mg She did not take her medication today.  BP Readings from Last 3 Encounters:  09/30/24 (!) 158/77  08/24/24 138/78  08/17/24 120/70   Lab Results  Component Value Date   CREATININE 0.72 03/09/2024   CREATININE 0.70 04/19/2023   CREATININE 0.69 02/20/2022   Lab Results  Component Value Date   GFR 84.42 04/19/2023   GFR 87.12 05/11/2021   GFR 84.48 10/05/2020    Plan: Continue all antihypertensives at current dosages. She will take her blood pressure medication every consistently.  No added salt. Will keep sodium content to 1,500  mg or less per day.      Generalized Obesity: Current BMI BMI (Calculated): 26.15   Pharmacotherapy Plan Continue  Mounjaro  12.5 mg SQ weekly  Suzanne Conner is currently in the action stage of change. As such, her goal is to continue with weight loss efforts.  She has agreed to the Category 1 plan.  Exercise goals: Older adults should follow the adult guidelines. When older adults cannot meet the adult guidelines, they should be as physically active as their abilities and conditions will allow.  She is doing chair exercises, and doing some weights.   Behavioral modification strategies: increasing lean protein intake, decreasing simple carbohydrates , meal planning , increase water intake, better snacking choices, planning for success, increasing vegetables, increasing fiber rich foods, avoiding temptations, keep healthy foods in the home, and mindful eating.  Suzanne Conner has agreed to follow-up with our clinic in 4 weeks.    Objective:   VITALS: Per patient if applicable, see vitals. GENERAL: Alert and in no acute  distress. CARDIOPULMONARY: No increased WOB. Speaking in clear sentences.  PSYCH: Pleasant and cooperative. Speech normal rate and rhythm. Affect is appropriate. Insight and judgement are appropriate. Attention is focused, linear, and appropriate.  NEURO: Oriented as arrived to appointment on time with no prompting.   Attestation Statements:   This was prepared with the assistance of Engineer, Civil (consulting).  Occasional wrong-word or sound-a-like substitutions may have occurred due to the inherent limitations of voice recognition.   Clayborne Daring, DO    "

## 2024-10-12 ENCOUNTER — Other Ambulatory Visit (HOSPITAL_COMMUNITY): Payer: Self-pay

## 2024-10-12 ENCOUNTER — Telehealth: Payer: Self-pay | Admitting: Pharmacy Technician

## 2024-10-13 ENCOUNTER — Other Ambulatory Visit: Payer: Self-pay

## 2024-10-13 ENCOUNTER — Encounter (HOSPITAL_COMMUNITY): Payer: Self-pay

## 2024-10-13 ENCOUNTER — Other Ambulatory Visit (HOSPITAL_COMMUNITY): Payer: Self-pay

## 2024-10-13 MED ORDER — NEBIVOLOL HCL 5 MG PO TABS
5.0000 mg | ORAL_TABLET | Freq: Every day | ORAL | 3 refills | Status: AC
  Filled 2024-10-13 (×2): qty 90, 90d supply, fill #0

## 2024-10-14 ENCOUNTER — Other Ambulatory Visit (HOSPITAL_COMMUNITY): Payer: Self-pay

## 2024-11-04 ENCOUNTER — Ambulatory Visit: Admitting: Bariatrics

## 2024-12-23 ENCOUNTER — Ambulatory Visit: Admitting: Family Medicine

## 2025-06-23 ENCOUNTER — Ambulatory Visit

## 2025-09-07 ENCOUNTER — Ambulatory Visit: Admitting: Podiatry
